# Patient Record
Sex: Female | Born: 1970 | Race: Black or African American | Hispanic: No | Marital: Married | Smoking: Never smoker
Health system: Southern US, Community
[De-identification: ages and names within clinical notes are randomized; demographics above are authoritative.]

## PROBLEM LIST (undated history)

## (undated) DIAGNOSIS — Z803 Family history of malignant neoplasm of breast: Secondary | ICD-10-CM

## (undated) DIAGNOSIS — Z801 Family history of malignant neoplasm of trachea, bronchus and lung: Secondary | ICD-10-CM

## (undated) DIAGNOSIS — Z923 Personal history of irradiation: Secondary | ICD-10-CM

## (undated) DIAGNOSIS — F909 Attention-deficit hyperactivity disorder, unspecified type: Secondary | ICD-10-CM

## (undated) DIAGNOSIS — F32A Depression, unspecified: Secondary | ICD-10-CM

## (undated) DIAGNOSIS — F329 Major depressive disorder, single episode, unspecified: Secondary | ICD-10-CM

## (undated) DIAGNOSIS — Z9221 Personal history of antineoplastic chemotherapy: Secondary | ICD-10-CM

## (undated) DIAGNOSIS — Z806 Family history of leukemia: Secondary | ICD-10-CM

## (undated) DIAGNOSIS — E119 Type 2 diabetes mellitus without complications: Secondary | ICD-10-CM

## (undated) DIAGNOSIS — J45909 Unspecified asthma, uncomplicated: Secondary | ICD-10-CM

## (undated) DIAGNOSIS — Z800 Family history of malignant neoplasm of digestive organs: Secondary | ICD-10-CM

## (undated) DIAGNOSIS — C801 Malignant (primary) neoplasm, unspecified: Secondary | ICD-10-CM

## (undated) HISTORY — DX: Type 2 diabetes mellitus without complications: E11.9

## (undated) HISTORY — PX: TUBAL LIGATION: SHX77

## (undated) HISTORY — DX: Family history of malignant neoplasm of digestive organs: Z80.0

## (undated) HISTORY — DX: Major depressive disorder, single episode, unspecified: F32.9

## (undated) HISTORY — DX: Family history of leukemia: Z80.6

## (undated) HISTORY — DX: Family history of malignant neoplasm of breast: Z80.3

## (undated) HISTORY — DX: Family history of malignant neoplasm of trachea, bronchus and lung: Z80.1

## (undated) HISTORY — DX: Depression, unspecified: F32.A

## (undated) HISTORY — DX: Attention-deficit hyperactivity disorder, unspecified type: F90.9

---

## 2011-08-11 ENCOUNTER — Encounter: Payer: Self-pay | Admitting: Family Medicine

## 2011-08-11 ENCOUNTER — Ambulatory Visit (INDEPENDENT_AMBULATORY_CARE_PROVIDER_SITE_OTHER): Payer: Managed Care, Other (non HMO) | Admitting: Family Medicine

## 2011-08-11 VITALS — BP 120/84 | HR 76 | Temp 98.5°F | Ht 64.0 in | Wt 158.8 lb

## 2011-08-11 DIAGNOSIS — F988 Other specified behavioral and emotional disorders with onset usually occurring in childhood and adolescence: Secondary | ICD-10-CM | POA: Insufficient documentation

## 2011-08-11 MED ORDER — METHYLPHENIDATE HCL ER (OSM) 18 MG PO TBCR: 18.0000 mg | EXTENDED_RELEASE_TABLET | ORAL | Status: DC

## 2011-08-11 MED ORDER — METHYLPHENIDATE HCL ER (OSM) 18 MG PO TBCR: 18.0000 mg | EXTENDED_RELEASE_TABLET | Freq: Every day | ORAL | Status: DC

## 2011-08-11 NOTE — Patient Instructions (Signed)
Attention Deficit Hyperactivity Disorder Attention deficit hyperactivity disorder (ADHD) is a problem with behavior issues based on the way the brain functions (neurobehavioral disorder). It is a common reason for behavior and academic problems in school. CAUSES  The cause of ADHD is unknown in most cases. It may run in families. It sometimes can be associated with learning disabilities and other behavioral problems. SYMPTOMS  There are 3 types of ADHD. The 3 types and some of the symptoms include:  Inattentive   Gets bored or distracted easily.   Loses or forgets things. Forgets to hand in homework.   Has trouble organizing or completing tasks.   Difficulty staying on task.   An inability to organize daily tasks and school work.   Leaving projects, chores, or homework unfinished.   Trouble paying attention or responding to details. Careless mistakes.   Difficulty following directions. Often seems like is not listening.   Dislikes activities that require sustained attention (like chores or homework).   Hyperactive-impulsive   Feels like it is impossible to sit still or stay in a seat. Fidgeting with hands and feet.   Trouble waiting turn.   Talking too much or out of turn. Interruptive.   Speaks or acts impulsively.   Aggressive, disruptive behavior.   Constantly busy or on the go, noisy.   Combined   Has symptoms of both of the above.  Often children with ADHD feel discouraged about themselves and with school. They often perform well below their abilities in school. These symptoms can cause problems in home, school, and in relationships with peers. As children get older, the excess motor activities can calm down, but the problems with paying attention and staying organized persist. Most children do not outgrow ADHD but with good treatment can learn to cope with the symptoms. DIAGNOSIS  When ADHD is suspected, the diagnosis should be made by professionals trained in  ADHD.  Diagnosis will include:  Ruling out other reasons for the child's behavior.   The caregivers will check with the child's school and check their medical records.   They will talk to teachers and parents.   Behavior rating scales for the child will be filled out by those dealing with the child on a daily basis.  A diagnosis is made only after all information has been considered. TREATMENT  Treatment usually includes behavioral treatment often along with medicines. It may include stimulant medicines. The stimulant medicines decrease impulsivity and hyperactivity and increase attention. Other medicines used include antidepressants and certain blood pressure medicines. Most experts agree that treatment for ADHD should address all aspects of the child's functioning. Treatment should not be limited to the use of medicines alone. Treatment should include structured classroom management. The parents must receive education to address rewarding good behavior, discipline, and limit-setting. Tutoring or behavioral therapy or both should be available for the child. If untreated, the disorder can have long-term serious effects into adolescence and adulthood. HOME CARE INSTRUCTIONS   Often with ADHD there is a lot of frustration among the family in dealing with the illness. There is often blame and anger that is not warranted. This is a life long illness. There is no way to prevent ADHD. In many cases, because the problem affects the family as a whole, the entire family may need help. A therapist can help the family find better ways to handle the disruptive behaviors and promote change. If the child is young, most of the therapist's work is with the parents. Parents will   learn techniques for coping with and improving their child's behavior. Sometimes only the child with the ADHD needs counseling. Your caregivers can help you make these decisions.   Children with ADHD may need help in organizing. Some  helpful tips include:   Keep routines the same every day from wake-up time to bedtime. Schedule everything. This includes homework and playtime. This should include outdoor and indoor recreation. Keep the schedule on the refrigerator or a bulletin board where it is frequently seen. Mark schedule changes as far in advance as possible.   Have a place for everything and keep everything in its place. This includes clothing, backpacks, and school supplies.   Encourage writing down assignments and bringing home needed books.   Offer your child a well-balanced diet. Breakfast is especially important for school performance. Children should avoid drinks with caffeine including:   Soft drinks.   Coffee.   Tea.   However, some older children (adolescents) may find these drinks helpful in improving their attention.   Children with ADHD need consistent rules that they can understand and follow. If rules are followed, give small rewards. Children with ADHD often receive, and expect, criticism. Look for good behavior and praise it. Set realistic goals. Give clear instructions. Look for activities that can foster success and self-esteem. Make time for pleasant activities with your child. Give lots of affection.   Parents are their children's greatest advocates. Learn as much as possible about ADHD. This helps you become a stronger and better advocate for your child. It also helps you educate your child's teachers and instructors if they feel inadequate in these areas. Parent support groups are often helpful. A national group with local chapters is called CHADD (Children and Adults with Attention Deficit Hyperactivity Disorder).  PROGNOSIS  There is no cure for ADHD. Children with the disorder seldom outgrow it. Many find adaptive ways to accommodate the ADHD as they mature. SEEK MEDICAL CARE IF:  Your child has repeated muscle twitches, cough or speech outbursts.   Your child has sleep problems.   Your  child has a marked loss of appetite.   Your child develops depression.   Your child has new or worsening behavioral problems.   Your child develops dizziness.   Your child has a racing heart.   Your child has stomach pains.   Your child develops headaches.  Document Released: 09/19/2002 Document Revised: 06/11/2011 Document Reviewed: 05/01/2008 Exit[employer] Patient Information 2012 [employer]. 

## 2011-08-11 NOTE — Assessment & Plan Note (Signed)
meds refilled rto 6 months for f/u and cpe

## 2011-08-11 NOTE — Progress Notes (Signed)
  Subjective:    Patient ID: Maria Kennedy, female    DOB: Feb 04, 1971, 40 y.o.   MRN: [id number]  HPI  Pt here to establish and get refills on meds.  No complaints.  Pt diagnosed with ADD in [place] and recently moved here.   Review of Systems As above    Objective:   Physical Exam  Constitutional: She is oriented to person, place, and time. She appears well-developed and well-nourished.  Cardiovascular: Normal rate, regular rhythm and normal heart sounds.   Pulmonary/Chest: Effort normal and breath sounds normal.  Neurological: She is alert and oriented to person, place, and time.  Psychiatric: She has a normal mood and affect. Her behavior is normal. Judgment and thought content normal.          Assessment & Plan:

## 2012-02-11 ENCOUNTER — Ambulatory Visit: Payer: Managed Care, Other (non HMO) | Admitting: Family Medicine

## 2012-02-11 DIAGNOSIS — Z0289 Encounter for other administrative examinations: Secondary | ICD-10-CM

## 2013-08-25 ENCOUNTER — Other Ambulatory Visit: Payer: Self-pay

## 2013-08-25 ENCOUNTER — Other Ambulatory Visit (HOSPITAL_COMMUNITY)
Admission: RE | Admit: 2013-08-25 | Discharge: 2013-08-25 | Disposition: A | Discharge status: Home / Self Care | Payer: Managed Care, Other (non HMO) | Source: Ambulatory Visit | Attending: Family Medicine | Admitting: Family Medicine

## 2013-08-25 DIAGNOSIS — Z01419 Encounter for gynecological examination (general) (routine) without abnormal findings: Secondary | ICD-10-CM | POA: Insufficient documentation

## 2018-11-03 ENCOUNTER — Other Ambulatory Visit: Payer: Self-pay | Admitting: Family Medicine

## 2018-11-03 ENCOUNTER — Other Ambulatory Visit (HOSPITAL_COMMUNITY)
Admission: RE | Admit: 2018-11-03 | Discharge: 2018-11-03 | Disposition: A | Discharge status: Home / Self Care | Payer: Managed Care, Other (non HMO) | Source: Ambulatory Visit | Attending: Family Medicine | Admitting: Family Medicine

## 2018-11-03 DIAGNOSIS — Z01411 Encounter for gynecological examination (general) (routine) with abnormal findings: Secondary | ICD-10-CM | POA: Insufficient documentation

## 2018-11-08 LAB — CYTOLOGY - PAP
Diagnosis: NEGATIVE
HPV: NOT DETECTED

## 2018-11-15 ENCOUNTER — Telehealth: Payer: Self-pay | Admitting: Neurology

## 2018-11-15 ENCOUNTER — Ambulatory Visit (INDEPENDENT_AMBULATORY_CARE_PROVIDER_SITE_OTHER): Payer: Managed Care, Other (non HMO) | Admitting: Neurology

## 2018-11-15 ENCOUNTER — Encounter: Payer: Self-pay | Admitting: Neurology

## 2018-11-15 VITALS — BP 123/80 | HR 77 | Ht 63.5 in | Wt 168.0 lb

## 2018-11-15 DIAGNOSIS — R200 Anesthesia of skin: Secondary | ICD-10-CM | POA: Diagnosis not present

## 2018-11-15 DIAGNOSIS — R42 Dizziness and giddiness: Secondary | ICD-10-CM | POA: Diagnosis not present

## 2018-11-15 DIAGNOSIS — H814 Vertigo of central origin: Secondary | ICD-10-CM

## 2018-11-15 DIAGNOSIS — H93A9 Pulsatile tinnitus, unspecified ear: Secondary | ICD-10-CM

## 2018-11-15 DIAGNOSIS — R269 Unspecified abnormalities of gait and mobility: Secondary | ICD-10-CM

## 2018-11-15 DIAGNOSIS — H8112 Benign paroxysmal vertigo, left ear: Secondary | ICD-10-CM | POA: Diagnosis not present

## 2018-11-15 DIAGNOSIS — Z8249 Family history of ischemic heart disease and other diseases of the circulatory system: Secondary | ICD-10-CM

## 2018-11-15 DIAGNOSIS — R202 Paresthesia of skin: Secondary | ICD-10-CM

## 2018-11-15 DIAGNOSIS — G441 Vascular headache, not elsewhere classified: Secondary | ICD-10-CM

## 2018-11-15 NOTE — Progress Notes (Signed)
[facility]    Provider:  Dr Jaynee Eagles Referring Provider: London Pepper, MD Primary Care Provider:  London Pepper, MD  CC:  Vertigo  HPI:  Maria Kennedy is a 48 y.o. female here as requested by provider Carollee Herter, Alferd Apa, * for positional vertigo. PMHx bilateral CTS and ADHD. The symptoms started 6 months ago, feeling dizzy and leabning to the side. Worse at night when turning to the left, world spins, brief, stopping movement makes it improve. Happens every night. 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 She was given exercises but she has not been doing them. She has an occ headaches not often, more migraines get better with a dark room, nothing significant a few times a year likely migraines. No nausea or vomiting, no falls, she has numbness and tingling on the left side possibly CTS. She had a brother who dies instantly of a burst cerebral aneurysm, he was having headaches. Patient reports pulsatile tinnitus of the left ear as well. She is scared because all the symptoms are happening on the left side. No other focal neurologic deficits, associated symptoms, inciting events or modifiable factors.  Reviewed notes, labs and imaging from outside physicians, which showed: Reviewed notes from a Dr. Orland Mustard, patient has carpal tunnel syndrome treated with prednisone for 5 days and wrist braces, she has numbness in the left hand, less neck pain than pain in the hand.  She is also having positional vertigo, dizziness is worse at nights when she lays on her left side.  Is going on for over 6 months.  Has not had any vestibular therapy or seen an ENT.  Dizziness can occur at any time, worse at night.  When she lay down suspends, ongoing for 6 months, lasts a few seconds, dizziness and spinning.  She also has a family history of aneurysm.`  She had bloodwork at Dr. Darien Ramus office I don;t have thos eresults I will request.  Review of Systems: Patient complains of symptoms per HPI  as well as the following symptoms: dizziness. Pertinent negatives and positives per HPI. All others negative.   Social History   Socioeconomic History  . Marital status: Married    Spouse name: Not on file  . Number of children: 2  . Years of education: Not on file  . Highest education level: Not on file  Occupational History  . Occupation: Human resources officer: [employer]  . Financial resource strain: Not on file  . Food insecurity:    Worry: Not on file    Inability: Not on file  . Transportation needs:    Medical: Not on file    Non-medical: Not on file  Tobacco Use  . Smoking status: Never Smoker  . Smokeless tobacco: Never Used  Substance and Sexual Activity  . Alcohol use: Yes    Comment: 1 beer 2-3 times per month  . Drug use: Never  . Sexual activity: Not Currently    Partners: Male    Birth control/protection: Surgical  Lifestyle  . Physical activity:    Days per week: Not on file    Minutes per session: Not on file  . Stress: Not on file  Relationships  . Social connections:    Talks on phone: Not on file    Gets together: Not on file    Attends religious service: Not on file    Active member of club or organization: Not on file    Attends meetings  of clubs or organizations: Not on file    Relationship status: Not on file  . Intimate partner violence:    Fear of current or ex partner: Not on file    Emotionally abused: Not on file    Physically abused: Not on file    Forced sexual activity: Not on file  Other Topics Concern  . Not on file  Social History Narrative   Lives at home with husband and child   Left handed   Caffeine: 1 cup of coffee in the mornings     Family History  Problem Relation Age of Onset  . Hypertension Mother   . Diabetes Mother   . Pneumonia Mother   . Lung cancer Father        smoker  . Other Father        vertigo  . Pancreatic cancer Paternal Grandmother   . Hypertension Maternal Grandmother     . Leukemia Maternal Grandfather   . Breast cancer Maternal Aunt   . Prostate cancer Paternal Uncle   . Aneurysm Brother 21       DECEASED    Past Medical History:  Diagnosis Date  . ADHD (attention deficit hyperactivity disorder)   . Depression     Patient Active Problem List   Diagnosis Date Noted  . Benign paroxysmal positional vertigo of left ear 11/15/2018  . ADD (attention deficit disorder) 08/11/2011    Past Surgical History:  Procedure Laterality Date  . TUBAL LIGATION      Current Outpatient Medications  Medication Sig Dispense Refill  . amphetamine-dextroamphetamine (ADDERALL XR) 20 MG 24 hr capsule Take 20 mg by mouth daily as needed.    Marland Kitchen VITAMIN D PO Take 5,000 Int'l Units by mouth every other day.     No current facility-administered medications for this visit.     Allergies as of 11/15/2018  . (No Known Allergies)    Vitals: BP 123/80 (BP Location: Right Arm, Patient Position: Sitting)   Pulse 77   Ht 5' 3.5" (1.613 m)   Wt 168 lb (76.2 kg)   BMI 29.29 kg/m  Last Weight:  Wt Readings from Last 1 Encounters:  11/15/18 168 lb (76.2 kg)   Last Height:   Ht Readings from Last 1 Encounters:  11/15/18 5' 3.5" (1.613 m)     Physical exam: Exam: Gen: NAD, conversant, well nourised, well groomed                     CV: RRR, no MRG. No Carotid Bruits. No peripheral edema, warm, nontender Eyes: Conjunctivae clear without exudates or hemorrhage  Neuro: Detailed Neurologic Exam  Speech:    Speech is normal; fluent and spontaneous with normal comprehension.  Cognition:    The patient is oriented to person, place, and time;     recent and remote memory intact;     language fluent;     normal attention, concentration,     fund of knowledge Cranial Nerves:    The pupils are equal, round, and reactive to light. The fundi are normal and spontaneous venous pulsations are present. Visual fields are full to finger confrontation. Extraocular movements  are intact. Trigeminal sensation is intact and the muscles of mastication are normal. The face is symmetric. The palate elevates in the midline. Hearing intact. Voice is normal. Shoulder shrug is normal. The tongue has normal motion without fasciculations.   Coordination:    Normal finger to nose and heel to shin. Normal  rapid alternating movements.   Gait:    Heel-toe and tandem gait are normal.   Motor Observation:    No asymmetry, no atrophy, and no involuntary movements noted. Tone:    Normal muscle tone.    Posture:    Posture is normal. normal erect    Strength:    Strength is V/V in the upper and lower limbs.      Sensation: intact to LT     Reflex Exam:  DTR's:    Deep tendon reflexes in the upper and lower extremities are normal bilaterally.   Toes:    The toes are downgoing bilaterally.   Clonus:    Clonus is absent.    Assessment/Plan: 48 year old with likely BPPV however given symptoms such as arm numbness, left pulsatile tinnitus, hx of brother who dies of burst cerebral aneurysm, vertigo need MRI of the brain w/wo contrast to evaluate for other causes such as aneurysm, vestibular schwannoma, stroke, or other lesions or vascular etiologies.  Orders Placed This Encounter  Procedures  . MR BRAIN W WO CONTRAST    Standing Status:   Future    Standing Expiration Date:   05/16/2019    Order Specific Question:   If indicated for the ordered procedure, I authorize the administration of contrast media per Radiology protocol    Answer:   Yes    Order Specific Question:   What is the patient's sedation requirement?    Answer:   No Sedation    Order Specific Question:   Does the patient have a pacemaker or implanted devices?    Answer:   No    Order Specific Question:   Radiology Contrast Protocol - do NOT remove file path    Answer:   \\[email or web address]    Order Specific Question:   Preferred imaging location?    Answer:   External  . MR MRA  HEAD WO CONTRAST    Standing Status:   Future    Standing Expiration Date:   01/14/2020    Order Specific Question:   What is the patient's sedation requirement?    Answer:   No Sedation    Order Specific Question:   Does the patient have a pacemaker or implanted devices?    Answer:   No    Order Specific Question:   Preferred imaging location?    Answer:   External    Order Specific Question:   Radiology Contrast Protocol - do NOT remove file path    Answer:   \\[email or web address]  . Ambulatory referral to Physical Therapy    Referral Priority:   Routine    Referral Type:   Physical Medicine    Referral Reason:   Specialty Services Required    Requested Specialty:   Physical Therapy    Number of Visits Requested:   1     Cc: London Pepper, MD  Sarina Ill, MD  [facility] [address] [place], [place] [address]  Phone [phone] Fax [phone]

## 2018-11-15 NOTE — Patient Instructions (Addendum)
Physical Therapy for Vestibular Therapy MRI of the brain and the blood vessels of the head   Benign Positional Vertigo Vertigo is the feeling that you or your surroundings are moving when they are not. Benign positional vertigo is the most common form of vertigo. This is usually a harmless condition (benign). This condition is positional. This means that symptoms are triggered by certain movements and positions. This condition can be dangerous if it occurs while you are doing something that could cause harm to you or others. This includes activities such as driving or operating machinery. What are the causes? In many cases, the cause of this condition is not known. It may be caused by a disturbance in an area of the inner ear that helps your brain to sense movement and balance. This disturbance can be caused by:  Viral infection (labyrinthitis).  Head injury.  Repetitive motion, such as jumping, dancing, or running. What increases the risk? You are more likely to develop this condition if:  You are a woman.  You are 60 years of age or older. What are the signs or symptoms? Symptoms of this condition usually happen when you move your head or your eyes in different directions. Symptoms may start suddenly, and usually last for less than a minute. They include:  Loss of balance and falling.  Feeling like you are spinning or moving.  Feeling like your surroundings are spinning or moving.  Nausea and vomiting.  Blurred vision.  Dizziness.  Involuntary eye movement (nystagmus). Symptoms can be mild and cause only minor problems, or they can be severe and interfere with daily life. Episodes of benign positional vertigo may return (recur) over time. Symptoms may improve over time. How is this diagnosed? This condition may be diagnosed based on:  Your medical history.  Physical exam of the head, neck, and ears.  Tests, such as: ? MRI. ? CT scan. ? Eye movement tests. Your health  care provider may ask you to change positions quickly while he or she watches you for symptoms of benign positional vertigo, such as nystagmus. Eye movement may be tested with a variety of exams that are designed to evaluate or stimulate vertigo. ? An electroencephalogram (EEG). This records electrical activity in your brain. ? Hearing tests. You may be referred to a health care provider who specializes in ear, nose, and throat (ENT) problems (otolaryngologist) or a provider who specializes in disorders of the nervous system (neurologist). How is this treated?  This condition may be treated in a session in which your health care provider moves your head in specific positions to adjust your inner ear back to normal. Treatment for this condition may take several sessions. Surgery may be needed in severe cases, but this is rare. In some cases, benign positional vertigo may resolve on its own in 2-4 weeks. Follow these instructions at home: Safety  Move slowly. Avoid sudden body or head movements or certain positions, as told by your health care provider.  Avoid driving until your health care provider says it is safe for you to do so.  Avoid operating heavy machinery until your health care provider says it is safe for you to do so.  Avoid doing any tasks that would be dangerous to you or others if vertigo occurs.  If you have trouble walking or keeping your balance, try using a cane for stability. If you feel dizzy or unstable, sit down right away.  Return to your normal activities as told by your health  care provider. Ask your health care provider what activities are safe for you. General instructions  Take over-the-counter and prescription medicines only as told by your health care provider.  Drink enough fluid to keep your urine pale yellow.  Keep all follow-up visits as told by your health care provider. This is important. Contact a health care provider if:  You have a fever.  Your  condition gets worse or you develop new symptoms.  Your family or friends notice any behavioral changes.  You have nausea or vomiting that gets worse.  You have numbness or a "pins and needles" sensation. Get help right away if you:  Have difficulty speaking or moving.  Are always dizzy.  Faint.  Develop severe headaches.  Have weakness in your legs or arms.  Have changes in your hearing or vision.  Develop a stiff neck.  Develop sensitivity to light. Summary  Vertigo is the feeling that you or your surroundings are moving when they are not. Benign positional vertigo is the most common form of vertigo.  The cause of this condition is not known. It may be caused by a disturbance in an area of the inner ear that helps your brain to sense movement and balance.  Symptoms include loss of balance and falling, feeling that you or your surroundings are moving, nausea and vomiting, and blurred vision.  This condition can be diagnosed based on symptoms, physical exam, and other tests, such as MRI, CT scan, eye movement tests, and hearing tests.  Follow safety instructions as told by your health care provider. You will also be told when to contact your health care provider in case of problems. This information is not intended to replace advice given to you by your health care provider. Make sure you discuss any questions you have with your health care provider. Document Released: 07/07/2006 Document Revised: 03/10/2018 Document Reviewed: 03/10/2018 Elsevier Interactive Patient Education  2019 [employer].

## 2018-11-15 NOTE — Telephone Encounter (Signed)
[employer] order sent to [facility] they will obtain the auth and reach out to the pt to schedule. Pt is aware

## 2018-12-02 ENCOUNTER — Inpatient Hospital Stay: Admission: RE | Admit: 2018-12-02 | Payer: Managed Care, Other (non HMO) | Source: Ambulatory Visit

## 2018-12-02 ENCOUNTER — Other Ambulatory Visit: Payer: Managed Care, Other (non HMO)

## 2018-12-06 NOTE — Telephone Encounter (Signed)
Novella Rob: [id number] (exp. 11/29/18 to 02/27/19) patient is scheduled at [facility] for 12/16/18

## 2018-12-16 ENCOUNTER — Ambulatory Visit
Admission: RE | Admit: 2018-12-16 | Discharge: 2018-12-16 | Disposition: A | Discharge status: Home / Self Care | Payer: Managed Care, Other (non HMO) | Source: Ambulatory Visit | Attending: Neurology | Admitting: Neurology

## 2018-12-16 DIAGNOSIS — R200 Anesthesia of skin: Secondary | ICD-10-CM | POA: Diagnosis not present

## 2018-12-16 DIAGNOSIS — Z8249 Family history of ischemic heart disease and other diseases of the circulatory system: Secondary | ICD-10-CM

## 2018-12-16 DIAGNOSIS — G441 Vascular headache, not elsewhere classified: Secondary | ICD-10-CM

## 2018-12-16 DIAGNOSIS — H93A9 Pulsatile tinnitus, unspecified ear: Secondary | ICD-10-CM

## 2018-12-16 DIAGNOSIS — R269 Unspecified abnormalities of gait and mobility: Secondary | ICD-10-CM

## 2018-12-16 DIAGNOSIS — R42 Dizziness and giddiness: Secondary | ICD-10-CM

## 2018-12-16 DIAGNOSIS — R202 Paresthesia of skin: Secondary | ICD-10-CM

## 2018-12-16 DIAGNOSIS — H814 Vertigo of central origin: Secondary | ICD-10-CM

## 2018-12-16 MED ORDER — GADOBENATE DIMEGLUMINE 529 MG/ML IV SOLN
15.0000 mL | Freq: Once | INTRAVENOUS | Status: AC | PRN
  Administered 2018-12-16: 15 mL via INTRAVENOUS

## 2018-12-17 ENCOUNTER — Telehealth: Payer: Self-pay | Admitting: Neurology

## 2018-12-17 NOTE — Telephone Encounter (Signed)
Called the patient but there was no answer. Unable to leave a message due to no voicemail set up. Will attempt to call the patient again.  **If patient calls back please advise that we were calling to let her know that Dr Jaynee Eagles reviewed her MRI and MRA of her head and both were normal and didn't show anything concerning.

## 2018-12-17 NOTE — Telephone Encounter (Signed)
-----   Message from Melvenia Beam, MD sent at 12/16/2018  4:28 PM EST ----- MRI of the brain and MRA of the head are normal. Thanks

## 2018-12-17 NOTE — Telephone Encounter (Signed)
Patient returned call and was advised of MRI and MRA

## 2018-12-31 ENCOUNTER — Ambulatory Visit: Payer: Managed Care, Other (non HMO) | Admitting: Rehabilitative and Restorative Service Providers"

## 2019-09-07 ENCOUNTER — Other Ambulatory Visit: Payer: Self-pay

## 2019-09-07 DIAGNOSIS — Z20822 Contact with and (suspected) exposure to covid-19: Secondary | ICD-10-CM

## 2019-09-09 LAB — NOVEL CORONAVIRUS, NAA: SARS-CoV-2, NAA: DETECTED — AB

## 2019-09-12 ENCOUNTER — Encounter: Payer: Self-pay | Admitting: *Deleted

## 2019-09-20 ENCOUNTER — Other Ambulatory Visit: Payer: Self-pay

## 2019-09-20 DIAGNOSIS — Z20822 Contact with and (suspected) exposure to covid-19: Secondary | ICD-10-CM

## 2019-09-22 LAB — NOVEL CORONAVIRUS, NAA: SARS-CoV-2, NAA: NOT DETECTED

## 2019-09-26 ENCOUNTER — Other Ambulatory Visit: Payer: Self-pay | Admitting: Family Medicine

## 2019-09-26 ENCOUNTER — Other Ambulatory Visit: Payer: Self-pay

## 2019-09-26 ENCOUNTER — Ambulatory Visit
Admission: RE | Admit: 2019-09-26 | Discharge: 2019-09-26 | Disposition: A | Discharge status: Home / Self Care | Payer: Managed Care, Other (non HMO) | Source: Ambulatory Visit | Attending: Family Medicine | Admitting: Family Medicine

## 2019-09-26 DIAGNOSIS — R079 Chest pain, unspecified: Secondary | ICD-10-CM

## 2019-11-21 ENCOUNTER — Other Ambulatory Visit: Payer: Self-pay | Admitting: Family Medicine

## 2019-11-21 DIAGNOSIS — Z1231 Encounter for screening mammogram for malignant neoplasm of breast: Secondary | ICD-10-CM

## 2019-11-23 ENCOUNTER — Encounter: Payer: Self-pay | Admitting: Pulmonary Disease

## 2019-11-23 ENCOUNTER — Ambulatory Visit (INDEPENDENT_AMBULATORY_CARE_PROVIDER_SITE_OTHER): Payer: Managed Care, Other (non HMO)

## 2019-11-23 ENCOUNTER — Other Ambulatory Visit: Payer: Self-pay

## 2019-11-23 ENCOUNTER — Ambulatory Visit (INDEPENDENT_AMBULATORY_CARE_PROVIDER_SITE_OTHER): Payer: 59 | Admitting: Pulmonary Disease

## 2019-11-23 VITALS — BP 124/84 | HR 96 | Temp 97.0°F | Ht 63.5 in | Wt 175.2 lb

## 2019-11-23 DIAGNOSIS — R0602 Shortness of breath: Secondary | ICD-10-CM

## 2019-11-23 MED ORDER — BENZONATATE 200 MG PO CAPS: 200.0000 mg | ORAL_CAPSULE | Freq: Three times a day (TID) | ORAL | 1 refills | Status: DC | PRN

## 2019-11-23 NOTE — Patient Instructions (Signed)
Recent Covid  Bronchitis  Your chest x-ray is completely normal which is reassuring  Symptoms should continue to improve and resolve in a few weeks  Continue using albuterol as needed I did send you in a prescription for benzonatate -If this is not working for you then using Mucinex/guaifenesin will be appropriate  Call if significant symptoms  We will see you as needed

## 2019-11-23 NOTE — Progress Notes (Signed)
Subjective:    Patient ID: Maria Kennedy, female    DOB: Sep 24, 1971, 49 y.o.   MRN: [id number]  Patient being seen for shortness of breath  She was diagnosed with Covid 09/10/2019  She did not get significantly sick Did not require hospitalization  Shortness of breath with cough, sputum production, wheezing Symptoms improved with use of albuterol  She has noticed some improvement in symptoms  Prior to the visit today we did get a chest x-ray which was compared to one performed in December 2020-no significant changes Lung volumes are large-used to do track  Has no underlying lung disease known to, no history of asthma  Past Medical History:  Diagnosis Date  . ADHD (attention deficit hyperactivity disorder)   . Depression    Social History   Socioeconomic History  . Marital status: Married    Spouse name: Not on file  . Number of children: 2  . Years of education: Not on file  . Highest education level: Not on file  Occupational History  . Occupation: Human resources officer: [employer]  Tobacco Use  . Smoking status: Never Smoker  . Smokeless tobacco: Never Used  Substance and Sexual Activity  . Alcohol use: Yes    Comment: 1 beer 2-3 times per month  . Drug use: Never  . Sexual activity: Not Currently    Partners: Male    Birth control/protection: Surgical  Other Topics Concern  . Not on file  Social History Narrative   Lives at home with husband and child   Left handed   Caffeine: 1 cup of coffee in the mornings    Social Determinants of Health   Financial Resource Strain:   . Difficulty of Paying Living Expenses: Not on file  Food Insecurity:   . Worried About Charity fundraiser in the Last Year: Not on file  . Ran Out of Food in the Last Year: Not on file  Transportation Needs:   . Lack of Transportation (Medical): Not on file  . Lack of Transportation (Non-Medical): Not on file  Physical Activity:   . Days of Exercise per Week: Not on file    . Minutes of Exercise per Session: Not on file  Stress:   . Feeling of Stress : Not on file  Social Connections:   . Frequency of Communication with Friends and Family: Not on file  . Frequency of Social Gatherings with Friends and Family: Not on file  . Attends Religious Services: Not on file  . Active Member of Clubs or Organizations: Not on file  . Attends Archivist Meetings: Not on file  . Marital Status: Not on file  Intimate Partner Violence:   . Fear of Current or Ex-Partner: Not on file  . Emotionally Abused: Not on file  . Physically Abused: Not on file  . Sexually Abused: Not on file   Family History  Problem Relation Age of Onset  . Hypertension Mother   . Diabetes Mother   . Pneumonia Mother   . Lung cancer Father        smoker  . Other Father        vertigo  . Pancreatic cancer Paternal Grandmother   . Hypertension Maternal Grandmother   . Leukemia Maternal Grandfather   . Breast cancer Maternal Aunt   . Prostate cancer Paternal Uncle   . Aneurysm Brother 21       DECEASED   Review of Systems  Constitutional: Negative for  fever and unexpected weight change.  HENT: Positive for congestion and rhinorrhea. Negative for dental problem, ear pain, nosebleeds, postnasal drip, sinus pressure, sneezing, sore throat and trouble swallowing.   Eyes: Positive for itching. Negative for redness.  Respiratory: Positive for cough, chest tightness, shortness of breath and wheezing.   Cardiovascular: Negative for palpitations and leg swelling.  Gastrointestinal: Negative for nausea and vomiting.  Genitourinary: Negative for dysuria.  Musculoskeletal: Negative for joint swelling.  Skin: Negative for rash.  Allergic/Immunologic: Negative.  Negative for environmental allergies, food allergies and immunocompromised state.  Neurological: Positive for headaches.  Hematological: Does not bruise/bleed easily.  Psychiatric/Behavioral: Positive for dysphoric mood. The  patient is nervous/anxious.        Objective:   Physical Exam Constitutional:      Appearance: She is obese.  HENT:     Head: Normocephalic.     Nose: Nose normal.     Mouth/Throat:     Mouth: Mucous membranes are moist.     Pharynx: No oropharyngeal exudate.  Eyes:     Extraocular Movements: Extraocular movements intact.     Pupils: Pupils are equal, round, and reactive to light.  Cardiovascular:     Rate and Rhythm: Normal rate and regular rhythm.     Pulses: Normal pulses.     Heart sounds: Normal heart sounds. No murmur. No friction rub.  Pulmonary:     Effort: Pulmonary effort is normal. No respiratory distress.     Breath sounds: Normal breath sounds. No stridor. No wheezing or rhonchi.  Musculoskeletal:     Cervical back: Normal range of motion and neck supple. No rigidity or tenderness.  Skin:    General: Skin is warm.  Neurological:     General: No focal deficit present.     Mental Status: She is alert.  Psychiatric:        Mood and Affect: Mood normal.    Vitals:   11/23/19 1018  BP: 124/84  Pulse: 96  Temp: (!) 97 F (36.1 C)  SpO2: 99%      Assessment & Plan:  .  Shortness of breath  .  Post recent Covid infection  .  Bronchitis  Appears to be feeling better generally Albuterol seems to help symptoms  Plan .  Continue albuterol use as needed  .  Graded exercise get back to usual  .  Chest x-ray performed today was reviewed with the patient showing no new infiltrative process, comparable with previous x-ray performed in December  .  Reassurance  .  PFT and further work-up if nonresolution of symptoms after a few weeks  .  Encouraged to call with any significant concerns  .  We will only see as needed

## 2020-01-02 ENCOUNTER — Ambulatory Visit: Payer: Managed Care, Other (non HMO)

## 2020-03-30 ENCOUNTER — Other Ambulatory Visit: Payer: Self-pay

## 2020-03-30 ENCOUNTER — Ambulatory Visit: Payer: 59

## 2020-03-30 ENCOUNTER — Ambulatory Visit
Admission: RE | Admit: 2020-03-30 | Discharge: 2020-03-30 | Disposition: A | Discharge status: Home / Self Care | Payer: 59 | Source: Ambulatory Visit | Attending: Family Medicine | Admitting: Family Medicine

## 2020-03-30 DIAGNOSIS — Z1231 Encounter for screening mammogram for malignant neoplasm of breast: Secondary | ICD-10-CM

## 2020-10-13 HISTORY — PX: MASTECTOMY: SHX3

## 2020-12-03 ENCOUNTER — Other Ambulatory Visit: Payer: Self-pay | Admitting: Family Medicine

## 2020-12-03 DIAGNOSIS — N63 Unspecified lump in unspecified breast: Secondary | ICD-10-CM

## 2020-12-12 ENCOUNTER — Other Ambulatory Visit: Payer: Self-pay

## 2020-12-12 ENCOUNTER — Ambulatory Visit
Admission: RE | Admit: 2020-12-12 | Discharge: 2020-12-12 | Disposition: A | Discharge status: Home / Self Care | Payer: 59 | Source: Ambulatory Visit | Attending: Family Medicine | Admitting: Family Medicine

## 2020-12-12 ENCOUNTER — Other Ambulatory Visit: Payer: Self-pay | Admitting: Family Medicine

## 2020-12-12 DIAGNOSIS — N63 Unspecified lump in unspecified breast: Secondary | ICD-10-CM

## 2020-12-17 ENCOUNTER — Ambulatory Visit
Admission: RE | Admit: 2020-12-17 | Discharge: 2020-12-17 | Disposition: A | Discharge status: Home / Self Care | Payer: 59 | Source: Ambulatory Visit | Attending: Family Medicine | Admitting: Family Medicine

## 2020-12-17 ENCOUNTER — Other Ambulatory Visit: Payer: Self-pay

## 2020-12-17 DIAGNOSIS — N63 Unspecified lump in unspecified breast: Secondary | ICD-10-CM

## 2020-12-18 ENCOUNTER — Other Ambulatory Visit: Payer: Self-pay | Admitting: Family Medicine

## 2020-12-18 DIAGNOSIS — R2232 Localized swelling, mass and lump, left upper limb: Secondary | ICD-10-CM

## 2020-12-18 DIAGNOSIS — Z853 Personal history of malignant neoplasm of breast: Secondary | ICD-10-CM

## 2020-12-19 ENCOUNTER — Ambulatory Visit
Admission: RE | Admit: 2020-12-19 | Discharge: 2020-12-19 | Disposition: A | Discharge status: Home / Self Care | Payer: 59 | Source: Ambulatory Visit | Attending: Family Medicine | Admitting: Family Medicine

## 2020-12-19 ENCOUNTER — Other Ambulatory Visit: Payer: Self-pay

## 2020-12-19 ENCOUNTER — Other Ambulatory Visit: Payer: Self-pay | Admitting: Nurse Practitioner

## 2020-12-19 ENCOUNTER — Telehealth: Payer: Self-pay | Admitting: Hematology and Oncology

## 2020-12-19 DIAGNOSIS — Z853 Personal history of malignant neoplasm of breast: Secondary | ICD-10-CM

## 2020-12-19 DIAGNOSIS — C50919 Malignant neoplasm of unspecified site of unspecified female breast: Secondary | ICD-10-CM

## 2020-12-19 DIAGNOSIS — R2232 Localized swelling, mass and lump, left upper limb: Secondary | ICD-10-CM

## 2020-12-19 NOTE — Telephone Encounter (Signed)
Spoke to patient to confirm afternoon [facility] appointment for 3/16, packet emailed to patient

## 2020-12-20 ENCOUNTER — Encounter: Payer: Self-pay | Admitting: *Deleted

## 2020-12-23 ENCOUNTER — Ambulatory Visit
Admission: RE | Admit: 2020-12-23 | Discharge: 2020-12-23 | Disposition: A | Discharge status: Home / Self Care | Payer: 59 | Source: Ambulatory Visit | Attending: Nurse Practitioner | Admitting: Nurse Practitioner

## 2020-12-23 DIAGNOSIS — C50919 Malignant neoplasm of unspecified site of unspecified female breast: Secondary | ICD-10-CM

## 2020-12-23 MED ORDER — GADOBUTROL 1 MMOL/ML IV SOLN
7.0000 mL | Freq: Once | INTRAVENOUS | Status: AC | PRN
  Administered 2020-12-23: 7 mL via INTRAVENOUS

## 2020-12-24 ENCOUNTER — Other Ambulatory Visit: Payer: Self-pay | Admitting: *Deleted

## 2020-12-24 DIAGNOSIS — C50412 Malignant neoplasm of upper-outer quadrant of left female breast: Secondary | ICD-10-CM | POA: Insufficient documentation

## 2020-12-24 DIAGNOSIS — Z171 Estrogen receptor negative status [ER-]: Secondary | ICD-10-CM

## 2020-12-25 NOTE — Progress Notes (Signed)
[facility] NOTE  Patient Care Team: London Pepper, MD as PCP - General (Family Medicine) Mauro Kaufmann, RN as Oncology Nurse Navigator Rockwell Germany, RN as Oncology Nurse Navigator Rolm Bookbinder, MD as Consulting Physician (General Surgery) Nicholas Lose, MD as Consulting Physician (Hematology and Oncology) Eppie Gibson, MD as Attending Physician (Radiation Oncology)  CHIEF COMPLAINTS/PURPOSE OF CONSULTATION:  Newly diagnosed breast cancer  HISTORY OF PRESENTING ILLNESS:  Maria Kennedy 50 y.o. female is here because of recent diagnosis of invasive ductal carcinoma of the left breast. Patient palpated a left breast mass and noted left axilla tenderness for one week. Diagnostic mammogram and [place] on 12/12/20 showed a conglomeration of masses in the left breast at the 2 o'clock position 3-4cm from the nipple spanning 3.9cm total, with a 0.7cm mass at the 2 o'clock position 6cm from the nipple, and two abnormal axillary lymph nodes. 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 She presents to the clinic today for initial evaluation and discussion of treatment options.   I reviewed her records extensively and collaborated the history with the patient.  SUMMARY OF ONCOLOGIC HISTORY: Oncology History  Malignant neoplasm of upper-outer quadrant of left breast in female, estrogen receptor negative ([place])  12/19/2020 Initial Diagnosis   Patient palpated a left breast mass and noted left axilla tenderness for one week. Diagnostic mammogram and US showed a conglomeration of masses in the left breast at the 2 o'clock position 3-4cm from the nipple spanning 3.9cm total, with a 0.7cm mass at the 2 o'clock position 6cm from the nipple, and two abnormal axillary lymph nodes. 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   12/26/2020 Cancer  Staging   Staging form: Breast, AJCC 8th Edition - Clinical stage from 12/26/2020: Stage IIIA (cT3, cN1, cM0, G3, ER-, PR-, HER2+) - Signed by Nicholas Lose, MD on 12/26/2020 Stage prefix: Initial diagnosis Histologic grading system: 3 grade system     MEDICAL HISTORY:  Past Medical History:  Diagnosis Date  . ADHD (attention deficit hyperactivity disorder)   . Depression     SURGICAL HISTORY: Past Surgical History:  Procedure Laterality Date  . TUBAL LIGATION      SOCIAL HISTORY: Social History   Socioeconomic History  . Marital status: Married    Spouse name: Not on file  . Number of children: 2  . Years of education: Not on file  . Highest education level: Not on file  Occupational History  . Occupation: Human resources officer: [employer]  Tobacco Use  . Smoking status: Never Smoker  . Smokeless tobacco: Never Used  Vaping Use  . Vaping Use: Never used  Substance and Sexual Activity  . Alcohol use: Yes    Comment: 1 beer 2-3 times per month  . Drug use: Never  . Sexual activity: Not Currently    Partners: Male    Birth control/protection: Surgical  Other Topics Concern  . Not on file  Social History Narrative   Lives at home with husband and child   Left handed   Caffeine: 1 cup of coffee in the mornings    Social Determinants of Health   Financial Resource Strain: Not on file  Food Insecurity: Not on file  Transportation Needs: Not on file  Physical Activity: Not on file  Stress: Not on file  Social Connections: Not on file  Intimate Partner Violence: Not on file    FAMILY HISTORY: Family History  Problem Relation Age of Onset  . Hypertension Mother   . Diabetes Mother   . Pneumonia Mother   . Lung cancer Father        smoker  . Other Father        vertigo  . Pancreatic cancer Paternal Grandmother   . Hypertension Maternal Grandmother   . Leukemia Maternal Grandfather   . Breast cancer Maternal Aunt   . Prostate cancer Paternal Uncle    . Aneurysm Brother 21       DECEASED  . Breast cancer Cousin        3 cousins    ALLERGIES:  is allergic to latex.  MEDICATIONS:  Current Outpatient Medications  Medication Sig Dispense Refill  . acetaminophen (TYLENOL) 650 MG CR tablet Take 650 mg by mouth every 8 (eight) hours as needed for pain.    Marland Kitchen amphetamine-dextroamphetamine (ADDERALL XR) 20 MG 24 hr capsule Take 20 mg by mouth daily as needed.    . ferrous sulfate 325 (65 FE) MG EC tablet Take 325 mg by mouth in the morning and at bedtime.    Marland Kitchen LORazepam (ATIVAN) 0.5 MG tablet Take 0.5 mg by mouth at bedtime.    . Menaquinone-7 (VITAMIN K2) 40 MCG TABS Take 2 tablets by mouth daily. 80 mcg daily total    . VITAMIN D PO Take 5,000 Int'l Units by mouth every other day.     No current facility-administered medications for this visit.    REVIEW OF SYSTEMS:   As above all other systems negative  PHYSICAL EXAMINATION: ECOG PERFORMANCE STATUS: 1 - Symptomatic but completely ambulatory  Vitals:   12/26/20 1325  BP: (!) 157/93  Pulse: (!) 118  Resp: 18  Temp: (!) 97.3 F (36.3 C)  SpO2: 100%   Filed Weights   12/26/20 1325  Weight: 170 lb 1.6 oz (77.2 kg)        LABORATORY DATA:  I have reviewed the data as listed Lab Results  Component Value Date   WBC 7.0 12/26/2020   HGB 11.8 (L) 12/26/2020   HCT 37.3 12/26/2020   MCV 87.1 12/26/2020   PLT 410 (H) 12/26/2020   Lab Results  Component Value Date   NA 139 12/26/2020   K 3.8 12/26/2020   CL 105 12/26/2020   CO2 26 12/26/2020    RADIOGRAPHIC STUDIES: I have personally reviewed the radiological reports and agreed with the findings in the report.  ASSESSMENT AND PLAN:  Malignant neoplasm of upper-outer quadrant of left breast in female, estrogen receptor negative ([place]) 12/19/2020: Palpable left breast mass and left axillary tenderness.  Mammogram revealed conglomeration of masses 3.9 cm and an adjacent 0.7 cm mass and 2 axillary lymph nodes that were  abnormal. Biopsy revealed IDC with DCIS, grade 3, ER/PR negative, HER-2 +3+ by [facility]  12/24/2020: Breast MRI revealed mass or non-mass enhancement measuring 9.5 cm and 3 abnormal lymph nodes  Pathology and radiology counseling: Discussed with the patient, the details of pathology including the type of breast cancer,the clinical staging, the significance of ER, PR and HER-2/neu receptors and the implications for treatment. After reviewing the pathology in detail, we proceeded to discuss the different treatment options between surgery, radiation, chemotherapy, antiestrogen therapies.  Recommendation based on multidisciplinary tumor board: 1. Neoadjuvant chemotherapy with TCH Perjeta 6 cycles followed by Herceptin Perjeta maintenance versus Kadcyla maintenance (based on response to neoadjuvant chemo) for 1 year  2. Followed by breast conserving surgery and targeted lymph node surgery 3. Followed by adjuvant radiation therapy   We will also perform staging scans.  Chemotherapy Counseling: I discussed the risks and benefits of chemotherapy including the risks of nausea/ vomiting, risk of infection from low WBC count, fatigue due to chemo or anemia, bruising or bleeding due to low platelets, mouth sores, loss/ change in taste and decreased appetite. Liver and kidney function will be monitored through out chemotherapy as abnormalities in liver and kidney function may be a side effect of treatment. Cardiac dysfunction due to Herceptin and Perjeta and neuropathy risk from Taxotere were discussed in detail. Risk of permanent bone marrow dysfunction due to chemo were also discussed.  Plan: 1. Port placement 2. Echocardiogram 3. Chemotherapy class 4. Breast MRI  [facility] nausea study  She and her husband have 5 children.  Her youngest child is 101 year old she works from home. She may be applying for short-term disability.  Return to clinic in 2 weeks to start chemotherapy.    All questions were answered.  The patient knows to call the clinic with any problems, questions or concerns.   Rulon Eisenmenger, MD, MPH 12/26/2020    I, Molly Dorshimer, am acting as scribe for Nicholas Lose, MD.  I have reviewed the above documentation for accuracy and completeness, and I agree with the above.

## 2020-12-26 ENCOUNTER — Encounter: Payer: Self-pay | Admitting: Radiation Oncology

## 2020-12-26 ENCOUNTER — Ambulatory Visit
Admission: RE | Admit: 2020-12-26 | Discharge: 2020-12-26 | Disposition: A | Discharge status: Home / Self Care | Payer: 59 | Source: Ambulatory Visit | Attending: Radiation Oncology | Admitting: Radiation Oncology

## 2020-12-26 ENCOUNTER — Encounter: Payer: Self-pay | Admitting: Radiology

## 2020-12-26 ENCOUNTER — Ambulatory Visit (HOSPITAL_BASED_OUTPATIENT_CLINIC_OR_DEPARTMENT_OTHER): Payer: 59 | Admitting: Genetic Counselor

## 2020-12-26 ENCOUNTER — Ambulatory Visit: Payer: 59 | Admitting: Physical Therapy

## 2020-12-26 ENCOUNTER — Other Ambulatory Visit: Payer: Self-pay

## 2020-12-26 ENCOUNTER — Encounter: Payer: Self-pay | Admitting: *Deleted

## 2020-12-26 ENCOUNTER — Other Ambulatory Visit: Payer: Self-pay | Admitting: General Surgery

## 2020-12-26 ENCOUNTER — Inpatient Hospital Stay (HOSPITAL_BASED_OUTPATIENT_CLINIC_OR_DEPARTMENT_OTHER): Payer: 59 | Admitting: Hematology and Oncology

## 2020-12-26 ENCOUNTER — Inpatient Hospital Stay: Payer: 59 | Attending: Hematology and Oncology

## 2020-12-26 VITALS — BP 157/93 | HR 118 | Temp 97.3°F | Resp 18 | Ht 63.5 in | Wt 170.1 lb

## 2020-12-26 DIAGNOSIS — Z800 Family history of malignant neoplasm of digestive organs: Secondary | ICD-10-CM | POA: Diagnosis not present

## 2020-12-26 DIAGNOSIS — Z5189 Encounter for other specified aftercare: Secondary | ICD-10-CM | POA: Diagnosis not present

## 2020-12-26 DIAGNOSIS — Z5111 Encounter for antineoplastic chemotherapy: Secondary | ICD-10-CM | POA: Insufficient documentation

## 2020-12-26 DIAGNOSIS — Z803 Family history of malignant neoplasm of breast: Secondary | ICD-10-CM

## 2020-12-26 DIAGNOSIS — R293 Abnormal posture: Secondary | ICD-10-CM | POA: Insufficient documentation

## 2020-12-26 DIAGNOSIS — C773 Secondary and unspecified malignant neoplasm of axilla and upper limb lymph nodes: Secondary | ICD-10-CM

## 2020-12-26 DIAGNOSIS — C50412 Malignant neoplasm of upper-outer quadrant of left female breast: Secondary | ICD-10-CM | POA: Insufficient documentation

## 2020-12-26 DIAGNOSIS — Z171 Estrogen receptor negative status [ER-]: Secondary | ICD-10-CM

## 2020-12-26 DIAGNOSIS — M25512 Pain in left shoulder: Secondary | ICD-10-CM | POA: Insufficient documentation

## 2020-12-26 DIAGNOSIS — Z5112 Encounter for antineoplastic immunotherapy: Secondary | ICD-10-CM | POA: Insufficient documentation

## 2020-12-26 DIAGNOSIS — Z806 Family history of leukemia: Secondary | ICD-10-CM | POA: Diagnosis not present

## 2020-12-26 DIAGNOSIS — Z801 Family history of malignant neoplasm of trachea, bronchus and lung: Secondary | ICD-10-CM | POA: Diagnosis not present

## 2020-12-26 LAB — CBC WITH DIFFERENTIAL (CANCER CENTER ONLY)
Abs Immature Granulocytes: 0.01 10*3/uL (ref 0.00–0.07)
Basophils Absolute: 0 10*3/uL (ref 0.0–0.1)
Basophils Relative: 0 %
Eosinophils Absolute: 0 10*3/uL (ref 0.0–0.5)
Eosinophils Relative: 0 %
HCT: 37.3 % (ref 36.0–46.0)
Hemoglobin: 11.8 g/dL — ABNORMAL LOW (ref 12.0–15.0)
Immature Granulocytes: 0 %
Lymphocytes Relative: 31 %
Lymphs Abs: 2.1 10*3/uL (ref 0.7–4.0)
MCH: 27.6 pg (ref 26.0–34.0)
MCHC: 31.6 g/dL (ref 30.0–36.0)
MCV: 87.1 fL (ref 80.0–100.0)
Monocytes Absolute: 0.4 10*3/uL (ref 0.1–1.0)
Monocytes Relative: 6 %
Neutro Abs: 4.4 10*3/uL (ref 1.7–7.7)
Neutrophils Relative %: 63 %
Platelet Count: 410 10*3/uL — ABNORMAL HIGH (ref 150–400)
RBC: 4.28 MIL/uL (ref 3.87–5.11)
RDW: 16.1 % — ABNORMAL HIGH (ref 11.5–15.5)
WBC Count: 7 10*3/uL (ref 4.0–10.5)
nRBC: 0 % (ref 0.0–0.2)

## 2020-12-26 LAB — CMP (CANCER CENTER ONLY)
ALT: 26 U/L (ref 0–44)
AST: 19 U/L (ref 15–41)
Albumin: 4.4 g/dL (ref 3.5–5.0)
Alkaline Phosphatase: 50 U/L (ref 38–126)
Anion gap: 8 (ref 5–15)
BUN: 9 mg/dL (ref 6–20)
CO2: 26 mmol/L (ref 22–32)
Calcium: 9.3 mg/dL (ref 8.9–10.3)
Chloride: 105 mmol/L (ref 98–111)
Creatinine: 0.9 mg/dL (ref 0.44–1.00)
GFR, Estimated: >60 mL/min (ref >60)
Glucose, Bld: 119 mg/dL — ABNORMAL HIGH (ref 70–99)
Potassium: 3.8 mmol/L (ref 3.5–5.1)
Sodium: 139 mmol/L (ref 135–145)
Total Bilirubin: 0.4 mg/dL (ref 0.3–1.2)
Total Protein: 7.6 g/dL (ref 6.5–8.1)

## 2020-12-26 LAB — GENETIC SCREENING ORDER

## 2020-12-26 MED ORDER — DEXAMETHASONE 4 MG PO TABS: 4.0000 mg | ORAL_TABLET | Freq: Every day | ORAL | 0 refills | Status: DC

## 2020-12-26 MED ORDER — LIDOCAINE-PRILOCAINE 2.5-2.5 % EX CREA: TOPICAL_CREAM | CUTANEOUS | 3 refills | Status: DC

## 2020-12-26 MED ORDER — ONDANSETRON HCL 8 MG PO TABS: 8.0000 mg | ORAL_TABLET | Freq: Two times a day (BID) | ORAL | 1 refills | Status: DC | PRN

## 2020-12-26 MED ORDER — PROCHLORPERAZINE MALEATE 10 MG PO TABS: 10.0000 mg | ORAL_TABLET | Freq: Four times a day (QID) | ORAL | 1 refills | Status: DC | PRN

## 2020-12-26 NOTE — Progress Notes (Signed)
START ON PATHWAY REGIMEN - Breast     A cycle is every 21 days:     Pertuzumab      Pertuzumab      Trastuzumab-xxxx      Trastuzumab-xxxx      Carboplatin      Docetaxel   **Always confirm dose/schedule in your pharmacy ordering system**  Patient Characteristics: Preoperative or Nonsurgical Candidate (Clinical Staging), Neoadjuvant Therapy followed by Surgery, Invasive Disease, Chemotherapy, HER2 Positive, ER Negative/Unknown Therapeutic Status: Preoperative or Nonsurgical Candidate (Clinical Staging) AJCC M Category: cM0 AJCC Grade: G3 Breast Surgical Plan: Neoadjuvant Therapy followed by Surgery ER Status: Negative (-) AJCC 8 Stage Grouping: IIIA HER2 Status: Positive (+) AJCC T Category: cT3 AJCC N Category: cN1 PR Status: Negative (-) Intent of Therapy: Curative Intent, Discussed with Patient 

## 2020-12-26 NOTE — Progress Notes (Signed)
Radiation Oncology         [phone] ________________________________  Initial Outpatient Consultation  Name: Maria Kennedy MRN: [id number]  Date: 12/26/2020  DOB: 18-Dec-1970  CC:Maria Pepper, MD  Maria Bookbinder, MD   REFERRING PHYSICIAN: Rolm Bookbinder, MD  DIAGNOSIS:    ICD-10-CM   1. Malignant neoplasm of upper-outer quadrant of left breast in female, estrogen receptor negative [facility])  C50.412    Z17.1     Cancer Staging Malignant neoplasm of upper-outer quadrant of left breast in female, estrogen receptor negative ([place]) Staging form: Breast, AJCC 8th Edition - Clinical stage from 12/26/2020: Stage IIIA (cT3, cN1, cM0, G3, ER-, PR-, HER2+) - Signed by Maria Lose, MD on 12/26/2020 Stage prefix: Initial diagnosis Histologic grading system: 3 grade system   CHIEF COMPLAINT: Here to discuss management of left breast cancer  HISTORY OF PRESENT ILLNESS::Maria Kennedy is a 50 y.o. female who presented with breast abnormality on the following imaging: unilateral diagnostic mammogram on the date of 12/12/2020. Symptoms, if any, at that time, were: palpable left breast lump and left axillary tenderness. Ultrasound of the left breast on 12/12/2020 revealed a conglomeration of masses in the left breast at the 2 o'clock position, 3-4 cm from the nipple, spanning a total of 3.9 cm. The larger and more discrete portion of the mass measured 3.1 cm in greatest dimension. There was a separate smaller 7 mm mass at the 2 o'clock position, 6 cm from the nipple, and there were two abnormal lymph nodes, the larger laying more inferiorly and corresponding to the enlarged mammographic lymph node. Biopsy on the date of 12/17/2020 showed invasive ductal carcinoma with high-grade DCIS in the two masses biopsied. No tissue was received in the left axillary lymph node biopsy. ER status: 0% negative; PR status: 0% negative; Her2 status: positive; Grade: 3.  Lymph node biopsy, left axilla, 12/19/2020  revealed metastatic carcinoma consistent with breast primary.  She underwent MRI of her breasts which revealed a 9.5 cm area of non-mass-like enhancement and masses in the left breast as well as at least 3 abnormal lymph nodes in the axilla.  She is here with her significant other today.  She is taking a leave of absence from work.  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  She reports left scapular pain  PREVIOUS RADIATION THERAPY: No  PAST MEDICAL HISTORY:  has a past medical history of ADHD (attention deficit hyperactivity disorder) and Depression.    PAST SURGICAL HISTORY: Past Surgical History:  Procedure Laterality Date  . TUBAL LIGATION      FAMILY HISTORY: family history includes Aneurysm (age of onset: 25) in her brother; Breast cancer in her cousin and maternal aunt; Diabetes in her mother; Hypertension in her maternal grandmother and mother; Leukemia in her maternal grandfather; Lung cancer in her father; Other in her father; Pancreatic cancer in her paternal grandmother; Pneumonia in her mother; Prostate cancer in her paternal uncle.  SOCIAL HISTORY:  reports that she has never smoked. She has never used smokeless tobacco. She reports current alcohol use. She reports that she does not use drugs.  ALLERGIES: Latex  MEDICATIONS:  Current Outpatient Medications  Medication Sig Dispense Refill  . acetaminophen (TYLENOL) 650 MG CR tablet Take 650 mg by mouth every 8 (eight) hours as needed for pain.    Marland Kitchen amphetamine-dextroamphetamine (ADDERALL XR) 20 MG 24 hr capsule Take 20 mg by mouth daily as needed.    Marland Kitchen dexamethasone (DECADRON) 4 MG  tablet Take 1 tablet (4 mg total) by mouth daily. Take 1 tablet day before chemo and 1 tablet day after chemo with food 12 tablet 0  . ferrous sulfate 325 (65 FE) MG EC tablet Take 325 mg by mouth in the morning and at bedtime.    . lidocaine-prilocaine (EMLA) cream Apply to affected area once 30 g 3  . LORazepam  (ATIVAN) 0.5 MG tablet Take 0.5 mg by mouth at bedtime.    . Menaquinone-7 (VITAMIN K2) 40 MCG TABS Take 2 tablets by mouth daily. 80 mcg daily total    . ondansetron (ZOFRAN) 8 MG tablet Take 1 tablet (8 mg total) by mouth 2 (two) times daily as needed (Nausea or vomiting). Start on the third day after chemotherapy. 30 tablet 1  . prochlorperazine (COMPAZINE) 10 MG tablet Take 1 tablet (10 mg total) by mouth every 6 (six) hours as needed (Nausea or vomiting). 30 tablet 1  . VITAMIN D PO Take 5,000 Int'l Units by mouth every other day.     No current facility-administered medications for this encounter.    REVIEW OF SYSTEMS: As above  PHYSICAL EXAM:  vitals were not taken for this visit.   General: Alert and oriented, in no acute distress HEENT: Head is normocephalic. Extraocular movements are intact. Marland Kitchen Heart: Regular in rate and rhythm with no murmurs, rubs, or gallops. Chest: Clear to auscultation bilaterally, with no rhonchi, wheezes, or rales. Abdomen: Soft, nontender, nondistended, with no rigidity or guarding. Extremities: No cyanosis or edema. Skin: No concerning lesions. Musculoskeletal: symmetric strength and muscle tone throughout. Neurologic: Cranial nerves II through XII are grossly intact. No obvious focalities. Speech is fluent. Coordination is intact. Psychiatric: Judgment and insight are intact. Affect is appropriate. Breasts: About one third of the left breast is firm, this firmness is concentrated in the left central and upper and lower outer quadrants. No other palpable masses appreciated in the breasts or axillae bilaterally.   ECOG = 0  0 - Asymptomatic (Fully active, able to carry on all predisease activities without restriction)  1 - Symptomatic but completely ambulatory (Restricted in physically strenuous activity but ambulatory and able to carry out work of a light or sedentary nature. For example, light housework, office work)  2 - Symptomatic, <50% in bed  during the day (Ambulatory and capable of all self care but unable to carry out any work activities. Up and about more than 50% of waking hours)  3 - Symptomatic, >50% in bed, but not bedbound (Capable of only limited self-care, confined to bed or chair 50% or more of waking hours)  4 - Bedbound (Completely disabled. Cannot carry on any self-care. Totally confined to bed or chair)  5 - Death   Eustace Pen MM, Creech RH, Tormey DC, et al. [phone]). "Toxicity and response criteria of the [facility]". [place] Oncol. 5 (6): 649-55   LABORATORY DATA:  Lab Results  Component Value Date   WBC 7.0 12/26/2020   HGB 11.8 (L) 12/26/2020   HCT 37.3 12/26/2020   MCV 87.1 12/26/2020   PLT 410 (H) 12/26/2020   CMP     Component Value Date/Time   NA 139 12/26/2020 1305   K 3.8 12/26/2020 1305   CL 105 12/26/2020 1305   CO2 26 12/26/2020 1305   GLUCOSE 119 (H) 12/26/2020 1305   BUN 9 12/26/2020 1305   CREATININE 0.90 12/26/2020 1305   CALCIUM 9.3 12/26/2020 1305   PROT 7.6 12/26/2020 1305  ALBUMIN 4.4 12/26/2020 1305   AST 19 12/26/2020 1305   ALT 26 12/26/2020 1305   ALKPHOS 50 12/26/2020 1305   BILITOT 0.4 12/26/2020 1305   GFRNONAA >60 12/26/2020 1305         RADIOGRAPHY: MR BREAST BILATERAL W WO CONTRAST INC CAD  Result Date: 12/24/2020 CLINICAL DATA:  50 year old female with 2 newly diagnosed malignancies within the UPPER-OUTER RIGHT breast and metastasis to a LEFT axillary lymph node LABS:  None performed today EXAM: BILATERAL BREAST MRI WITH AND WITHOUT CONTRAST TECHNIQUE: Multiplanar, multisequence MR images of both breasts were obtained prior to and following the intravenous administration of 7 ml of Gadavist Three-dimensional MR images were rendered by post-processing of the original MR data on an independent workstation. The three-dimensional MR images were interpreted, and findings are reported in the following complete MRI report for this study. Three  dimensional images were evaluated at the independent interpreting workstation using the DynaCAD thin client. COMPARISON:  Previous exam(s). FINDINGS: Breast composition: c. Heterogeneous fibroglandular tissue. Background parenchymal enhancement: Moderate. Right breast: No suspicious mass or abnormal enhancement. Similar appearing scattered foci are noted. Left breast: A 9.5 x 5.5 x 4.5 cm area of masslike and non masslike enhancement throughout the central and UPPER OUTER LEFT breast is identified, extending from the nipple into the posterior 3rd of the LEFT breast. Biopsy clip artifact within the central and OUTER portion of the abnormal enhancement is noted. No other abnormal areas of enhancement are noted. Lymph nodes: 3 abnormal LEFT axillary lymph nodes with cortical thickening noted, the largest containing biopsy clip artifact. No other abnormal lymph nodes are noted. Ancillary findings:  None. IMPRESSION: 1. 9.5 x 5.5 x 4.5 cm biopsy-proven LEFT breast malignancy as described. Three abnormal LEFT axillary lymph nodes, 1 which is biopsy proven to be metastatic disease. 2. No MR evidence of RIGHT breast malignancy. RECOMMENDATION: Treatment plan BI-RADS CATEGORY  6: Known biopsy-proven malignancy. Electronically Signed   By: Margarette Canada M.D.   On: 12/24/2020 11:21   US BREAST LTD UNI LEFT INC AXILLA  Result Date: 12/12/2020 CLINICAL DATA:  Patient presents with a palpable left breast lump. She also has been having left axillary tenderness. The symptoms have developed in the last week. EXAM: DIGITAL DIAGNOSTIC UNILATERAL LEFT MAMMOGRAM WITH TOMOSYNTHESIS AND CAD; ULTRASOUND LEFT BREAST LIMITED TECHNIQUE: Left digital diagnostic mammography and breast tomosynthesis was performed. The images were evaluated with computer-aided detection.; Targeted ultrasound examination of the left breast was performed COMPARISON:  Previous exam(s). ACR Breast Density Category c: The breast tissue is heterogeneously dense, which  may obscure small masses. FINDINGS: In the left breast, there is an ill-defined mass or conglomeration of masses with associated architectural distortion and several punctate calcifications that extend from the retroareolar region towards the upper-outer quadrant. Margins are ill-defined and the mass/masses is difficult to measure precisely, but spans approximately 5.5 x 3.3 x 3.6 cm. In the left axilla there is 1 abnormal lymph node in the low axilla. On physical exam, there is a firm palpable retroareolar to upper outer quadrant mass with additional smaller less well-defined mass-like areas between 2 and 3 o'clock. Targeted ultrasound is performed, showing an irregular hypoechoic mass in the left breast at 2 o'clock, 3 cm the nipple, measuring 3.1 x 1.8 x 2.2 cm. There are few echogenic foci within this consistent with calcifications. Adjacent to, likely contiguous with this mass, is a more irregular hypoechoic ill-defined mass with associated architectural distortion that extends lateral and more anterior from the  above measured mass. The combined masses measure approximately 3.9 x 2.8 x 3.1 cm. Lateral to this conglomeration of masses is a smaller mass, hypoechoic and oval in shape, with small echogenic foci consistent with calcifications. This lies at 2 o'clock, 6 cm the nipple, measuring 7 x 6 x 6 mm. Sonographic evaluation of the left axilla shows an abnormal lymph node in the low axilla with a cortical thickness of 9 mm. In the deeper axilla there is another abnormal lymph node with a cortical thickness of 6 mm. IMPRESSION: 1. Highly suspicious findings for breast carcinoma. There is a conglomeration of masses in the left breast at 2 o'clock 3-4 cm the nipple, spanning a total of 3.9 cm, with the larger and more discrete portion of this mass measuring 3.1 cm in greatest dimension. There is a separate smaller, 7 mm mass at 2 o'clock, 6 cm the nipple, and there are 2 abnormal lymph nodes, the larger lying more  inferiorly and corresponding to the enlarged mammographic lymph node. RECOMMENDATION: 1. Ultrasound-guided core needle biopsy of the dominant mass at 2 o'clock, 3 cm the nipple. 2. Ultrasound-guided core needle biopsy of the smaller, 7 mm, mass at 2 o'clock, 6 cm the nipple. 3. Ultrasound-guided core needle biopsy of the larger and more inferior of the 2 abnormal left axillary lymph nodes. This procedure has been scheduled for Friday, March 4th. I have discussed the findings and recommendations with the patient. If applicable, a reminder letter will be sent to the patient regarding the next appointment. BI-RADS CATEGORY  5: Highly suggestive of malignancy. Electronically Signed   By: Lajean Manes M.D.   On: 12/12/2020 15:01   MM DIAG BREAST TOMO UNI LEFT  Result Date: 12/12/2020 CLINICAL DATA:  Patient presents with a palpable left breast lump. She also has been having left axillary tenderness. The symptoms have developed in the last week. EXAM: DIGITAL DIAGNOSTIC UNILATERAL LEFT MAMMOGRAM WITH TOMOSYNTHESIS AND CAD; ULTRASOUND LEFT BREAST LIMITED TECHNIQUE: Left digital diagnostic mammography and breast tomosynthesis was performed. The images were evaluated with computer-aided detection.; Targeted ultrasound examination of the left breast was performed COMPARISON:  Previous exam(s). ACR Breast Density Category c: The breast tissue is heterogeneously dense, which may obscure small masses. FINDINGS: In the left breast, there is an ill-defined mass or conglomeration of masses with associated architectural distortion and several punctate calcifications that extend from the retroareolar region towards the upper-outer quadrant. Margins are ill-defined and the mass/masses is difficult to measure precisely, but spans approximately 5.5 x 3.3 x 3.6 cm. In the left axilla there is 1 abnormal lymph node in the low axilla. On physical exam, there is a firm palpable retroareolar to upper outer quadrant mass with additional  smaller less well-defined mass-like areas between 2 and 3 o'clock. Targeted ultrasound is performed, showing an irregular hypoechoic mass in the left breast at 2 o'clock, 3 cm the nipple, measuring 3.1 x 1.8 x 2.2 cm. There are few echogenic foci within this consistent with calcifications. Adjacent to, likely contiguous with this mass, is a more irregular hypoechoic ill-defined mass with associated architectural distortion that extends lateral and more anterior from the above measured mass. The combined masses measure approximately 3.9 x 2.8 x 3.1 cm. Lateral to this conglomeration of masses is a smaller mass, hypoechoic and oval in shape, with small echogenic foci consistent with calcifications. This lies at 2 o'clock, 6 cm the nipple, measuring 7 x 6 x 6 mm. Sonographic evaluation of the left axilla shows an abnormal  lymph node in the low axilla with a cortical thickness of 9 mm. In the deeper axilla there is another abnormal lymph node with a cortical thickness of 6 mm. IMPRESSION: 1. Highly suspicious findings for breast carcinoma. There is a conglomeration of masses in the left breast at 2 o'clock 3-4 cm the nipple, spanning a total of 3.9 cm, with the larger and more discrete portion of this mass measuring 3.1 cm in greatest dimension. There is a separate smaller, 7 mm mass at 2 o'clock, 6 cm the nipple, and there are 2 abnormal lymph nodes, the larger lying more inferiorly and corresponding to the enlarged mammographic lymph node. RECOMMENDATION: 1. Ultrasound-guided core needle biopsy of the dominant mass at 2 o'clock, 3 cm the nipple. 2. Ultrasound-guided core needle biopsy of the smaller, 7 mm, mass at 2 o'clock, 6 cm the nipple. 3. Ultrasound-guided core needle biopsy of the larger and more inferior of the 2 abnormal left axillary lymph nodes. This procedure has been scheduled for Friday, March 4th. I have discussed the findings and recommendations with the patient. If applicable, a reminder letter will  be sent to the patient regarding the next appointment. BI-RADS CATEGORY  5: Highly suggestive of malignancy. Electronically Signed   By: Lajean Manes M.D.   On: 12/12/2020 15:01   MM DIAG BREAST TOMO UNI RIGHT  Result Date: 12/19/2020 CLINICAL DATA:  Recent diagnosis of multifocal high-grade invasive ductal carcinoma involving the UPPER OUTER QUADRANT of the LEFT breast. Diagnostic RIGHT mammogram is performed in order to evaluate the RIGHT breast prior to definitive treatment. EXAM: DIGITAL DIAGNOSTIC UNILATERAL RIGHT MAMMOGRAM WITH TOMOSYNTHESIS AND CAD TECHNIQUE: Right digital diagnostic mammography and breast tomosynthesis was performed. The images were evaluated with computer-aided detection. COMPARISON:  Previous exam(s). ACR Breast Density Category c: The breast tissue is heterogeneously dense, which may obscure small masses. FINDINGS: No findings suspicious for malignancy. IMPRESSION: No mammographic evidence of malignancy involving the RIGHT breast. RECOMMENDATION: Treatment plan for the recently diagnosed LEFT breast cancer. I have discussed the findings and recommendations with the patient. BI-RADS CATEGORY  1: Negative. Electronically Signed   By: Evangeline Dakin M.D.   On: 12/19/2020 08:35   [place] AXILLARY NODE CORE BIOPSY LEFT  Addendum Date: 12/20/2020   ADDENDUM REPORT: 12/20/2020 13:56 ADDENDUM: Pathology revealed METASTATIC CARCINOMA TO LYMPH NODE, CONSISTENT WITH BREAST PRIMARY of the Left axilla. This was found to be concordant by Dr. Peggye Fothergill. Pathology results were discussed with the patient by telephone. The patient reported doing well after the biopsy with tenderness at the site. Post biopsy instructions and care were reviewed and questions were answered. The patient was encouraged to call [facility] for any additional concerns. My direct phone number was provided. The patient has a recent diagnosis of Left breast cancer and was referred to [facility] on December 26, 2020. The patient is scheduled for a bilateral breast MRI on December 23, 2020. Further recommendations will be guided by the results of the MRI. Pathology results reported by Terie Purser, RN on 12/20/2020. Electronically Signed   By: Evangeline Dakin M.D.   On: 12/20/2020 13:56   Result Date: 12/20/2020 CLINICAL DATA:  50 year old with recent biopsy-proven multifocal grade 3 invasive ductal carcinoma involving the UPPER OUTER quadrant of the LEFT breast. The patient had 2 abnormal LEFT axillary lymph nodes on diagnostic ultrasound 12/12/2020, and biopsy of 1 of the nodes was performed on  12/17/2020; however, no tissue was identified in the bottle sent to pathology, and the lymph node biopsy is repeated. EXAM: ULTRASOUND GUIDED CORE NEEDLE BIOPSY OF A LEFT AXILLARY NODE COMPARISON:  Previous exam(s). FINDINGS: I met with the patient and we discussed the procedure of ultrasound-guided biopsy, including benefits and alternatives. We discussed the high likelihood of a successful procedure. We discussed the risks of the procedure, including infection, bleeding, tissue injury, clip migration, and inadequate sampling. Informed written consent was given. The usual time-out protocol was performed immediately prior to the procedure. Using sterile technique with chlorhexidine as skin antisepsis, 1% lidocaine and 1% lidocaine as local anesthetic, under direct ultrasound visualization, a 14 gauge Bard Marquee core needle device placed through a 13 gauge introducer needle was used to perform biopsy of the previously biopsied abnormal LEFT axillary lymph node using an inferolateral approach. A tribell tissue marking clip had been placed into this lymph node at the time of the prior biopsy, and this clip was visible on ultrasound and was avoided during the tissue sampling. IMPRESSION: Ultrasound guided biopsy of an abnormal LEFT axillary lymph  node. No apparent complications. Electronically Signed: By: Evangeline Dakin M.D. On: 12/19/2020 08:33   [place] AXILLARY NODE CORE BIOPSY LEFT  Addendum Date: 12/20/2020   ADDENDUM REPORT: 12/19/2020 08:31 ADDENDUM: Pathology revealed GRADE III INVASIVE DUCTAL CARCINOMA, HIGH GRADE DUCTAL CARCINOMA IN SITU of the LEFT breast, 2:00 o'clock, 3 cmfn. This was found to be concordant by Dr. Lajean Manes. 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 This was found to be concordant by Dr. Lajean Manes. Pathology revealed NO TISSUE RECEIVED of the Left axillary lymph node. This was found to be concordant by Dr. Lajean Manes, with re-biopsy recommended. Pathology results were discussed with the patient by telephone. The patient reported doing well after the biopsies with tenderness at the sites. Post biopsy instructions and care were reviewed and questions were answered. The patient was encouraged to call [facility] for any additional concerns. My direct phone number was provided. The patient was referred to [facility] at [facility] on December 26, 2020. The patient is scheduled for a LEFT axillary node biopsy and RIGHT diagnostic mammogram on December 19, 2020. Recommendation for a bilateral breast MRI to exclude any additional sites of disease given breast density, age and family history. This has been scheduled for December 23, 2020. Pathology results were discussed via telephone with Burman Riis, NP at [facility] on December 19, 2020. Pathology results reported by Terie Purser, RN on 12/19/2020. Electronically Signed   By: Lajean Manes M.D.   On: 12/19/2020 08:31   Result Date: 12/20/2020 CLINICAL DATA:  Patient presents for ultrasound-guided core needle biopsy 2 left breast masses and an abnormal left axillary lymph node. EXAM:  ULTRASOUND GUIDED LEFT BREAST CORE NEEDLE BIOPSY: PRIMARY MASS IN THE 2 O'CLOCK POSITION, 3 CM THE NIPPLE. ULTRASOUND GUIDED LEFT BREAST CORE NEEDLE BIOPSY: SECONDARY MASS IN THE 2 O'CLOCK POSITION, 6 CM THE NIPPLE. ULTRASOUND GUIDED LEFT AXILLARY LYMPH NODE CORE NEEDLE BIOPSY COMPARISON:  Previous exam(s). PROCEDURE: I met with the patient and we discussed the procedure of ultrasound-guided biopsy, including benefits and alternatives. We discussed the high likelihood of a successful procedure. We discussed the risks of the procedure, including infection, bleeding, tissue injury, clip migration, and inadequate sampling. Informed written consent was given.  The usual time-out protocol was performed immediately prior to the procedure. Lesion #1: Primary 3.1 cm mass at 2 o'clock, 3 cm the nipple. Lesion quadrant: Upper outer quadrant Using sterile technique and 1% Lidocaine as local anesthetic, under direct ultrasound visualization, a 12 gauge spring-loaded device was used to perform biopsy of the 3.1 cm irregular mass using a inferolateral approach. At the conclusion of the procedure coil shaped tissue marker clip was deployed into the biopsy cavity. Lesion #1: Secondary, 0.7 cm mass at 2 o'clock, 6 cm the nipple. Lesion quadrant: Upper outer quadrant Using sterile technique and 1% Lidocaine as local anesthetic, under direct ultrasound visualization, a 12 gauge spring-loaded device was used to perform biopsy of the 7 mm mass using a inferomedial approach. At the conclusion of the procedure heart shaped tissue marker clip was deployed into the biopsy cavity. Lesion #1: Left axillary lymph node. Lesion location: Left axilla. Using sterile technique and 1% Lidocaine as local anesthetic, under direct ultrasound visualization, a 14 gauge spring-loaded device was used to perform biopsy of the abnormal left axillary lymph node using an inferior approach. At the conclusion of the procedure [facility] tissue marker clip was  deployed into the biopsy cavity. Follow up 2 view mammogram was performed and dictated separately. IMPRESSION: Ultrasound guided biopsy of 2 left breast masses and an abnormal left axillary lymph node. No apparent complications. Electronically Signed: By: Lajean Manes M.D. On: 12/17/2020 10:24   MM CLIP PLACEMENT LEFT  Result Date: 12/17/2020 CLINICAL DATA:  Evaluate post biopsy marker clip placements following ultrasound-guided core needle biopsy of 2 left breast masses and a left axillary lymph node. EXAM: DIAGNOSTIC LEFT MAMMOGRAM POST ULTRASOUND BIOPSY COMPARISON:  Previous exam(s). FINDINGS: Mammographic images were obtained following ultrasound guided biopsy of 2 left breast masses and an abnormal left axillary lymph node. The coil biopsy clip lies within the primary mass in the retroareolar breast projecting near 2 o'clock. The wing shaped biopsy clip lies within the expected location of the smaller mass seen at 2 o'clock, 6 cm the [facility], The biopsy clip lies within the abnormal left axillary lymph node. IMPRESSION: Appropriate positioning of the coil shaped, wing shaped and [employer] biopsy marking clips at the site of biopsy in the upper outer quadrant and left axilla. Final Assessment: Post Procedure Mammograms for Marker Placement Electronically Signed   By: Lajean Manes M.D.   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 This was found to be concordant by Dr. Lajean Manes. 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 This was found to be concordant by Dr. Lajean Manes. Pathology revealed NO TISSUE RECEIVED of the Left axillary lymph node. This was found to  be concordant by Dr. Lajean Manes, with re-biopsy recommended. Pathology results were discussed with the patient by telephone. The patient reported doing well after the biopsies with tenderness at the sites. Post biopsy instructions and care were reviewed and questions were answered. The patient was encouraged to call [facility] for any additional concerns. My direct phone number was provided. The patient was referred to [facility]  on December 26, 2020. The patient is scheduled for a LEFT axillary node biopsy and RIGHT diagnostic mammogram on December 19, 2020. Recommendation for a bilateral breast MRI to exclude any additional sites of disease given breast density, age and family history. This has been scheduled for December 23, 2020. Pathology results were discussed via telephone with Burman Riis, NP at [facility] on December 19, 2020. Pathology results reported by Terie Purser, RN on 12/19/2020. Electronically Signed   By: Lajean Manes M.D.   On: 12/19/2020 08:31   Result Date: 12/20/2020 CLINICAL DATA:  Patient presents for ultrasound-guided core needle biopsy 2 left breast masses and an abnormal left axillary lymph node. EXAM: ULTRASOUND GUIDED LEFT BREAST CORE NEEDLE BIOPSY: PRIMARY MASS IN THE 2 O'CLOCK POSITION, 3 CM THE NIPPLE. ULTRASOUND GUIDED LEFT BREAST CORE NEEDLE BIOPSY: SECONDARY MASS IN THE 2 O'CLOCK POSITION, 6 CM THE NIPPLE. ULTRASOUND GUIDED LEFT AXILLARY LYMPH NODE CORE NEEDLE BIOPSY COMPARISON:  Previous exam(s). PROCEDURE: I met with the patient and we discussed the procedure of ultrasound-guided biopsy, including benefits and alternatives. We discussed the high likelihood of a successful procedure. We discussed the risks of the procedure, including infection, bleeding, tissue injury, clip migration, and inadequate sampling. Informed written consent was given. The usual  time-out protocol was performed immediately prior to the procedure. Lesion #1: Primary 3.1 cm mass at 2 o'clock, 3 cm the nipple. Lesion quadrant: Upper outer quadrant Using sterile technique and 1% Lidocaine as local anesthetic, under direct ultrasound visualization, a 12 gauge spring-loaded device was used to perform biopsy of the 3.1 cm irregular mass using a inferolateral approach. At the conclusion of the procedure coil shaped tissue marker clip was deployed into the biopsy cavity. Lesion #1: Secondary, 0.7 cm mass at 2 o'clock, 6 cm the nipple. Lesion quadrant: Upper outer quadrant Using sterile technique and 1% Lidocaine as local anesthetic, under direct ultrasound visualization, a 12 gauge spring-loaded device was used to perform biopsy of the 7 mm mass using a inferomedial approach. At the conclusion of the procedure heart shaped tissue marker clip was deployed into the biopsy cavity. Lesion #1: Left axillary lymph node. Lesion location: Left axilla. Using sterile technique and 1% Lidocaine as local anesthetic, under direct ultrasound visualization, a 14 gauge spring-loaded device was used to perform biopsy of the abnormal left axillary lymph node using an inferior approach. At the conclusion of the procedure [facility] tissue marker clip was deployed into the biopsy cavity. Follow up 2 view mammogram was performed and dictated separately. IMPRESSION: Ultrasound guided biopsy of 2 left breast masses and an abnormal left axillary lymph node. No apparent complications. Electronically Signed: By: Lajean Manes M.D. 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 This was found to be concordant by Dr. Lajean Manes. 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 This was found to be concordant by Dr. Lajean Manes. Pathology revealed NO TISSUE RECEIVED of the Left axillary lymph node. This was found to be concordant by Dr. Lajean Manes, with re-biopsy recommended. Pathology results were discussed with the patient by telephone. The patient reported doing well after the biopsies with tenderness at the sites.  Post biopsy instructions and care were reviewed and questions were answered. The patient was encouraged to call [facility] for any additional concerns. My direct phone number was provided. The patient was referred to [facility] on December 26, 2020. The patient is scheduled for a LEFT axillary node biopsy and RIGHT diagnostic mammogram on December 19, 2020. Recommendation for a bilateral breast MRI to exclude any additional sites of disease given breast density, age and family history. This has been scheduled for December 23, 2020. Pathology results were discussed via telephone with Burman Riis, NP at [facility] on December 19, 2020. Pathology results reported by Terie Purser, RN on 12/19/2020. Electronically Signed   By: Lajean Manes M.D.   On: 12/19/2020 08:31   Result Date: 12/20/2020 CLINICAL DATA:  Patient presents for ultrasound-guided core needle biopsy 2 left breast masses and an abnormal left axillary lymph node. EXAM: ULTRASOUND GUIDED LEFT BREAST CORE NEEDLE BIOPSY: PRIMARY MASS IN THE 2 O'CLOCK POSITION, 3 CM THE NIPPLE. ULTRASOUND GUIDED LEFT BREAST CORE NEEDLE BIOPSY: SECONDARY MASS IN THE 2 O'CLOCK POSITION, 6 CM THE NIPPLE. ULTRASOUND GUIDED LEFT AXILLARY LYMPH NODE CORE NEEDLE BIOPSY COMPARISON:  Previous exam(s). PROCEDURE: I met with the patient and we discussed the procedure of ultrasound-guided biopsy, including benefits and alternatives. We discussed the  high likelihood of a successful procedure. We discussed the risks of the procedure, including infection, bleeding, tissue injury, clip migration, and inadequate sampling. Informed written consent was given. The usual time-out protocol was performed immediately prior to the procedure. Lesion #1: Primary 3.1 cm mass at 2 o'clock, 3 cm the nipple. Lesion quadrant: Upper outer quadrant Using sterile technique and 1% Lidocaine as local anesthetic, under direct ultrasound visualization, a 12 gauge spring-loaded device was used to perform biopsy of the 3.1 cm irregular mass using a inferolateral approach. At the conclusion of the procedure coil shaped tissue marker clip was deployed into the biopsy cavity. Lesion #1: Secondary, 0.7 cm mass at 2 o'clock, 6 cm the nipple. Lesion quadrant: Upper outer quadrant Using sterile technique and 1% Lidocaine as local anesthetic, under direct ultrasound visualization, a 12 gauge spring-loaded device was used to perform biopsy of the 7 mm mass using a inferomedial approach. At the conclusion of the procedure heart shaped tissue marker clip was deployed into the biopsy cavity. Lesion #1: Left axillary lymph node. Lesion location: Left axilla. Using sterile technique and 1% Lidocaine as local anesthetic, under direct ultrasound visualization, a 14 gauge spring-loaded device was used to perform biopsy of the abnormal left axillary lymph node using an inferior approach. At the conclusion of the procedure [facility] tissue marker clip was deployed into the biopsy cavity. Follow up 2 view mammogram was performed and dictated separately. IMPRESSION: Ultrasound guided biopsy of 2 left breast masses and an abnormal left axillary lymph node. No apparent complications. Electronically Signed: By: Lajean Manes M.D. On: 12/17/2020 10:24      IMPRESSION/PLAN: Left breast cancer, locally advanced  She was discussed at tumor board today.  The consensus of the team is to refer her to genetic  testing and also conduct staging scans.  We talked about curative treatment, assuming that she does not demonstrate metastatic disease on her staging scans.  She anticipates neoadjuvant chemotherapy followed by surgery.  Breast conservation may be feasible but this will depend upon her response to chemotherapy  It was a pleasure meeting the patient  today. We discussed the risks, benefits, and side effects of radiotherapy. I recommend radiotherapy to the left breast and regional nodes to reduce her risk of locoregional recurrence by 2/3.  We discussed that radiation would take approximately 6-7 weeks to complete and that I would give the patient a few weeks to heal following surgery before starting treatment planning.  We spoke about acute effects including skin irritation and fatigue as well as much less common late effects including internal organ injury or irritation. We spoke about the latest technology that is used to minimize the risk of late effects for patients undergoing radiotherapy to the breast or chest wall. No guarantees of treatment were given. The patient is enthusiastic about proceeding with treatment. I look forward to participating in the patient's care.  I will await her referral back to me for postoperative follow-up and eventual CT simulation/treatment planning.  We discussed measures to reduce the risk of infection during the COVID-19 pandemic.  She has been fully vaccinated and boosted.  On date of service, in total, I spent 50 minutes on this encounter. Patient was seen in person.   __________________________________________   Eppie Gibson, MD  This document serves as a record of services personally performed by Eppie Gibson, MD. It was created on his behalf by Clerance Lav, a trained medical scribe. The creation of this record is based on the scribe's personal observations and the provider's statements to them. This document has been checked and approved by the attending  provider.

## 2020-12-26 NOTE — Research (Signed)
[facility] 16070 TREATMENT OF REFRACTORY NAUSEA  12/26/20   4:10PM  REFERRAL: Patient Maria Kennedy was identified by Dr. Lindi Adie as a potential candidate for the above listed study.  This Clinical Research Coordinator met with Maria Kennedy, [id number], on 12/26/20 in a manner and location that ensures patient privacy to discuss participation in the above listed research study.  Patient is Accompanied by her husband, Edd Arbour.  A copy of the informed consent document and separate HIPAA Authorization was provided to the patient.  Patient reads, speaks, and understands [place].   Patient was provided with the business card of this Coordinator and encouraged to contact the research team with any questions.  Approximately 10 minutes were spent with the patient reviewing the informed consent documents.  Patient was provided the option of taking informed consent documents home to review and was encouraged to review at their convenience with their support network, including other care providers. Patient took the consent documents home to review.Patient and her husband were thanked for their time and consideration of the above mentioned study. The plan is to contact patient on Monday to follow-up on interest and to answer any questions or concerns.   Carol Ada, RT(R)(T) Clinical Research Coordinator

## 2020-12-26 NOTE — Patient Instructions (Signed)

## 2020-12-26 NOTE — Assessment & Plan Note (Signed)
12/19/2020: Palpable left breast mass and left axillary tenderness.  Mammogram revealed conglomeration of masses 3.9 cm and an adjacent 0.7 cm mass and 2 axillary lymph nodes that were abnormal. 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 After reviewing the pathology in detail, we proceeded to discuss the different treatment options between surgery, radiation, chemotherapy, antiestrogen therapies.  Recommendation based on multidisciplinary tumor board: 1. Neoadjuvant chemotherapy with TCH Perjeta 6 cycles followed by Herceptin Perjeta maintenance versus Kadcyla maintenance (based on response to neoadjuvant chemo) for 1 year 2. Followed by breast conserving surgery and targeted lymph node surgery 3. Followed by adjuvant radiation therapy   We will also perform staging scans.  Chemotherapy Counseling: I discussed the risks and benefits of chemotherapy including the risks of nausea/ vomiting, risk of infection from low WBC count, fatigue due to chemo or anemia, bruising or bleeding due to low platelets, mouth sores, loss/ change in taste and decreased appetite. Liver and kidney function will be monitored through out chemotherapy as abnormalities in liver and kidney function may be a side effect of treatment. Cardiac dysfunction due to Herceptin and Perjeta and neuropathy risk from Taxotere were discussed in detail. Risk of permanent bone marrow dysfunction due to chemo were also discussed.  Plan: 1. Port placement 2. Echocardiogram 3. Chemotherapy class 4. Breast MRI  [facility] nausea study Return to clinic in 2 weeks to start  chemotherapy.

## 2020-12-26 NOTE — Therapy (Signed)
[name], [name], [address] Phone: [phone]   Fax:  [phone]  Physical Therapy Evaluation  Patient Details  Name: Maria Kennedy MRN: [id number] Date of Birth: 1970-12-09 Referring Provider (PT): Dr. Rolm Bookbinder   Encounter Date: 12/26/2020   PT End of Session - 12/26/20 1611    Visit Number 1    Number of Visits 2    Date for PT Re-Evaluation 06/28/21    PT Start Time 1508    PT Stop Time 1550    PT Time Calculation (min) 42 min    Activity Tolerance Patient tolerated treatment well    Behavior During Therapy [facility] for tasks assessed/performed           Past Medical History:  Diagnosis Date  . ADHD (attention deficit hyperactivity disorder)   . Depression     Past Surgical History:  Procedure Laterality Date  . TUBAL LIGATION      There were no vitals filed for this visit.    Subjective Assessment - 12/26/20 1553    Subjective Patient reports she is here today to be seen by her medical team for her newly diagnosed left breast cancer.    Patient is accompained by: Family member    Pertinent History Patient was diagnosed on 12/12/2020 with left grade III invasive ductal carcinoma breast cancer. It measures 5.5 cm total with 3.1 cm mass, 7 mm mass, and calcifications. It is ER/PR negative and HER2 positive with a Ki67 of 70%. She has a positive axillary lymph node and reports left scapular pain which has worsened recently.    Patient Stated Goals Reduce lymphedema risk and learn post op shoulder ROM HEP    Currently in Pain? Yes    Pain Score 7     Pain Location Scapula    Pain Orientation Left    Pain Descriptors / Indicators Aching    Pain Type Chronic pain    Pain Radiating Towards Left shoulder and upper arm    Pain Onset More than a month ago    Pain Frequency Constant    Aggravating Factors  Worse at night; uneffected with special tests and no significant change with  AROM    Pain Relieving Factors Nothing              [facility] PT Assessment - 12/26/20 0001      Assessment   Medical Diagnosis Left breast cancer    Referring Provider (PT) Dr. Rolm Bookbinder    Onset Date/Surgical Date 12/12/20    Hand Dominance Left    Prior Therapy none      Precautions   Precautions Other (comment)    Precaution Comments active cancer      Restrictions   Weight Bearing Restrictions No      Balance Screen   Has the patient fallen in the past 6 months No    Has the patient had a decrease in activity level because of a fear of falling?  No    Is the patient reluctant to leave their home because of a fear of falling?  No      Home Environment   Living Environment Private residence    Living Arrangements Spouse/significant other;Children   Husband, 71 and 11 y.o. 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; works  from home    Leisure She has not exercised in a year      Cognition   Overall Cognitive Status Within Functional Limits for tasks assessed      Posture/Postural Control   Posture/Postural Control Postural limitations    Postural Limitations Rounded Shoulders;Forward head      ROM / Strength   AROM / PROM / Strength AROM;Strength      AROM   Overall AROM Comments Cervical AROM is WNL    AROM Assessment Site Shoulder    Right/Left Shoulder Right;Left    Right Shoulder Extension 45 Degrees    Right Shoulder Flexion 165 Degrees    Right Shoulder ABduction 174 Degrees    Right Shoulder Internal Rotation 63 Degrees    Right Shoulder External Rotation 90 Degrees    Left Shoulder Extension 46 Degrees    Left Shoulder Flexion 165 Degrees    Left Shoulder ABduction 176 Degrees    Left Shoulder Internal Rotation 60 Degrees    Left Shoulder External Rotation 83 Degrees      Strength   Overall Strength  Within functional limits for tasks performed    Overall Strength Comments No c/o increased scapular pain with MMT      Palpation   Palpation comment Pt reported possible discomfort with left intrascap palpation but felt her pain was deeper than what PT could palpate      Special Tests    Special Tests Rotator Cuff Impingement    Rotator Cuff Impingment tests Empty Can test      Empty Can test   Findings Negative    Side Left             LYMPHEDEMA/ONCOLOGY QUESTIONNAIRE - 12/26/20 0001      Type   Cancer Type Left breast cancer      Lymphedema Assessments   Lymphedema Assessments Upper extremities      Right Upper Extremity Lymphedema   10 cm Proximal to Olecranon Process 30.2 cm    Olecranon Process 23.4 cm    10 cm Proximal to Ulnar Styloid Process 21.5 cm    Just Proximal to Ulnar Styloid Process 15 cm    Across Hand at [employer] 20 cm    At [place] of 2nd Digit 6.3 cm      Left Upper Extremity Lymphedema   10 cm Proximal to Olecranon Process 30.3 cm    Olecranon Process 23.8 cm    10 cm Proximal to Ulnar Styloid Process 20.9 cm    Just Proximal to Ulnar Styloid Process 14.5 cm    Across Hand at [employer] 19.8 cm    At [place] of 2nd Digit 6.1 cm           L-DEX FLOWSHEETS - 12/26/20 1600      L-DEX LYMPHEDEMA SCREENING   Measurement Type Unilateral    L-DEX MEASUREMENT EXTREMITY Upper Extremity    POSITION  Standing    DOMINANT SIDE Left    At Risk Side Left    BASELINE SCORE (UNILATERAL) -5.2          The patient was assessed using the L-Dex machine today to produce a lymphedema index baseline score. The patient will be reassessed on a regular basis (typically every 3 months) to obtain new L-Dex scores. If the score is > 6.5 points away from his/her baseline score indicating onset of subclinical lymphedema, it will be recommended to wear a compression garment for 4 weeks, 12 hours per  day and then be reassessed. If the score continues to be > 6.5  points from baseline at reassessment, we will initiate lymphedema treatment. Assessing in this manner has a 95% rate of preventing clinically significant lymphedema.       Katina Dung - 12/26/20 0001    Open a tight or new jar No difficulty    Do heavy household chores (wash walls, wash floors) Mild difficulty    Carry a shopping bag or briefcase Mild difficulty    Wash your back Mild difficulty    Use a knife to cut food No difficulty    Recreational activities in which you take some force or impact through your arm, shoulder, or hand (golf, hammering, tennis) Mild difficulty    During the past week, to what extent has your arm, shoulder or hand problem interfered with your normal social activities with family, friends, neighbors, or groups? Slightly    During the past week, to what extent has your arm, shoulder or hand problem limited your work or other regular daily activities Slightly    Arm, shoulder, or hand pain. Moderate    Tingling (pins and needles) in your arm, shoulder, or hand Mild    Difficulty Sleeping Moderate difficulty    DASH Score 25 %            Objective measurements completed on examination: See above findings.       Patient was instructed today in a home exercise program today for post op shoulder range of motion. These included active assist shoulder flexion in sitting, scapular retraction, wall walking with shoulder abduction, and hands behind head external rotation.  She was encouraged to do these twice a day, holding 3 seconds and repeating 5 times when permitted by her physician.            PT Education - 12/26/20 1609    Education Details Lymphedema risk reduction and post op shoulder ROM HEP    Person(s) Educated Patient;Spouse    Methods Explanation;Demonstration;Handout    Comprehension Returned demonstration;Verbalized understanding               PT Long Term Goals - 12/26/20 1614      PT LONG TERM GOAL #1   Title Patient will  demonstrate she has regained full shoulder ROM and function post operatively compared to baselines.    Time 6    Period Months    Status New    Target Date 06/28/21           Breast Clinic Goals - 12/26/20 1614      Patient will be able to verbalize understanding of pertinent lymphedema risk reduction practices relevant to her diagnosis specifically related to skin care.   Time 1    Period Days    Status Achieved      Patient will be able to return demonstrate and/or verbalize understanding of the post-op home exercise program related to regaining shoulder range of motion.   Time 1    Period Days    Status Achieved      Patient will be able to verbalize understanding of the importance of attending the postoperative After Breast Cancer Class for further lymphedema risk reduction education and therapeutic exercise.   Time 1    Period Days    Status Achieved                 Plan - 12/26/20 1611    Clinical Impression Statement Patient was diagnosed on 12/12/2020  with left grade III invasive ductal carcinoma breast cancer. It measures 5.5 cm total with 3.1 cm mass, 7 mm mass, and calcifications. It is ER/PR negative and HER2 positive with a Ki67 of 70%. She has a positive axillary lymph node and reports left scapular pain which has worsened recently. Her multidisciplinary medical team met prior to her assessments to determine a recommended treatment plan. She is planning to have a CT/bone scan done to determine extent of disease followed by neoadjuvant chemotherapy followed by either a left lumpectomy or mastectomy with an axillary lymph node dissection and radiation. She will benefit from a post op PT reassessment to determine needs and from L-Dex screens every 3 months for 2 years to detect subclinical lymphedema.    Stability/Clinical Decision Making Stable/Uncomplicated    Clinical Decision Making Low    Rehab Potential Excellent    PT Frequency --   Eval and 1 f/u visit   PT  Treatment/Interventions ADLs/Self Care Home Management;Therapeutic exercise;Patient/family education    PT Next Visit Plan Will reassess 3-4 weeks post op    PT Home Exercise Plan Post op shoulder ROM HEP    Consulted and Agree with Plan of Care Patient;Family member/caregiver    Family Member Consulted Husband           Patient will benefit from skilled therapeutic intervention in order to improve the following deficits and impairments:  Postural dysfunction,Decreased range of motion,Decreased knowledge of precautions,Impaired UE functional use,Pain  Visit Diagnosis: Malignant neoplasm of upper-outer quadrant of left breast in female, estrogen receptor negative ([place]) - Plan: PT plan of care cert/re-cert  Abnormal posture - Plan: PT plan of care cert/re-cert  Acute pain of left shoulder - Plan: PT plan of care cert/re-cert   Patient will follow up at outpatient cancer rehab 3-4 weeks following surgery.  If the patient requires physical therapy at that time, a specific plan will be dictated and sent to the referring physician for approval. The patient was educated today on appropriate basic range of motion exercises to begin post operatively and the importance of attending the After Breast Cancer class following surgery.  Patient was educated today on lymphedema risk reduction practices as it pertains to recommendations that will benefit the patient immediately following surgery.  She verbalized good understanding.      Problem List Patient Active Problem List   Diagnosis Date Noted  . Malignant neoplasm of upper-outer quadrant of left breast in female, estrogen receptor negative ([place]) 12/24/2020  . Benign paroxysmal positional vertigo of left ear 11/15/2018  . ADD (attention deficit disorder) 08/11/2011   Annia Friendly, PT 12/26/20 4:17 PM  [place], [name], [address] Phone: [phone]   Fax:   [phone]  Name: Maria Kennedy MRN: [id number] Date of Birth: 07-29-1971

## 2020-12-27 ENCOUNTER — Encounter: Payer: Self-pay | Admitting: Genetic Counselor

## 2020-12-27 DIAGNOSIS — Z806 Family history of leukemia: Secondary | ICD-10-CM | POA: Insufficient documentation

## 2020-12-27 DIAGNOSIS — Z801 Family history of malignant neoplasm of trachea, bronchus and lung: Secondary | ICD-10-CM | POA: Insufficient documentation

## 2020-12-27 DIAGNOSIS — Z803 Family history of malignant neoplasm of breast: Secondary | ICD-10-CM | POA: Insufficient documentation

## 2020-12-27 DIAGNOSIS — Z800 Family history of malignant neoplasm of digestive organs: Secondary | ICD-10-CM | POA: Insufficient documentation

## 2020-12-27 NOTE — Progress Notes (Signed)
REFERRING PROVIDER: Nicholas Lose, MD [address] [place],  [place] [address]  PRIMARY PROVIDER:  London Pepper, MD  PRIMARY REASON FOR VISIT:  1. Family history of breast cancer   2. Family history of lung cancer   3. Family history of pancreatic cancer   4. Family history of leukemia      HISTORY OF PRESENT ILLNESS:   Maria Kennedy, a 50 y.o. female, was seen for a [place] cancer genetics consultation at the request of Dr. Lindi Adie due to a personal and family history of cancer.  Maria Kennedy presents to clinic today to discuss the possibility of a hereditary predisposition to cancer, genetic testing, and to further clarify her future cancer risks, as well as potential cancer risks for family members.   In March of 2022, at the age of 64, Maria Kennedy was diagnosed with invasive ductal carcinoma with ductal carcinoma in situ of the left breast. The tumor is ER-/PR-/Her2+. The treatment plan includes neoadjuvant chemotherapy, surgery, and radiation therapy.    CANCER HISTORY:  Oncology History  Malignant neoplasm of upper-outer quadrant of left breast in female, estrogen receptor negative ([place])  12/19/2020 Initial Diagnosis   Patient palpated a left breast mass and noted left axilla tenderness for one week. Diagnostic mammogram and US showed a conglomeration of masses in the left breast at the 2 o'clock position 3-4cm from the nipple spanning 3.9cm total, with a 0.7cm mass at the 2 o'clock position 6cm from the nipple, and two abnormal axillary lymph nodes. 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   12/26/2020 Cancer Staging   Staging form: Breast, AJCC 8th Edition - Clinical stage from 12/26/2020: Stage IIIA (cT3, cN1, cM0, G3, ER-, PR-, HER2+) - Signed by Maria Lose, MD on 12/26/2020 Stage prefix: Initial diagnosis Histologic grading system: 3 grade system   01/08/2021 -  Chemotherapy    Patient is on  Treatment Plan: BREAST  DOCETAXEL + CARBOPLATIN + TRASTUZUMAB + PERTUZUMAB  (TCHP) Q21D          RISK FACTORS:  Menarche was at age 62.  First live birth at age 44.  OCP use for approximately 0 years.  Ovaries intact: yes.  Hysterectomy: no.  Menopausal status: premenopausal.  HRT use: 0 years. Colonoscopy: yes; 01/06/2020. Mammogram within the last year: yes. Up to date with pelvic exams: yes.    Past Medical History:  Diagnosis Date  . ADHD (attention deficit hyperactivity disorder)   . Depression   . Family history of breast cancer   . Family history of leukemia   . Family history of lung cancer   . Family history of pancreatic cancer     Past Surgical History:  Procedure Laterality Date  . TUBAL LIGATION      Social History   Socioeconomic History  . Marital status: Married    Spouse name: Not on file  . Number of children: 2  . Years of education: Not on file  . Highest education level: Not on file  Occupational History  . Occupation: Human resources officer: [employer]  Tobacco Use  . Smoking status: Never Smoker  . Smokeless tobacco: Never Used  Vaping Use  . Vaping Use: Never used  Substance and Sexual Activity  . Alcohol use: Yes    Comment: 1 beer 2-3 times per month  . Drug use: Never  . Sexual activity: Not Currently    Partners: Male  Birth control/protection: Surgical  Other Topics Concern  . Not on file  Social History Narrative   Lives at home with husband and child   Left handed   Caffeine: 1 cup of coffee in the mornings    Social Determinants of Health   Financial Resource Strain: Not on file  Food Insecurity: Not on file  Transportation Needs: Not on file  Physical Activity: Not on file  Stress: Not on file  Social Connections: Not on file     FAMILY HISTORY:  We obtained a detailed, 4-generation family history.  Significant diagnoses are listed below: Family History  Problem Relation Age of Onset  . Hypertension  Mother   . Diabetes Mother   . Pneumonia Mother   . Lung cancer Father 36       smoker  . Other Father        vertigo  . Pancreatic cancer Paternal Grandmother        dx 19s  . Hypertension Maternal Grandmother   . Leukemia Maternal Grandfather 40  . Breast cancer Maternal Aunt        dx >50  . Prostate cancer Paternal Uncle   . Aneurysm Half-Brother 21       DECEASED  . Breast cancer Cousin        dx <50, maternal first cousin  . Breast cancer Cousin        dx <50, maternal first cousin  . Breast cancer Cousin 30       maternal first cousin once removed (cousin's daughter)   Maria Kennedy has one daughter (age 66) and one son (age 28). She has two paternal half-brothers (ages 57 and 57), one paternal half-sister (age 22), one maternal half-sister (age 85) and one maternal half-brother (deceased at age 68 from an aneurysm). None of these relatives have had cancer.  Maria Kennedy mother died at age 35 without cancer. There were five maternal aunts and six maternal uncles. One aunt was diagnosed with breast cancer older than 72. Two maternal first cousins were diagnosed with breast cancer younger than 67. One maternal first cousin once removed (a first cousin's daughter) was diagnosed with breast cancer at age 38. Maria Kennedy maternal grandmother died older than 23 without cancer. Her maternal grandfather died at age 12 from leukemia (diagnosed age 93).   Maria Kennedy father is alive at age 34 and has a history of lung cancer (diagnosed age 7) and was smoker. Her father had at least six paternal half-siblings, although Maria Kennedy does not have information about these relatives. Maria Kennedy paternal grandmother died in her 4s from pancreatic cancer (diagnosed in her 91s). Her paternal grandfather died at age 2 without cancer.  Maria Kennedy is unaware of previous family history of genetic testing for hereditary cancer risks. Patient's maternal ancestors are of Black/African  American descent, and paternal ancestors are of Black/African American and White/Caucasian descent. There is no reported Ashkenazi Jewish ancestry. There is no known consanguinity.  GENETIC COUNSELING ASSESSMENT: Maria Kennedy is a 50 y.o. female with a personal and family history of young-onset breast cancer, as well as a family history of pancreatic cancer, leukemia, and lung cancer, which is somewhat suggestive of a hereditary cancer syndrome and predisposition to cancer. We, therefore, discussed and recommended the following at today's visit.   DISCUSSION: We discussed that approximately 5-10% of breast cancer is hereditary, with most cases associated with the BRCA1 and BRCA2 genes. There are other genes that can be associated with hereditary  breast cancer syndromes. These include ATM, CHEK2, PALB2, etc. We discussed that testing is beneficial for several reasons, including knowing about other cancer risks, identifying potential screening and risk-reduction options that may be appropriate, and to understand if other family members could be at risk for cancer and allow them to undergo genetic testing.   We reviewed the characteristics, features and inheritance patterns of hereditary cancer syndromes. We also discussed genetic testing, including the appropriate family members to test, the process of testing, insurance coverage and turn-around-time for results. We discussed the implications of a negative, positive and/or variant of uncertain significant result. We recommended Maria Kennedy pursue genetic testing for the [facility] CancerNext-Expanded + RNAinsight panel.   The CancerNext-Expanded + RNAinsight gene panel offered by [employer] and includes sequencing and rearrangement analysis for the following 77 genes: AIP, ALK, APC, ATM, AXIN2, BAP1, BARD1, BLM, BMPR1A, BRCA1, BRCA2, BRIP1, CDC73, CDH1, CDK4, CDKN1B, CDKN2A, CHEK2, CTNNA1, DICER1, FANCC, FH, FLCN, GALNT12, KIF1B, LZTR1, MAX, MEN1, MET, MLH1,  MSH2, MSH3, MSH6, MUTYH, NBN, NF1, NF2, NTHL1, PALB2, PHOX2B, PMS2, POT1, PRKAR1A, PTCH1, PTEN, RAD51C, RAD51D, RB1, RECQL, RET, SDHA, SDHAF2, SDHB, SDHC, SDHD, SMAD4, SMARCA4, SMARCB1, SMARCE1, STK11, SUFU, TMEM127, TP53, TSC1, TSC2, VHL and XRCC2 (sequencing and deletion/duplication); EGFR, EGLN1, HOXB13, KIT, MITF, PDGFRA, POLD1 and POLE (sequencing only); EPCAM and GREM1 (deletion/duplication only). RNA data is routinely analyzed for use in variant interpretation for all genes.  Based on Maria Kennedy's personal and family history of cancer, she meets medical criteria for genetic testing. Despite that she meets criteria, there may still be an out of pocket cost.   PLAN: After considering the risks, benefits, and limitations, Maria Kennedy provided informed consent to pursue genetic testing and the blood sample was sent to [facility] for analysis of the CancerNext-Expanded + RNAinsight panel. Results should be available within approximately two-three weeks' time, at which point they will be disclosed by telephone to Maria Kennedy, as will any additional recommendations warranted by these results. Maria Kennedy will receive a summary of her genetic counseling visit and a copy of her results once available. This information will also be available in Epic.   Maria Kennedy questions were answered to her satisfaction today. Our contact information was provided should additional questions or concerns arise. Thank you for the referral and allowing [place] to share in the care of your patient.   Clint Guy, [place], [facility] Licensed, Certified Dispensing [email or web address] Phone: [phone]  The patient was seen for a total of 25 minutes in face-to-face genetic counseling.  This patient was discussed with Drs. Magrinat, Lindi Adie and/or Burr Medico who agrees with the above.    _______________________________________________________________________ For Office Staff:  Number of people  involved in session: 1 Was an Intern/ student involved with case: no

## 2020-12-27 NOTE — Progress Notes (Signed)
[facility] Psychosocial Distress Screening Counseling Intern  Counseling intern was referred by distress screening protocol.  The patient scored a 7 on the Psychosocial Distress Thermometer which indicates severe distress. Counseling intern met with patient in exam room" to assess for distress and other psychosocial needs. The patient attended clinic with her husband, who is part of her good support system. The patient reported support from her family, friends, church, and workplace. The patient's distress lowered to around a three after clinic, as she said she was "ready" to get started and trying to stay positive. She reported feeling supported by the [place]. Anxiety and the unknown were causing her distress, but she reported feeling better after clinic.   ONCBCN DISTRESS SCREENING 12/27/2020  Screening Type Initial Screening  Distress experienced in past week (1-10) 7  Emotional problem type Nervousness/Anxiety;Adjusting to appearance changes  Physical Problem type Pain;Sleep/insomnia;Skin dry/itchy  Referral to support programs Yes    Follow up needed: No.   Gaylyn Rong Counseling Intern

## 2020-12-28 ENCOUNTER — Telehealth: Payer: Self-pay | Admitting: Radiology

## 2020-12-28 ENCOUNTER — Telehealth: Payer: Self-pay | Admitting: Hematology and Oncology

## 2020-12-28 NOTE — Telephone Encounter (Signed)
[place] [address]: TREATMENT OF REFRACTORY NAUSEA  12/28/20    11:15AM  PHONE CALL: Confirmed I was speaking with Maria Kennedy. Informed patient call was concerning potential participation in the above mentioned trial. Patient stated she was interested in participating and having a consent visit. Consent visit is scheduled for 1PM on Monday 3/21 prior to chemo education. Patient was thanked for her time and I look forward to speaking with her on Monday.   Carol Ada, RT(R)(T) Clinical Research Coordinator

## 2020-12-28 NOTE — Telephone Encounter (Signed)
Scheduled per 03/18 scheduled message, patient should be aware of 03/21 appointments.

## 2020-12-31 ENCOUNTER — Inpatient Hospital Stay: Payer: 59

## 2020-12-31 ENCOUNTER — Ambulatory Visit (HOSPITAL_COMMUNITY): Payer: 59

## 2020-12-31 ENCOUNTER — Other Ambulatory Visit: Payer: Self-pay

## 2020-12-31 ENCOUNTER — Encounter: Payer: Self-pay | Admitting: Hematology and Oncology

## 2020-12-31 ENCOUNTER — Inpatient Hospital Stay: Payer: 59 | Admitting: Radiology

## 2020-12-31 DIAGNOSIS — C50412 Malignant neoplasm of upper-outer quadrant of left female breast: Secondary | ICD-10-CM

## 2020-12-31 NOTE — Research (Signed)
[place] [address]: TREATMENT OF REFRACTORY NAUSEA  12/31/20   12:50PM  CONSENT:  Patient Maria Kennedy was identified by Dr. Lindi Adie as a potential candidate for the above listed study.  This Clinical Research Coordinator met with Eriko Economos, [id number] on 12/31/20 in a manner and location that ensures patient privacy to discuss participation in the above listed research study.  Patient is Accompanied by her daughter, Maria Kennedy.  Patient was previously provided with informed consent documents.  Patient has not yet read the informed consent documents and so documents were reviewed page by page today.  As outlined in the informed consent form, this Coordinator and Aurie Konopka discussed the purpose of the research study, the investigational nature of the study, study procedures and requirements for study participation, potential risks and benefits of study participation, as well as alternatives to participation.  This study is blinded or double-blinded. The patient understands participation is voluntary and they may withdraw from study participation at any time.  Each study arm was reviewed, and randomization discussed.  Potential side effects were reviewed with patient as outlined in the consent form, and patient made aware there may be side effects not yet known. The chance of receiving placebo was discussed. Patient understands enrollment is pending full eligibility review.   Confidentiality and how the patient's information will be used as part of study participation were discussed.  Patient was informed there is not reimbursement provided for their time and effort spent on trial participation.  The patient is encouraged to discuss research study participation with their insurance provider to determine what costs they may incur as part of study participation, including research related injury.    All questions were answered to patient's satisfaction.  The informed consent and separate HIPAA Authorization  was reviewed page by page.  The patient's mental and emotional status is appropriate to provide informed consent, and the patient verbalizes an understanding of study participation.  Patient has agreed to participate in the above listed research study and has voluntarily signed the informed consent version and separate HIPAA Authorization, version dated 04/29/2019 and HIPA version dated 12/29/2016 on 12/31/20 at 1:19 PM.  The patient was provided with a copy of the signed informed consent form and separate HIPAA Authorization for their reference.  No study specific procedures were obtained prior to the signing of the informed consent document.  Approximately 40 minutes were spent with the patient reviewing the informed consent documents. Patient was thanked for her time and her participation in the above mentioned study.  Patient did confirm she would like to complete questionnaires via e-mail. A blank copy of baseline questionnaires along with a pre-paid envelope were given to patient just in case she does not receive them via e-mail.   Menopausal status (women only): Maria Kennedy is pre-menopausal with LMP 12/11/2020 and has had a tubal ligation.  Medications were reviewed with patient and patient confirms the use of Ativan is PRN and was last taken 2-3 day prior to today's date. Patient stated she is willing to discontinue the use of Ativan until completion of study.   01/01/21    3:30PM  PHONE CALL: Confirmed I was speaking with Maria Kennedy. Called to verify usage of Ativan. Patient confirmed the date prescription was filled, and confirmed the number of pills she has taken. Due to PRN use for greater than 5 days over a 30 day period, patient will be ineligible for study. Informed patient of ineligibility; patient expressed her understanding and thanked this coordinator for the update.  Patient was thanked for her time.   Carol Ada, RT(R)(T) Clinical Research Coordinator

## 2020-12-31 NOTE — Progress Notes (Signed)
Met with patient at registration to introduce myself as Arboriculturist and to offer available resources.  Discussed one-time $1000 Radio broadcast assistant to assist with personal expenses while going through treatment. Also, discussed available copay assistance for specific treatment drugs.  Gave her my card if interested in applying and for any additional financial questions or concerns.

## 2021-01-01 ENCOUNTER — Encounter (HOSPITAL_COMMUNITY)
Admission: RE | Admit: 2021-01-01 | Discharge: 2021-01-01 | Disposition: A | Discharge status: Home / Self Care | Payer: 59 | Source: Ambulatory Visit | Attending: Hematology and Oncology | Admitting: Hematology and Oncology

## 2021-01-01 ENCOUNTER — Encounter (HOSPITAL_BASED_OUTPATIENT_CLINIC_OR_DEPARTMENT_OTHER): Payer: Self-pay | Admitting: General Surgery

## 2021-01-01 DIAGNOSIS — C50412 Malignant neoplasm of upper-outer quadrant of left female breast: Secondary | ICD-10-CM | POA: Insufficient documentation

## 2021-01-01 DIAGNOSIS — Z171 Estrogen receptor negative status [ER-]: Secondary | ICD-10-CM | POA: Insufficient documentation

## 2021-01-01 MED ORDER — TECHNETIUM TC 99M MEDRONATE IV KIT
21.1000 | PACK | Freq: Once | INTRAVENOUS | Status: AC
  Administered 2021-01-01: 21.1 via INTRAVENOUS

## 2021-01-01 NOTE — Progress Notes (Signed)
Pharmacist Chemotherapy Monitoring - Initial Assessment    Anticipated start date: 01/08/21  Regimen:  . Are orders appropriate based on the patient's diagnosis, regimen, and cycle? Yes . Does the plan date match the patient's scheduled date? Yes . Is the sequencing of drugs appropriate? Yes . Are the premedications appropriate for the patient's regimen? Yes . Prior Authorization for treatment is: Pending o If applicable, is the correct biosimilar selected based on the patient's insurance? yes  Organ Function and Labs: Marland Kitchen Are dose adjustments needed based on the patient's renal function, hepatic function, or hematologic function? Yes . Are appropriate labs ordered prior to the start of patient's treatment? Yes . Other organ system assessment, if indicated: trastuzumab: Echo/ MUGA . The following baseline labs, if indicated, have been ordered: N/A  Dose Assessment: . Are the drug doses appropriate? Yes . Are the following correct: o Drug concentrations Yes o IV fluid compatible with drug Yes o Administration routes Yes o Timing of therapy Yes . If applicable, does the patient have documented access for treatment and/or plans for port-a-cath placement? yes . If applicable, have lifetime cumulative doses been properly documented and assessed? not applicable Lifetime Dose Tracking  No doses have been documented on this patient for the following tracked chemicals: Doxorubicin, Epirubicin, Idarubicin, Daunorubicin, Mitoxantrone, Bleomycin, Oxaliplatin, Carboplatin, Liposomal Doxorubicin  o   Toxicity Monitoring/Prevention: . The patient has the following take home antiemetics prescribed: Prochlorperazine . The patient has the following take home medications prescribed: N/A . Medication allergies and previous infusion related reactions, if applicable, have been reviewed and addressed. Yes . The patient's current medication list has been assessed for drug-drug interactions with their  chemotherapy regimen. no significant drug-drug interactions were identified on review.  Order Review: . Are the treatment plan orders signed? Yes . Is the patient scheduled to see a provider prior to their treatment? Yes  I verify that I have reviewed each item in the above checklist and answered each question accordingly.  Maria Kennedy, [place], 01/01/2021  11:34 AM

## 2021-01-01 NOTE — Progress Notes (Signed)
      Enhanced Recovery after Surgery for Orthopedics Enhanced Recovery after Surgery is a protocol used to improve the stress on your body and your recovery after surgery.  Patient Instructions  . The night before surgery:  o No food after midnight. ONLY clear liquids after midnight  . The day of surgery (if you do NOT have diabetes):  o Drink ONE (1) Pre-Surgery Clear Ensure as directed.   o This drink was given to you during your hospital  pre-op appointment visit. o The pre-op nurse will instruct you on the time to drink the  Pre-Surgery Ensure depending on your surgery time. o Finish the drink at the designated time by the pre-op nurse.  o Nothing else to drink after completing the  Pre-Surgery Clear Ensure.  . The day of surgery (if you have diabetes): o Drink ONE (1) Gatorade 2 (G2) as directed. o This drink was given to you during your hospital  pre-op appointment visit.  o The pre-op nurse will instruct you on the time to drink the   Gatorade 2 (G2) depending on your surgery time. o Color of the Gatorade may vary. Red is not allowed. o Nothing else to drink after completing the  Gatorade 2 (G2).         If you have questions, please contact your surgeon's office.  Surgical soap given with instructions. Pt verbalizes understanding.

## 2021-01-02 ENCOUNTER — Other Ambulatory Visit: Payer: Self-pay

## 2021-01-02 ENCOUNTER — Ambulatory Visit (HOSPITAL_COMMUNITY)
Admission: RE | Admit: 2021-01-02 | Discharge: 2021-01-02 | Disposition: A | Discharge status: Home / Self Care | Payer: 59 | Source: Ambulatory Visit | Attending: Hematology and Oncology | Admitting: Hematology and Oncology

## 2021-01-02 ENCOUNTER — Encounter: Payer: Self-pay | Admitting: *Deleted

## 2021-01-02 DIAGNOSIS — I340 Nonrheumatic mitral (valve) insufficiency: Secondary | ICD-10-CM | POA: Insufficient documentation

## 2021-01-02 DIAGNOSIS — Z171 Estrogen receptor negative status [ER-]: Secondary | ICD-10-CM | POA: Diagnosis present

## 2021-01-02 DIAGNOSIS — C50412 Malignant neoplasm of upper-outer quadrant of left female breast: Secondary | ICD-10-CM | POA: Insufficient documentation

## 2021-01-02 DIAGNOSIS — Z0189 Encounter for other specified special examinations: Secondary | ICD-10-CM | POA: Diagnosis not present

## 2021-01-02 LAB — ECHOCARDIOGRAM COMPLETE
Area-P 1/2: 4.21 cm2
S' Lateral: 2.5 cm

## 2021-01-02 NOTE — Progress Notes (Signed)
  Echocardiogram 2D Echocardiogram has been performed.  Maria Kennedy 01/02/2021, 9:58 AM

## 2021-01-03 ENCOUNTER — Telehealth: Payer: Self-pay | Admitting: *Deleted

## 2021-01-03 ENCOUNTER — Ambulatory Visit (HOSPITAL_COMMUNITY)
Admission: RE | Admit: 2021-01-03 | Discharge: 2021-01-03 | Disposition: A | Discharge status: Home / Self Care | Payer: 59 | Source: Ambulatory Visit | Attending: Hematology and Oncology | Admitting: Hematology and Oncology

## 2021-01-03 ENCOUNTER — Other Ambulatory Visit: Payer: Self-pay

## 2021-01-03 ENCOUNTER — Encounter: Payer: Self-pay | Admitting: *Deleted

## 2021-01-03 ENCOUNTER — Encounter: Payer: Self-pay | Admitting: Dietician

## 2021-01-03 ENCOUNTER — Other Ambulatory Visit (HOSPITAL_COMMUNITY)
Admission: RE | Admit: 2021-01-03 | Discharge: 2021-01-03 | Disposition: A | Discharge status: Home / Self Care | Payer: 59 | Source: Ambulatory Visit | Attending: General Surgery | Admitting: General Surgery

## 2021-01-03 DIAGNOSIS — Z01812 Encounter for preprocedural laboratory examination: Secondary | ICD-10-CM | POA: Diagnosis present

## 2021-01-03 DIAGNOSIS — C50412 Malignant neoplasm of upper-outer quadrant of left female breast: Secondary | ICD-10-CM | POA: Diagnosis not present

## 2021-01-03 DIAGNOSIS — Z171 Estrogen receptor negative status [ER-]: Secondary | ICD-10-CM

## 2021-01-03 DIAGNOSIS — Z20822 Contact with and (suspected) exposure to covid-19: Secondary | ICD-10-CM | POA: Insufficient documentation

## 2021-01-03 LAB — SARS CORONAVIRUS 2 (TAT 6-24 HRS): SARS Coronavirus 2: NEGATIVE

## 2021-01-03 MED ORDER — IOHEXOL 300 MG/ML  SOLN
100.0000 mL | Freq: Once | INTRAMUSCULAR | Status: AC | PRN
  Administered 2021-01-03: 100 mL via INTRAVENOUS

## 2021-01-03 MED ORDER — IOHEXOL 300 MG/ML  SOLN: 75.0000 mL | Freq: Once | INTRAMUSCULAR | Status: DC | PRN

## 2021-01-03 MED ORDER — IOPAMIDOL (ISOVUE-300) INJECTION 61%: 100.0000 mL | Freq: Once | INTRAVENOUS | Status: DC | PRN

## 2021-01-03 NOTE — Progress Notes (Signed)
Nutrition Assessment   Reason for Assessment: MST    ASSESSMENT: 50 year old female with newly diagnosed breast cancer. She is pending start of neoadjuvant TCH Perjeta x 6 cycles on 3/29. Patient  followed by Dr. Lindi Adie.  Past medical history of ADHD, depression  Assessment completed with patient via phone. She reports she has lost a couple of pounds over the past few weeks secondary to stress. Normally, she eats well and has a good appetite. Patient is hungry and placing on-line order at a sub shop during our conversation. She recalls 4 nuggets from [facility] yesterday after Echo and a hamburger last night. Patient reports her daughter has been trying to prepare healthier meals since her diagnosis and asking what foods are best for her to eat.   Nutrition Focused Physical Exam: unable to complete  Medications: Adderall XR, Ferrous sulfate, Ativan, Vit K2, Vit D   Labs: 3/16: Glucose 119   Anthropometrics: Weight 170 lb 3.1 oz on 3/22 decreased 5 lbs (2.9%) from usual 175 lb on 2/10; insignificant  Height: 5'3.5" Weight: 170 lb 3.1 oz on 3/22 UBW: 175-180 lbs  BMI: 29.68   NUTRITION DIAGNOSIS: Food and nutrition knowledge deficit related to new cancer diagnosis as evidenced by no prior need for related nutrition education   INTERVENTION:  Educated on complimentary nutrition services provided  Discussed balanced nutrition and the importance of protein  Educated on good sources of lean proteins Fact sheets were mailed to patient today RD contact information provided Encouraged patient to call with nutrition concerns   MONITORING, EVALUATION, GOAL:  Patient will consume adequate calories and protein to minimize weight loss during treatment   Next Visit: To be scheduled with treatment as needed

## 2021-01-03 NOTE — Telephone Encounter (Signed)
Spoke with patient to follow up from [facility] 3/16 and assess navigation needs.  Patient is aware of all her appointments and denies any questions at this time.  Encouraged her to call should anything arise.

## 2021-01-04 ENCOUNTER — Other Ambulatory Visit: Payer: Self-pay | Admitting: *Deleted

## 2021-01-04 ENCOUNTER — Encounter: Payer: Self-pay | Admitting: *Deleted

## 2021-01-07 ENCOUNTER — Encounter (HOSPITAL_BASED_OUTPATIENT_CLINIC_OR_DEPARTMENT_OTHER)
Admission: RE | Disposition: A | Discharge status: Home / Self Care | Payer: Self-pay | Source: Home / Self Care | Attending: General Surgery

## 2021-01-07 ENCOUNTER — Encounter (HOSPITAL_BASED_OUTPATIENT_CLINIC_OR_DEPARTMENT_OTHER): Payer: Self-pay | Admitting: General Surgery

## 2021-01-07 ENCOUNTER — Ambulatory Visit (HOSPITAL_BASED_OUTPATIENT_CLINIC_OR_DEPARTMENT_OTHER)
Admission: RE | Admit: 2021-01-07 | Discharge: 2021-01-07 | Disposition: A | Discharge status: Home / Self Care | Payer: 59 | Attending: General Surgery | Admitting: General Surgery

## 2021-01-07 ENCOUNTER — Ambulatory Visit (HOSPITAL_COMMUNITY): Payer: 59

## 2021-01-07 ENCOUNTER — Ambulatory Visit (HOSPITAL_BASED_OUTPATIENT_CLINIC_OR_DEPARTMENT_OTHER): Payer: 59 | Admitting: Anesthesiology

## 2021-01-07 ENCOUNTER — Other Ambulatory Visit: Payer: Self-pay

## 2021-01-07 DIAGNOSIS — Z800 Family history of malignant neoplasm of digestive organs: Secondary | ICD-10-CM | POA: Insufficient documentation

## 2021-01-07 DIAGNOSIS — C50912 Malignant neoplasm of unspecified site of left female breast: Secondary | ICD-10-CM | POA: Insufficient documentation

## 2021-01-07 DIAGNOSIS — Z419 Encounter for procedure for purposes other than remedying health state, unspecified: Secondary | ICD-10-CM

## 2021-01-07 DIAGNOSIS — Z1501 Genetic susceptibility to malignant neoplasm of breast: Secondary | ICD-10-CM | POA: Diagnosis not present

## 2021-01-07 DIAGNOSIS — Z809 Family history of malignant neoplasm, unspecified: Secondary | ICD-10-CM | POA: Insufficient documentation

## 2021-01-07 HISTORY — PX: PORTACATH PLACEMENT: SHX2246

## 2021-01-07 HISTORY — DX: Unspecified asthma, uncomplicated: J45.909

## 2021-01-07 LAB — POCT PREGNANCY, URINE: Preg Test, Ur: NEGATIVE

## 2021-01-07 SURGERY — INSERTION, TUNNELED CENTRAL VENOUS DEVICE, WITH PORT
Anesthesia: General | Site: Breast | Laterality: Right

## 2021-01-07 MED ORDER — HEPARIN SOD (PORK) LOCK FLUSH 100 UNIT/ML IV SOLN
INTRAVENOUS | Status: DC | PRN
  Administered 2021-01-07: 400 [IU] via INTRAVENOUS

## 2021-01-07 MED ORDER — CEFAZOLIN SODIUM-DEXTROSE 2-4 GM/100ML-% IV SOLN
2.0000 g | INTRAVENOUS | Status: AC
  Administered 2021-01-07: 2 g via INTRAVENOUS

## 2021-01-07 MED ORDER — ONDANSETRON HCL 4 MG/2ML IJ SOLN: 4.0000 mg | Freq: Four times a day (QID) | INTRAMUSCULAR | Status: DC | PRN

## 2021-01-07 MED ORDER — HEPARIN (PORCINE) IN NACL 2-0.9 UNITS/ML
INTRAMUSCULAR | Status: AC | PRN
  Administered 2021-01-07: 1 via INTRAVENOUS

## 2021-01-07 MED ORDER — ONDANSETRON HCL 4 MG/2ML IJ SOLN
INTRAMUSCULAR | Status: AC
  Filled 2021-01-07: qty 2

## 2021-01-07 MED ORDER — HEPARIN SOD (PORK) LOCK FLUSH 100 UNIT/ML IV SOLN
INTRAVENOUS | Status: AC
  Filled 2021-01-07: qty 5

## 2021-01-07 MED ORDER — PROPOFOL 10 MG/ML IV BOLUS
INTRAVENOUS | Status: AC
  Filled 2021-01-07: qty 20

## 2021-01-07 MED ORDER — LACTATED RINGERS IV SOLN: INTRAVENOUS | Status: DC

## 2021-01-07 MED ORDER — ACETAMINOPHEN 500 MG PO TABS
ORAL_TABLET | ORAL | Status: AC
  Filled 2021-01-07: qty 2

## 2021-01-07 MED ORDER — DEXAMETHASONE SODIUM PHOSPHATE 10 MG/ML IJ SOLN
INTRAMUSCULAR | Status: DC | PRN
  Administered 2021-01-07: 5 mg via INTRAVENOUS

## 2021-01-07 MED ORDER — HEPARIN (PORCINE) IN NACL 1000-0.9 UT/500ML-% IV SOLN
INTRAVENOUS | Status: AC
  Filled 2021-01-07: qty 500

## 2021-01-07 MED ORDER — ACETAMINOPHEN 500 MG PO TABS
1000.0000 mg | ORAL_TABLET | ORAL | Status: AC
  Administered 2021-01-07: 1000 mg via ORAL

## 2021-01-07 MED ORDER — FENTANYL CITRATE (PF) 100 MCG/2ML IJ SOLN
25.0000 ug | INTRAMUSCULAR | Status: DC | PRN
  Administered 2021-01-07: 50 ug via INTRAVENOUS
  Administered 2021-01-07: 25 ug via INTRAVENOUS

## 2021-01-07 MED ORDER — BUPIVACAINE HCL (PF) 0.25 % IJ SOLN
INTRAMUSCULAR | Status: DC | PRN
  Administered 2021-01-07: 7 mL

## 2021-01-07 MED ORDER — OXYCODONE HCL 5 MG/5ML PO SOLN: 5.0000 mg | Freq: Once | ORAL | Status: AC | PRN

## 2021-01-07 MED ORDER — ONDANSETRON HCL 4 MG/2ML IJ SOLN
INTRAMUSCULAR | Status: DC | PRN
  Administered 2021-01-07: 4 mg via INTRAVENOUS

## 2021-01-07 MED ORDER — OXYCODONE HCL 5 MG PO TABS
5.0000 mg | ORAL_TABLET | Freq: Once | ORAL | Status: AC | PRN
  Administered 2021-01-07: 5 mg via ORAL

## 2021-01-07 MED ORDER — PROPOFOL 10 MG/ML IV BOLUS
INTRAVENOUS | Status: DC | PRN
  Administered 2021-01-07: 200 mg via INTRAVENOUS

## 2021-01-07 MED ORDER — CEFAZOLIN SODIUM-DEXTROSE 2-4 GM/100ML-% IV SOLN
INTRAVENOUS | Status: AC
  Filled 2021-01-07: qty 100

## 2021-01-07 MED ORDER — LIDOCAINE HCL (CARDIAC) PF 100 MG/5ML IV SOSY
PREFILLED_SYRINGE | INTRAVENOUS | Status: DC | PRN
  Administered 2021-01-07: 50 mg via INTRATRACHEAL

## 2021-01-07 MED ORDER — FENTANYL CITRATE (PF) 100 MCG/2ML IJ SOLN
INTRAMUSCULAR | Status: DC | PRN
  Administered 2021-01-07: 50 ug via INTRAVENOUS

## 2021-01-07 MED ORDER — OXYCODONE HCL 5 MG PO TABS
ORAL_TABLET | ORAL | Status: AC
  Filled 2021-01-07: qty 1

## 2021-01-07 MED ORDER — ENSURE PRE-SURGERY PO LIQD: 296.0000 mL | Freq: Once | ORAL | Status: DC

## 2021-01-07 MED ORDER — BUPIVACAINE HCL (PF) 0.25 % IJ SOLN
INTRAMUSCULAR | Status: AC
  Filled 2021-01-07: qty 30

## 2021-01-07 MED ORDER — FENTANYL CITRATE (PF) 100 MCG/2ML IJ SOLN
INTRAMUSCULAR | Status: AC
  Filled 2021-01-07: qty 2

## 2021-01-07 MED ORDER — MIDAZOLAM HCL 5 MG/5ML IJ SOLN
INTRAMUSCULAR | Status: DC | PRN
  Administered 2021-01-07: 2 mg via INTRAVENOUS

## 2021-01-07 MED ORDER — MIDAZOLAM HCL 2 MG/2ML IJ SOLN
INTRAMUSCULAR | Status: AC
  Filled 2021-01-07: qty 2

## 2021-01-07 MED ORDER — TRAMADOL HCL 50 MG PO TABS: 50.0000 mg | ORAL_TABLET | Freq: Four times a day (QID) | ORAL | 0 refills | Status: DC | PRN

## 2021-01-07 MED ORDER — PHENYLEPHRINE 40 MCG/ML (10ML) SYRINGE FOR IV PUSH (FOR BLOOD PRESSURE SUPPORT)
PREFILLED_SYRINGE | INTRAVENOUS | Status: AC
  Filled 2021-01-07: qty 10

## 2021-01-07 MED ORDER — PHENYLEPHRINE HCL (PRESSORS) 10 MG/ML IV SOLN
INTRAVENOUS | Status: DC | PRN
  Administered 2021-01-07 (×2): 80 ug via INTRAVENOUS

## 2021-01-07 SURGICAL SUPPLY — 50 items
ADH SKN CLS APL DERMABOND .7 (GAUZE/BANDAGES/DRESSINGS) ×1
APL PRP STRL LF DISP 70% ISPRP (MISCELLANEOUS) ×1
APL SKNCLS STERI-STRIP NONHPOA (GAUZE/BANDAGES/DRESSINGS) ×1
BAG DECANTER FOR FLEXI CONT (MISCELLANEOUS) ×2 IMPLANT
BENZOIN TINCTURE PRP APPL 2/3 (GAUZE/BANDAGES/DRESSINGS) ×2 IMPLANT
BLADE SURG 11 STRL SS (BLADE) ×2 IMPLANT
BLADE SURG 15 STRL LF DISP TIS (BLADE) ×1 IMPLANT
BLADE SURG 15 STRL SS (BLADE) ×2
CANISTER SUCT 1200ML W/VALVE (MISCELLANEOUS) IMPLANT
CHLORAPREP W/TINT 26 (MISCELLANEOUS) ×2 IMPLANT
COVER BACK TABLE 60X90IN (DRAPES) ×2 IMPLANT
COVER MAYO STAND STRL (DRAPES) ×2 IMPLANT
COVER PROBE 5X48 (MISCELLANEOUS)
COVER WAND RF STERILE (DRAPES) IMPLANT
DECANTER SPIKE VIAL GLASS SM (MISCELLANEOUS) IMPLANT
DERMABOND ADVANCED (GAUZE/BANDAGES/DRESSINGS) ×1
DERMABOND ADVANCED .7 DNX12 (GAUZE/BANDAGES/DRESSINGS) ×1 IMPLANT
DRAPE C-ARM 42X72 X-RAY (DRAPES) ×2 IMPLANT
DRAPE LAPAROSCOPIC ABDOMINAL (DRAPES) ×2 IMPLANT
DRAPE UTILITY XL STRL (DRAPES) ×2 IMPLANT
DRSG TEGADERM 4X4.75 (GAUZE/BANDAGES/DRESSINGS) IMPLANT
ELECT COATED BLADE 2.86 ST (ELECTRODE) ×2 IMPLANT
ELECT REM PT RETURN 9FT ADLT (ELECTROSURGICAL) ×2
ELECTRODE REM PT RTRN 9FT ADLT (ELECTROSURGICAL) ×1 IMPLANT
GAUZE SPONGE 4X4 12PLY STRL LF (GAUZE/BANDAGES/DRESSINGS) ×2 IMPLANT
GLOVE SURG ENC MOIS LTX SZ7 (GLOVE) ×2 IMPLANT
GLOVE SURG UNDER POLY LF SZ7.5 (GLOVE) ×2 IMPLANT
GOWN STRL REUS W/ TWL LRG LVL3 (GOWN DISPOSABLE) ×2 IMPLANT
GOWN STRL REUS W/TWL LRG LVL3 (GOWN DISPOSABLE) ×4
IV KIT MINILOC 20X1 SAFETY (NEEDLE) IMPLANT
KIT CVR 48X5XPRB PLUP LF (MISCELLANEOUS) IMPLANT
KIT PORT POWER ISP 8FR (Stent) ×2 IMPLANT
NDL SAFETY ECLIPSE 18X1.5 (NEEDLE) IMPLANT
NEEDLE HYPO 18GX1.5 SHARP (NEEDLE)
NEEDLE HYPO 25X1 1.5 SAFETY (NEEDLE) ×2 IMPLANT
PACK BASIN DAY SURGERY FS (CUSTOM PROCEDURE TRAY) ×2 IMPLANT
PENCIL SMOKE EVACUATOR (MISCELLANEOUS) ×2 IMPLANT
SLEEVE SCD COMPRESS KNEE MED (STOCKING) ×2 IMPLANT
STRIP CLOSURE SKIN 1/2X4 (GAUZE/BANDAGES/DRESSINGS) ×2 IMPLANT
SUT MNCRL AB 4-0 PS2 18 (SUTURE) ×2 IMPLANT
SUT PROLENE 2 0 SH DA (SUTURE) ×2 IMPLANT
SUT SILK 2 0 TIES 17X18 (SUTURE)
SUT SILK 2-0 18XBRD TIE BLK (SUTURE) IMPLANT
SUT VIC AB 3-0 SH 27 (SUTURE) ×2
SUT VIC AB 3-0 SH 27X BRD (SUTURE) ×1 IMPLANT
SYR 5ML LUER SLIP (SYRINGE) ×2 IMPLANT
SYR CONTROL 10ML LL (SYRINGE) ×2 IMPLANT
TOWEL GREEN STERILE FF (TOWEL DISPOSABLE) ×2 IMPLANT
TUBE CONNECTING 20X1/4 (TUBING) IMPLANT
YANKAUER SUCT BULB TIP NO VENT (SUCTIONS) IMPLANT

## 2021-01-07 NOTE — Discharge Instructions (Signed)
PORT-A-CATH: POST OP INSTRUCTIONS  Always review your discharge instruction sheet given to you by the facility where your surgery was performed.   1. A prescription for pain medication may be given to you upon discharge. Take your pain medication as prescribed, if needed. If narcotic pain medicine is not needed, then you make take acetaminophen (Tylenol) or ibuprofen (Advil) as needed.  2. Take your usually prescribed medications unless otherwise directed. 3. If you need a refill on your pain medication, please contact our office. All narcotic pain medicine now requires a paper prescription.  Phoned in and fax refills are no longer allowed by law.  Prescriptions will not be filled after 5 pm or on weekends.  4. You should follow a light diet for the remainder of the day after your procedure. 5. Most patients will experience some mild swelling and/or bruising in the area of the incision. It may take several days to resolve. 6. It is common to experience some constipation if taking pain medication after surgery. Increasing fluid intake and taking a stool softener (such as Colace) will usually help or prevent this problem from occurring. A mild laxative (Milk of Magnesia or Miralax) should be taken according to package directions if there are no bowel movements after 48 hours.  7. Unless discharge instructions indicate otherwise, you may remove your bandages 48 hours after surgery, and you may shower at that time. You may have steri-strips (small white skin tapes) in place directly over the incision.  These strips should be left on the skin for 7-10 days.  If your surgeon used Dermabond (skin glue) on the incision, you may shower in 24 hours.  The glue will flake off over the next 2-3 weeks.  8. If your port is left accessed at the end of surgery (needle left in port), the dressing cannot get wet and should only by changed by a healthcare professional. When the port is no longer accessed (when the  needle has been removed), follow step 7.   9. ACTIVITIES:  Limit activity involving your arms for the next 72 hours. Do no strenuous exercise or activity for 1 week. You may drive when you are no longer taking prescription pain medication, you can comfortably wear a seatbelt, and you can maneuver your car. 10.You may need to see your doctor in the office for a follow-up appointment.  Please       check with your doctor.  11.When you receive a new Port-a-Cath, you will get a product guide and        ID card.  Please keep them in case you need them.  WHEN TO CALL YOUR DOCTOR [phone]): 1. Fever over 101.0 2. Chills 3. Continued bleeding from incision 4. Increased redness and tenderness at the site 5. Shortness of breath, difficulty breathing   The clinic staff is available to answer your questions during regular business hours. Please don't hesitate to call and ask to speak to one of the nurses or medical assistants for clinical concerns. If you have a medical emergency, go to the nearest emergency room or call 911.  A surgeon from [facility] is always on call at the hospital.     For further information, please visit [email or web address]     Post Anesthesia Home Care Instructions  Activity: Get plenty of rest for the remainder of the day. A responsible individual must stay with you for 24 hours following the procedure.  For the next 24 hours, DO NOT: -Drive  a car -Paediatric nurse -Drink alcoholic beverages -Take any medication unless instructed by your physician -Make any legal decisions or sign important papers.  Meals: Start with liquid foods such as gelatin or soup. Progress to regular foods as tolerated. Avoid greasy, spicy, heavy foods. If nausea and/or vomiting occur, drink only clear liquids until the nausea and/or vomiting subsides. Call your physician if vomiting continues.  Special Instructions/Symptoms: Your throat may feel dry or sore  from the anesthesia or the breathing tube placed in your throat during surgery. If this causes discomfort, gargle with warm salt water. The discomfort should disappear within 24 hours.  If you had a scopolamine patch placed behind your ear for the management of post- operative nausea and/or vomiting:  1. The medication in the patch is effective for 72 hours, after which it should be removed.  Wrap patch in a tissue and discard in the trash. Wash hands thoroughly with soap and water. 2. You may remove the patch earlier than 72 hours if you experience unpleasant side effects which may include dry mouth, dizziness or visual disturbances. 3. Avoid touching the patch. Wash your hands with soap and water after contact with the patch.    Next dose of Tylenol after 4:30pm.

## 2021-01-07 NOTE — Interval H&P Note (Signed)
History and Physical Interval Note:  01/07/2021 11:41 AM  Maria Kennedy  has presented today for surgery, with the diagnosis of BREAST CANCER.  The various methods of treatment have been discussed with the patient and family. After consideration of risks, benefits and other options for treatment, the patient has consented to  Procedure(s): INSERTION PORT-A-CATH (N/A) as a surgical intervention.  The patient's history has been reviewed, patient examined, no change in status, stable for surgery.  I have reviewed the patient's chart and labs.  Questions were answered to the patient's satisfaction.     Rolm Bookbinder

## 2021-01-07 NOTE — Op Note (Signed)
Preoperative diagnosis:left breast cancer, her 2 positive, node positive Postoperative diagnosis: Same as above Procedure: Rightinternal jugular port placement with ultrasound guidance Surgeon: Dr. Serita Grammes Anesthesia: General  Estimated blood loss:minimal Specimens:none Sponge and needlecount was correct atcompletion Drains: None Disposition recovery stable condition  Indications:49 yof who palpated a left breast mass. she has fh of maternal aunt and 3 cousins with breast cancer. on mm there is a large central masslike area that is 5.5x3.6x3.3cm. on [place] 2 cmfn there is a 3.1 cm mass and at 6 cmfn there is a 7 mm mass. there are 2 abnl nodes by [place]. clips from breast are 2.8 cm apart. on mri she has neg right breast, left has 9.5x5.5x4.5 cm mass with 3 abnormal nodes. 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 the node is positive. we discussed primary systemic therapy.   Procedure: After informed consent was obtainedshe was taken to the OR.She was given antibiotics. SCDs were placed. She was placed under general anesthesia without complication. She was prepped and draped in the standard sterile surgical fashion. A surgical timeout was then performed.  I used the ultrasound to identify therightinternal jugular vein. Under ultrasound guidance I then accessed the vein with the needle. I passed the wire. The wire was in the vein both by ultrasound and by fluoroscopy. I thenmade an incisionon herrightchest.I tunneled the line between the 2 sites. I then placed the dilator over the wire. I observed this with fluoroscopy to go in the correct position. I then removedthe wire. I then passed the line. The peel-away sheath was removed. I pulled the line back to be in the superior vena cava.The tip of the line is in the superior vena cavanear the cavoatrial junction.I then attached the port. I sutured this into place with 2-0  Prolene. I then closed this with 3-0 Vicryl and 4-0 Monocryl. Glue was placed. Final fluoroscopic image showed the port to be in good position. I then accessed the port and was able to aspirate blood and packed this with heparin.I placed a dressing and left accessed to begin chemotherapy tomorrow.She tolerated well, was transferred to recovery stable.

## 2021-01-07 NOTE — Anesthesia Procedure Notes (Signed)
Procedure Name: LMA Insertion Date/Time: 01/07/2021 12:01 PM Performed by: Glory Buff, CRNA Pre-anesthesia Checklist: Patient identified, Emergency Drugs available, Suction available and Patient being monitored Patient Re-evaluated:Patient Re-evaluated prior to induction Oxygen Delivery Method: Circle system utilized Preoxygenation: Pre-oxygenation with 100% oxygen Induction Type: IV induction LMA: LMA inserted LMA Size: 4.0 Number of attempts: 1 Placement Confirmation: positive ETCO2 Tube secured with: Tape Dental Injury: Teeth and Oropharynx as per pre-operative assessment

## 2021-01-07 NOTE — Assessment & Plan Note (Signed)
12/19/2020: Palpable left breast mass and left axillary tenderness.  Mammogram revealed conglomeration of masses 3.9 cm and an adjacent 0.7 cm mass and 2 axillary lymph nodes that were abnormal. 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 Neoadjuvant chemotherapy with TCH Perjeta 6 cycles followed by Herceptin Perjeta maintenance versus Kadcyla maintenance (based on response to neoadjuvant chemo) for 1 year 2. Followed by breast conserving surgery and targeted lymph node surgery 3. Followed by adjuvant radiation therapy  -------------------------------------------------------------------------------------------------------------------------------- Current Treatment: Cycle 1 TCHP ECHO 01/02/21: EF 60-65% Labs reviewed RTC in 1 week for tox check

## 2021-01-07 NOTE — Progress Notes (Signed)
Patient Care Team: London Pepper, MD as PCP - General (Family Medicine) Mauro Kaufmann, RN as Oncology Nurse Navigator Rockwell Germany, RN as Oncology Nurse Navigator Rolm Bookbinder, MD as Consulting Physician (General Surgery) Nicholas Lose, MD as Consulting Physician (Hematology and Oncology) Eppie Gibson, MD as Attending Physician (Radiation Oncology)  DIAGNOSIS:    ICD-10-CM   1. Malignant neoplasm of upper-outer quadrant of left breast in female, estrogen receptor negative ([place])  C50.412    Z17.1     SUMMARY OF ONCOLOGIC HISTORY: Oncology History  Malignant neoplasm of upper-outer quadrant of left breast in female, estrogen receptor negative ([place])  12/19/2020 Initial Diagnosis   Patient palpated a left breast mass and noted left axilla tenderness for one week. Diagnostic mammogram and US showed a conglomeration of masses in the left breast at the 2 o'clock position 3-4cm from the nipple spanning 3.9cm total, with a 0.7cm mass at the 2 o'clock position 6cm from the nipple, and two abnormal axillary lymph nodes. 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   12/26/2020 Cancer Staging   Staging form: Breast, AJCC 8th Edition - Clinical stage from 12/26/2020: Stage IIIA (cT3, cN1, cM0, G3, ER-, PR-, HER2+) - Signed by Nicholas Lose, MD on 12/26/2020 Stage prefix: Initial diagnosis Histologic grading system: 3 grade system   01/08/2021 -  Chemotherapy    Patient is on Treatment Plan: BREAST  DOCETAXEL + CARBOPLATIN + TRASTUZUMAB + PERTUZUMAB  (TCHP) Q21D         CHIEF COMPLIANT: Cycle 1 Day 1 TCH Perjeta  INTERVAL HISTORY: Maria Kennedy is a 50 y.o. with above-mentioned history of left breast cancer currently on neoadjuvant chemotherapy with TCH Perjeta. Echo on 01/02/21 showed an ejection fraction of 60-65%. Bone scan on 01/01/21 showed no evidence of osseous metastatic disease. CT CAP on 01/03/21 showed the known  left breast mass and no evidence of metastatic disease. Her port was placed by Dr. Donne Hazel on 01/01/21. She presents to the clinic today for treatment.   ALLERGIES:  is allergic to latex.  MEDICATIONS:  Current Outpatient Medications  Medication Sig Dispense Refill  . acetaminophen (TYLENOL) 650 MG CR tablet Take 650 mg by mouth every 8 (eight) hours as needed for pain.    Marland Kitchen albuterol (VENTOLIN HFA) 108 (90 Base) MCG/ACT inhaler Inhale 2 puffs into the lungs every 6 (six) hours as needed for wheezing or shortness of breath.    . amphetamine-dextroamphetamine (ADDERALL XR) 20 MG 24 hr capsule Take 20 mg by mouth daily as needed.    Marland Kitchen dexamethasone (DECADRON) 4 MG tablet Take 1 tablet (4 mg total) by mouth daily. Take 1 tablet day before chemo and 1 tablet day after chemo with food 12 tablet 0  . ferrous sulfate 325 (65 FE) MG EC tablet Take 325 mg by mouth in the morning and at bedtime.    . lidocaine-prilocaine (EMLA) cream Apply to affected area once 30 g 3  . LORazepam (ATIVAN) 0.5 MG tablet Take 0.5 mg by mouth at bedtime.    . Menaquinone-7 (VITAMIN K2) 40 MCG TABS Take 2 tablets by mouth daily. 80 mcg daily total    . ondansetron (ZOFRAN) 8 MG tablet Take 1 tablet (8 mg total) by mouth 2 (two) times daily as needed (Nausea or vomiting). Start on the third day after chemotherapy. 30 tablet 1  . prochlorperazine (COMPAZINE) 10 MG tablet Take 1 tablet (10 mg total) by mouth every  6 (six) hours as needed (Nausea or vomiting). 30 tablet 1  . traMADol (ULTRAM) 50 MG tablet Take 1 tablet (50 mg total) by mouth every 6 (six) hours as needed. 6 tablet 0  . VITAMIN D PO Take 5,000 Int'l Units by mouth every other day.     No current facility-administered medications for this visit.    PHYSICAL EXAMINATION: ECOG PERFORMANCE STATUS: 1 - Symptomatic but completely ambulatory  Vitals:   01/08/21 0817  BP: 139/84  Pulse: 92  Resp: 17  Temp: (!) 97.5 F (36.4 C)  SpO2: 100%   Filed Weights    01/08/21 0817  Weight: 175 lb 11.2 oz (79.7 kg)    LABORATORY DATA:  I have reviewed the data as listed CMP Latest Ref Rng & Units 12/26/2020  Glucose 70 - 99 mg/dL 119(H)  BUN 6 - 20 mg/dL 9  Creatinine 0.44 - 1.00 mg/dL 0.90  Sodium 135 - 145 mmol/L 139  Potassium 3.5 - 5.1 mmol/L 3.8  Chloride 98 - 111 mmol/L 105  CO2 22 - 32 mmol/L 26  Calcium 8.9 - 10.3 mg/dL 9.3  Total Protein 6.5 - 8.1 g/dL 7.6  Total Bilirubin 0.3 - 1.2 mg/dL 0.4  Alkaline Phos 38 - 126 U/L 50  AST 15 - 41 U/L 19  ALT 0 - 44 U/L 26    Lab Results  Component Value Date   WBC 7.0 12/26/2020   HGB 11.8 (L) 12/26/2020   HCT 37.3 12/26/2020   MCV 87.1 12/26/2020   PLT 410 (H) 12/26/2020   NEUTROABS 4.4 12/26/2020    ASSESSMENT & PLAN:  Malignant neoplasm of upper-outer quadrant of left breast in female, estrogen receptor negative ([place]) 12/19/2020: Palpable left breast mass and left axillary tenderness.  Mammogram revealed conglomeration of masses 3.9 cm and an adjacent 0.7 cm mass and 2 axillary lymph nodes that were abnormal. Biopsy revealed IDC with DCIS, grade 3, ER/PR negative, HER-2 +3+ by [facility]  12/24/2020: Breast MRI revealed mass or non-mass enhancement measuring 9.5 cm and 3 abnormal lymph nodes 01-02-21: Bone scan: No bone mets 01/01/2021: CT CAP: No distant metastatic disease  Treatment Plan: 1. Neoadjuvant chemotherapy with TCH Perjeta 6 cycles followed by Herceptin Perjeta maintenance versus Kadcyla maintenance (based on response to neoadjuvant chemo) for 1 year 2. Followed by breast conserving surgery and targeted lymph node surgery 3. Followed by adjuvant radiation therapy  -------------------------------------------------------------------------------------------------------------------------------- Current Treatment: Cycle 1 TCHP ECHO 01/02/21: EF 60-65% Labs reviewed, chemo consent obtained, chemo education completed, antiemetics were reviewed RTC in 1 week for tox check    No  orders of the defined types were placed in this encounter.  The patient has a good understanding of the overall plan. she agrees with it. she will call with any problems that may develop before the next visit here.  Total time spent: 30 mins including face to face time and time spent for planning, charting and coordination of care  Rulon Eisenmenger, MD, MPH 01/08/2021  I, Cloyde Reams Dorshimer, am acting as scribe for Dr. Nicholas Lose.  I have reviewed the above documentation for accuracy and completeness, and I agree with the above.

## 2021-01-07 NOTE — H&P (Signed)
50 yof who palpated a left breast mass. she has fh of maternal aunt and 3 cousins with breast cancer. on mm there is a large central masslike area that is 5.5x3.6x3.3cm. on [place] 2 cmfn there is a 3.1 cm mass and at 6 cmfn there is a 7 mm mass. there are 2 abnl nodes by [place]. clips from breast are 2.8 cm apart. on mri she has neg right breast, left has 9.5x5.5x4.5 cm mass with 3 abnormal nodes. 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 the node is positive. she is here with her husband who works for [employer]. she works. they have five children all in college or later. Her husband played football at [employer] and she is from [place]. she has no prior breast history and is here to discuss options   Past Surgical History Conni Slipper, RN; 12/26/2020 8:12 AM) Breast Biopsy  Left. Colon Polyp Removal - Colonoscopy  Colon Removal - Complete   Diagnostic Studies History Conni Slipper, RN; 12/26/2020 8:12 AM) Colonoscopy  within last year Mammogram  within last year Pap Smear  1-5 years ago  Medication History Conni Slipper, RN; 12/26/2020 8:11 AM) Medications Reconciled  Social History Conni Slipper, RN; 12/26/2020 8:12 AM) Alcohol use  Occasional alcohol use. Caffeine use  Coffee, Tea. No drug use  Tobacco use  Never smoker.  Family History Conni Slipper, RN; 12/26/2020 8:12 AM) Bleeding disorder  Mother. Cancer  Family Members In General. Cerebrovascular Accident  Brother. Diabetes Mellitus  Mother. Heart disease in female family member before age 85  Hypertension  Mother. Malignant Neoplasm Of Pancreas  Family Members In General. Respiratory Condition  Father. Seizure disorder  Mother.  Pregnancy / Birth History Conni Slipper, RN; 12/26/2020 8:12 AM) Age at menarche  50 years. Gravida  3 Maternal age  21-20 Para  2 Regular periods   Other Problems Conni Slipper, RN; 12/26/2020 8:12 AM) Anxiety Disorder  Asthma  Back Pain  Breast Cancer   Gastroesophageal Reflux Disease  Lump In Breast     Review of Systems Conni Slipper RN; 12/26/2020 8:12 AM) General Present- Fatigue. Not Present- Appetite Loss, Chills, Fever, Night Sweats, Weight Gain and Weight Loss. Skin Not Present- Change in Wart/Mole, Dryness, Hives, Jaundice, New Lesions, Non-Healing Wounds, Rash and Ulcer. HEENT Present- Seasonal Allergies and Wears glasses/contact lenses. Not Present- Earache, Hearing Loss, Hoarseness, Nose Bleed, Oral Ulcers, Ringing in the Ears, Sinus Pain, Sore Throat, Visual Disturbances and Yellow Eyes. Respiratory Present- Snoring. Not Present- Bloody sputum, Chronic Cough, Difficulty Breathing and Wheezing. Breast Present- Breast Mass and Skin Changes. Not Present- Breast Pain and Nipple Discharge. Cardiovascular Present- Shortness of Breath. Not Present- Chest Pain, Difficulty Breathing Lying Down, Leg Cramps, Palpitations, Rapid Heart Rate and Swelling of Extremities. Gastrointestinal Present- Excessive gas and Nausea. Not Present- Abdominal Pain, Bloating, Bloody Stool, Change in Bowel Habits, Chronic diarrhea, Constipation, Difficulty Swallowing, Gets full quickly at meals, Hemorrhoids, Indigestion, Rectal Pain and Vomiting. Female Genitourinary Not Present- Frequency, Nocturia, Painful Urination, Pelvic Pain and Urgency. Musculoskeletal Present- Back Pain. Not Present- Joint Pain, Joint Stiffness, Muscle Pain, Muscle Weakness and Swelling of Extremities. Neurological Present- Weakness. Not Present- Decreased Memory, Fainting, Headaches, Numbness, Seizures, Tingling, Tremor and Trouble walking. Psychiatric Present- Anxiety. Not Present- Bipolar, Change in Sleep Pattern, Depression, Fearful and Frequent crying. Endocrine Not Present- Cold Intolerance, Excessive Hunger, Hair Changes, Heat Intolerance, Hot flashes and New Diabetes. Hematology Not Present- Blood Thinners, Easy Bruising, Excessive bleeding,  Gland problems, HIV and Persistent  Infections.   Physical Exam Rolm Bookbinder MD; 12/26/2020 3:19 PM) General Mental Status-Alert. Orientation-Oriented X3.  Breast Nipples-No Discharge. Note: left lateral breast with over6 cm mass not involving skin, mobile no right breast mass, no nac involvement   Lymphatic Head & Neck  General Head & Neck Lymphatics: Bilateral - Description - Normal. Axillary  General Axillary Region: Bilateral - Description - Normal. Note: no Adrian adenopathy     Assessment & Plan Rolm Bookbinder MD; 12/26/2020 3:25 PM) BREAST CANCER METASTASIZED TO AXILLARY LYMPH NODE (C50.919) Story: Genetics, port , staging, primary systemic therapy We discussed the staging and pathophysiology of breast cancer. We discussed all of the different options for treatment for breast cancer including surgery, chemotherapy, radiation therapy, Herceptin, and antiestrogen therapy. we discussed indications for primary systemic therapy and we discussed port placement soon. will plan to start chemo the next day we discussed surgery at end of chemo and this could be lumpectomy (unlikely but wont rule out), mastectomy/nsm. we also discussed alnd vs tad.

## 2021-01-07 NOTE — Transfer of Care (Signed)
Immediate Anesthesia Transfer of Care Note  Patient: Maria Kennedy  Procedure(s) Performed: INSERTION PORT-A-CATH (Right Breast)  Patient Location: PACU  Anesthesia Type:General  Level of Consciousness: drowsy and patient cooperative  Airway & Oxygen Therapy: Patient Spontanous Breathing and Patient connected to face mask oxygen  Post-op Assessment: Report given to RN and Post -op Vital signs reviewed and stable  Post vital signs: Reviewed and stable  Last Vitals:  Vitals Value Taken Time  BP    Temp    Pulse    Resp    SpO2      Last Pain:  Vitals:   01/07/21 1031  TempSrc: Oral  PainSc: 0-No pain      Patients Stated Pain Goal: 4 (30/14/84 0397)  Complications: No complications documented.

## 2021-01-07 NOTE — Anesthesia Preprocedure Evaluation (Addendum)
Anesthesia Evaluation  Patient identified by MRN, date of birth, ID band Patient awake    Reviewed: Allergy & Precautions, H&P , NPO status , Patient's Chart, lab work & pertinent test results  Airway Mallampati: II   Neck ROM: full    Dental   Pulmonary neg pulmonary ROS,    breath sounds clear to auscultation       Cardiovascular negative cardio ROS   Rhythm:regular Rate:Normal     Neuro/Psych PSYCHIATRIC DISORDERS Depression ADHD   GI/Hepatic   Endo/Other    Renal/GU      Musculoskeletal   Abdominal   Peds  Hematology   Anesthesia Other Findings   Reproductive/Obstetrics Breast CA                             Anesthesia Physical Anesthesia Plan  ASA: II  Anesthesia Plan: General   Post-op Pain Management:    Induction: Intravenous  PONV Risk Score and Plan: 3 and Ondansetron, Dexamethasone, Midazolam and Treatment may vary due to age or medical condition  Airway Management Planned: LMA  Additional Equipment:   Intra-op Plan:   Post-operative Plan: Extubation in OR  Informed Consent: I have reviewed the patients History and Physical, chart, labs and discussed the procedure including the risks, benefits and alternatives for the proposed anesthesia with the patient or authorized representative who has indicated his/her understanding and acceptance.     Dental advisory given  Plan Discussed with: CRNA, Anesthesiologist and Surgeon  Anesthesia Plan Comments:         Anesthesia Quick Evaluation

## 2021-01-08 ENCOUNTER — Encounter: Payer: Self-pay | Admitting: Radiology

## 2021-01-08 ENCOUNTER — Encounter: Payer: Self-pay | Admitting: *Deleted

## 2021-01-08 ENCOUNTER — Other Ambulatory Visit: Payer: Self-pay

## 2021-01-08 ENCOUNTER — Inpatient Hospital Stay: Payer: 59

## 2021-01-08 ENCOUNTER — Other Ambulatory Visit: Payer: Self-pay | Admitting: Hematology and Oncology

## 2021-01-08 ENCOUNTER — Encounter (HOSPITAL_BASED_OUTPATIENT_CLINIC_OR_DEPARTMENT_OTHER): Payer: Self-pay | Admitting: General Surgery

## 2021-01-08 ENCOUNTER — Inpatient Hospital Stay (HOSPITAL_BASED_OUTPATIENT_CLINIC_OR_DEPARTMENT_OTHER): Payer: 59 | Admitting: Hematology and Oncology

## 2021-01-08 VITALS — BP 136/93 | HR 89 | Temp 98.5°F | Resp 18

## 2021-01-08 DIAGNOSIS — C50412 Malignant neoplasm of upper-outer quadrant of left female breast: Secondary | ICD-10-CM | POA: Diagnosis not present

## 2021-01-08 DIAGNOSIS — Z95828 Presence of other vascular implants and grafts: Secondary | ICD-10-CM

## 2021-01-08 DIAGNOSIS — Z5111 Encounter for antineoplastic chemotherapy: Secondary | ICD-10-CM | POA: Diagnosis not present

## 2021-01-08 DIAGNOSIS — Z171 Estrogen receptor negative status [ER-]: Secondary | ICD-10-CM | POA: Diagnosis not present

## 2021-01-08 MED ORDER — SODIUM CHLORIDE 0.9% FLUSH
10.0000 mL | INTRAVENOUS | Status: DC | PRN
  Administered 2021-01-08: 10 mL via INTRAVENOUS
  Filled 2021-01-08: qty 10

## 2021-01-08 MED ORDER — TRASTUZUMAB-ANNS CHEMO 150 MG IV SOLR
8.0000 mg/kg | Freq: Once | INTRAVENOUS | Status: AC
  Administered 2021-01-08: 609 mg via INTRAVENOUS
  Filled 2021-01-08: qty 29

## 2021-01-08 MED ORDER — SODIUM CHLORIDE 0.9% FLUSH
10.0000 mL | INTRAVENOUS | Status: DC | PRN
  Administered 2021-01-08: 10 mL
  Filled 2021-01-08: qty 10

## 2021-01-08 MED ORDER — PALONOSETRON HCL INJECTION 0.25 MG/5ML
INTRAVENOUS | Status: AC
  Filled 2021-01-08: qty 5

## 2021-01-08 MED ORDER — ACETAMINOPHEN 325 MG PO TABS
ORAL_TABLET | ORAL | Status: AC
  Filled 2021-01-08: qty 2

## 2021-01-08 MED ORDER — SODIUM CHLORIDE 0.9 % IV SOLN
150.0000 mg | Freq: Once | INTRAVENOUS | Status: AC
  Administered 2021-01-08: 150 mg via INTRAVENOUS
  Filled 2021-01-08: qty 150

## 2021-01-08 MED ORDER — DIPHENHYDRAMINE HCL 25 MG PO CAPS
25.0000 mg | ORAL_CAPSULE | Freq: Once | ORAL | Status: AC
  Administered 2021-01-08: 25 mg via ORAL

## 2021-01-08 MED ORDER — SODIUM CHLORIDE 0.9 % IV SOLN
75.0000 mg/m2 | Freq: Once | INTRAVENOUS | Status: AC
  Administered 2021-01-08: 140 mg via INTRAVENOUS
  Filled 2021-01-08: qty 14

## 2021-01-08 MED ORDER — HEPARIN SOD (PORK) LOCK FLUSH 100 UNIT/ML IV SOLN
500.0000 [IU] | Freq: Once | INTRAVENOUS | Status: AC | PRN
  Administered 2021-01-08: 500 [IU]
  Filled 2021-01-08: qty 5

## 2021-01-08 MED ORDER — PALONOSETRON HCL INJECTION 0.25 MG/5ML
0.2500 mg | Freq: Once | INTRAVENOUS | Status: AC
  Administered 2021-01-08: 0.25 mg via INTRAVENOUS

## 2021-01-08 MED ORDER — SODIUM CHLORIDE 0.9 % IV SOLN
700.0000 mg | Freq: Once | INTRAVENOUS | Status: AC
  Administered 2021-01-08: 700 mg via INTRAVENOUS
  Filled 2021-01-08: qty 70

## 2021-01-08 MED ORDER — SODIUM CHLORIDE 0.9 % IV SOLN
Freq: Once | INTRAVENOUS | Status: AC
  Filled 2021-01-08: qty 250

## 2021-01-08 MED ORDER — SODIUM CHLORIDE 0.9 % IV SOLN
420.0000 mg | Freq: Once | INTRAVENOUS | Status: AC
  Administered 2021-01-08: 420 mg via INTRAVENOUS
  Filled 2021-01-08: qty 14

## 2021-01-08 MED ORDER — DIPHENHYDRAMINE HCL 25 MG PO CAPS
ORAL_CAPSULE | ORAL | Status: AC
  Filled 2021-01-08: qty 1

## 2021-01-08 MED ORDER — ACETAMINOPHEN 325 MG PO TABS
650.0000 mg | ORAL_TABLET | Freq: Once | ORAL | Status: AC
  Administered 2021-01-08: 650 mg via ORAL

## 2021-01-08 MED ORDER — SODIUM CHLORIDE 0.9 % IV SOLN
10.0000 mg | Freq: Once | INTRAVENOUS | Status: AC
  Administered 2021-01-08: 10 mg via INTRAVENOUS
  Filled 2021-01-08: qty 10

## 2021-01-08 NOTE — Patient Instructions (Signed)
Implanted Port Insertion, Care After This sheet gives you information about how to care for yourself after your procedure. Your health care provider may also give you more specific instructions. If you have problems or questions, contact your health care provider. What can I expect after the procedure? After the procedure, it is common to have:  Discomfort at the port insertion site.  Bruising on the skin over the port. This should improve over 3-4 days. Follow these instructions at home: Port care  After your port is placed, you will get a manufacturer's information card. The card has information about your port. Keep this card with you at all times.  Take care of the port as told by your health care provider. Ask your health care provider if you or a family member can get training for taking care of the port at home. A home health care nurse may also take care of the port.  Make sure to remember what type of port you have. Incision care  Follow instructions from your health care provider about how to take care of your port insertion site. Make sure you: ? Wash your hands with soap and water before and after you change your bandage (dressing). If soap and water are not available, use hand sanitizer. ? Change your dressing as told by your health care provider. ? Leave stitches (sutures), skin glue, or adhesive strips in place. These skin closures may need to stay in place for 2 weeks or longer. If adhesive strip edges start to loosen and curl up, you may trim the loose edges. Do not remove adhesive strips completely unless your health care provider tells you to do that.  Check your port insertion site every day for signs of infection. Check for: ? Redness, swelling, or pain. ? Fluid or blood. ? Warmth. ? Pus or a bad smell.      Activity  Return to your normal activities as told by your health care provider. Ask your health care provider what activities are safe for you.  Do not  lift anything that is heavier than 10 lb (4.5 kg), or the limit that you are told, until your health care provider says that it is safe. General instructions  Take over-the-counter and prescription medicines only as told by your health care provider.  Do not take baths, swim, or use a hot tub until your health care provider approves. Ask your health care provider if you may take showers. You may only be allowed to take sponge baths.  Do not drive for 24 hours if you were given a sedative during your procedure.  Wear a medical alert bracelet in case of an emergency. This will tell any health care providers that you have a port.  Keep all follow-up visits as told by your health care provider. This is important. Contact a health care provider if:  You cannot flush your port with saline as directed, or you cannot draw blood from the port.  You have a fever or chills.  You have redness, swelling, or pain around your port insertion site.  You have fluid or blood coming from your port insertion site.  Your port insertion site feels warm to the touch.  You have pus or a bad smell coming from the port insertion site. Get help right away if:  You have chest pain or shortness of breath.  You have bleeding from your port that you cannot control. Summary  Take care of the port as told by your   health care provider. Keep the manufacturer's information card with you at all times.  Change your dressing as told by your health care provider.  Contact a health care provider if you have a fever or chills or if you have redness, swelling, or pain around your port insertion site.  Keep all follow-up visits as told by your health care provider. This information is not intended to replace advice given to you by your health care provider. Make sure you discuss any questions you have with your health care provider. Document Revised: 04/27/2018 Document Reviewed: 04/27/2018 Elsevier Patient Education   2021 Elsevier Inc.  

## 2021-01-08 NOTE — Research (Signed)
DCP-001: Collect Information to [place] [facility] and Clinical Trial Accrual  01/08/21     11:00AM  CONSENT: Met with Lynnley Desrosiers in the infusion room for 10 minutes to discuss the above mentioned trial. Reviewed voluntary nature of the study along with the purpose. Gave patient the consent form (version dated 04/05/2019) and HIPPA authorization form (version 5, dated 11/05/2020) to take home and review. Plan is to speak with patient at a later date to verify interest in participation. Thanked patient for her time.   Carol Ada, RT(R)(T) Clinical Research Coordinator

## 2021-01-08 NOTE — Anesthesia Postprocedure Evaluation (Signed)
Anesthesia Post Note  Patient: Maria Kennedy  Procedure(s) Performed: INSERTION PORT-A-CATH (Right Breast)     Patient location during evaluation: PACU Anesthesia Type: General Level of consciousness: awake and alert Pain management: pain level controlled Vital Signs Assessment: post-procedure vital signs reviewed and stable Respiratory status: spontaneous breathing, nonlabored ventilation, respiratory function stable and patient connected to nasal cannula oxygen Cardiovascular status: blood pressure returned to baseline and stable Postop Assessment: no apparent nausea or vomiting Anesthetic complications: no   No complications documented.  Last Vitals:  Vitals:   01/07/21 1315 01/07/21 1329  BP: 121/86 (!) 123/94  Pulse: 82 79  Resp: 11 16  Temp:  36.6 C  SpO2: 97% 98%    Last Pain:  Vitals:   01/07/21 1315  TempSrc:   PainSc: 2                  Alyssia Heese S

## 2021-01-08 NOTE — Patient Instructions (Signed)
[facility] Discharge Instructions for Patients Receiving Chemotherapy  Today you received the following chemotherapy agents: Kanjinti, Perjeta, Taxotere, Carboplatin.   To help prevent nausea and vomiting after your treatment, we encourage you to take your nausea medication as directed. Carboplatin injection What is this medicine? CARBOPLATIN (KAR boe pla tin) is a chemotherapy drug. It targets fast dividing cells, like cancer cells, and causes these cells to die. This medicine is used to treat ovarian cancer and many other cancers. This medicine may be used for other purposes; ask your health care provider or pharmacist if you have questions. COMMON BRAND NAME(S): Paraplatin What should I tell my health care provider before I take this medicine? They need to know if you have any of these conditions:  blood disorders  hearing problems  kidney disease  recent or ongoing radiation therapy  an unusual or allergic reaction to carboplatin, cisplatin, other chemotherapy, other medicines, foods, dyes, or preservatives  pregnant or trying to get pregnant  breast-feeding How should I use this medicine? This drug is usually given as an infusion into a vein. It is administered in a hospital or clinic by a specially trained health care professional. Talk to your pediatrician regarding the use of this medicine in children. Special care may be needed. Overdosage: If you think you have taken too much of this medicine contact a poison control center or emergency room at once. NOTE: This medicine is only for you. Do not share this medicine with others. What if I miss a dose? It is important not to miss a dose. Call your doctor or health care professional if you are unable to keep an appointment. What may interact with this medicine?  medicines for seizures  medicines to increase blood counts like filgrastim, pegfilgrastim, sargramostim  some antibiotics like amikacin,  gentamicin, neomycin, streptomycin, tobramycin  vaccines Talk to your doctor or health care professional before taking any of these medicines:  acetaminophen  aspirin  ibuprofen  ketoprofen  naproxen This list may not describe all possible interactions. Give your health care provider a list of all the medicines, herbs, non-prescription drugs, or dietary supplements you use. Also tell them if you smoke, drink alcohol, or use illegal drugs. Some items may interact with your medicine. What should I watch for while using this medicine? Your condition will be monitored carefully while you are receiving this medicine. You will need important blood work done while you are taking this medicine. This drug may make you feel generally unwell. This is not uncommon, as chemotherapy can affect healthy cells as well as cancer cells. Report any side effects. Continue your course of treatment even though you feel ill unless your doctor tells you to stop. In some cases, you may be given additional medicines to help with side effects. Follow all directions for their use. Call your doctor or health care professional for advice if you get a fever, chills or sore throat, or other symptoms of a cold or flu. Do not treat yourself. This drug decreases your body's ability to fight infections. Try to avoid being around people who are sick. This medicine may increase your risk to bruise or bleed. Call your doctor or health care professional if you notice any unusual bleeding. Be careful brushing and flossing your teeth or using a toothpick because you may get an infection or bleed more easily. If you have any dental work done, tell your dentist you are receiving this medicine. Avoid taking products that contain aspirin, acetaminophen,  ibuprofen, naproxen, or ketoprofen unless instructed by your doctor. These medicines may hide a fever. Do not become pregnant while taking this medicine. Women should inform their doctor if  they wish to become pregnant or think they might be pregnant. There is a potential for serious side effects to an unborn child. Talk to your health care professional or pharmacist for more information. Do not breast-feed an infant while taking this medicine. What side effects may I notice from receiving this medicine? Side effects that you should report to your doctor or health care professional as soon as possible:  allergic reactions like skin rash, itching or hives, swelling of the face, lips, or tongue  signs of infection - fever or chills, cough, sore throat, pain or difficulty passing urine  signs of decreased platelets or bleeding - bruising, pinpoint red spots on the skin, black, tarry stools, nosebleeds  signs of decreased red blood cells - unusually weak or tired, fainting spells, lightheadedness  breathing problems  changes in hearing  changes in vision  chest pain  high blood pressure  low blood counts - This drug may decrease the number of white blood cells, red blood cells and platelets. You may be at increased risk for infections and bleeding.  nausea and vomiting  pain, swelling, redness or irritation at the injection site  pain, tingling, numbness in the hands or feet  problems with balance, talking, walking  trouble passing urine or change in the amount of urine Side effects that usually do not require medical attention (report to your doctor or health care professional if they continue or are bothersome):  hair loss  loss of appetite  metallic taste in the mouth or changes in taste This list may not describe all possible side effects. Call your doctor for medical advice about side effects. You may report side effects to FDA at 1-800-FDA-1088. Where should I keep my medicine? This drug is given in a hospital or clinic and will not be stored at home. NOTE: This sheet is a summary. It may not cover all possible information. If you have questions about this  medicine, talk to your doctor, pharmacist, or health care provider.  2021 Elsevier/Gold Standard (2008-01-04 14:38:05) Docetaxel injection What is this medicine? DOCETAXEL (doe se TAX el) is a chemotherapy drug. It targets fast dividing cells, like cancer cells, and causes these cells to die. This medicine is used to treat many types of cancers like breast cancer, certain stomach cancers, head and neck cancer, lung cancer, and prostate cancer. This medicine may be used for other purposes; ask your health care provider or pharmacist if you have questions. COMMON BRAND NAME(S): Docefrez, Taxotere What should I tell my health care provider before I take this medicine? They need to know if you have any of these conditions:  infection (especially a virus infection such as chickenpox, cold sores, or herpes)  liver disease  low blood counts, like low white cell, platelet, or red cell counts  an unusual or allergic reaction to docetaxel, polysorbate 80, other chemotherapy agents, other medicines, foods, dyes, or preservatives  pregnant or trying to get pregnant  breast-feeding How should I use this medicine? This drug is given as an infusion into a vein. It is administered in a hospital or clinic by a specially trained health care professional. Talk to your pediatrician regarding the use of this medicine in children. Special care may be needed. Overdosage: If you think you have taken too much of this medicine contact a  poison control center or emergency room at once. NOTE: This medicine is only for you. Do not share this medicine with others. What if I miss a dose? It is important not to miss your dose. Call your doctor or health care professional if you are unable to keep an appointment. What may interact with this medicine? Do not take this medicine with any of the following medications:  live virus vaccines This medicine may also interact with the following  medications:  aprepitant  certain antibiotics like erythromycin or clarithromycin  certain antivirals for HIV or hepatitis  certain medicines for fungal infections like fluconazole, itraconazole, ketoconazole, posaconazole, or voriconazole  cimetidine  ciprofloxacin  conivaptan  cyclosporine  dronedarone  fluvoxamine  grapefruit juice  imatinib  verapamil This list may not describe all possible interactions. Give your health care provider a list of all the medicines, herbs, non-prescription drugs, or dietary supplements you use. Also tell them if you smoke, drink alcohol, or use illegal drugs. Some items may interact with your medicine. What should I watch for while using this medicine? Your condition will be monitored carefully while you are receiving this medicine. You will need important blood work done while you are taking this medicine. Call your doctor or health care professional for advice if you get a fever, chills or sore throat, or other symptoms of a cold or flu. Do not treat yourself. This drug decreases your body's ability to fight infections. Try to avoid being around people who are sick. Some products may contain alcohol. Ask your health care professional if this medicine contains alcohol. Be sure to tell all health care professionals you are taking this medicine. Certain medicines, like metronidazole and disulfiram, can cause an unpleasant reaction when taken with alcohol. The reaction includes flushing, headache, nausea, vomiting, sweating, and increased thirst. The reaction can last from 30 minutes to several hours. You may get drowsy or dizzy. Do not drive, use machinery, or do anything that needs mental alertness until you know how this medicine affects you. Do not stand or sit up quickly, especially if you are an older patient. This reduces the risk of dizzy or fainting spells. Alcohol may interfere with the effect of this medicine. Talk to your health care  professional about your risk of cancer. You may be more at risk for certain types of cancer if you take this medicine. Do not become pregnant while taking this medicine or for 6 months after stopping it. Women should inform their doctor if they wish to become pregnant or think they might be pregnant. There is a potential for serious side effects to an unborn child. Talk to your health care professional or pharmacist for more information. Do not breast-feed an infant while taking this medicine or for 1 week after stopping it. Males who get this medicine must use a condom during sex with females who can get pregnant. If you get a woman pregnant, the baby could have birth defects. The baby could die before they are born. You will need to continue wearing a condom for 3 months after stopping the medicine. Tell your health care provider right away if your partner becomes pregnant while you are taking this medicine. This may interfere with the ability to father a child. You should talk to your doctor or health care professional if you are concerned about your fertility. What side effects may I notice from receiving this medicine? Side effects that you should report to your doctor or health care professional as  soon as possible:  allergic reactions like skin rash, itching or hives, swelling of the face, lips, or tongue  blurred vision  breathing problems  changes in vision  low blood counts - This drug may decrease the number of white blood cells, red blood cells and platelets. You may be at increased risk for infections and bleeding.  nausea and vomiting  pain, redness or irritation at site where injected  pain, tingling, numbness in the hands or feet  redness, blistering, peeling, or loosening of the skin, including inside the mouth  signs of decreased platelets or bleeding - bruising, pinpoint red spots on the skin, black, tarry stools, nosebleeds  signs of decreased red blood cells -  unusually weak or tired, fainting spells, lightheadedness  signs of infection - fever or chills, cough, sore throat, pain or difficulty passing urine  swelling of the ankle, feet, hands Side effects that usually do not require medical attention (report to your doctor or health care professional if they continue or are bothersome):  constipation  diarrhea  fingernail or toenail changes  hair loss  loss of appetite  mouth sores  muscle pain This list may not describe all possible side effects. Call your doctor for medical advice about side effects. You may report side effects to FDA at 1-800-FDA-1088. Where should I keep my medicine? This drug is given in a hospital or clinic and will not be stored at home. NOTE: This sheet is a summary. It may not cover all possible information. If you have questions about this medicine, talk to your doctor, pharmacist, or health care provider.  2021 Elsevier/Gold Standard (2019-08-29 19:50:31) Pertuzumab injection What is this medicine? PERTUZUMAB (per TOOZ ue mab) is a monoclonal antibody. It is used to treat breast cancer. This medicine may be used for other purposes; ask your health care provider or pharmacist if you have questions. COMMON BRAND NAME(S): PERJETA What should I tell my health care provider before I take this medicine? They need to know if you have any of these conditions:  heart disease  heart failure  high blood pressure  history of irregular heart beat  recent or ongoing radiation therapy  an unusual or allergic reaction to pertuzumab, other medicines, foods, dyes, or preservatives  pregnant or trying to get pregnant  breast-feeding How should I use this medicine? This medicine is for infusion into a vein. It is given by a health care professional in a hospital or clinic setting. Talk to your pediatrician regarding the use of this medicine in children. Special care may be needed. Overdosage: If you think you  have taken too much of this medicine contact a poison control center or emergency room at once. NOTE: This medicine is only for you. Do not share this medicine with others. What if I miss a dose? It is important not to miss your dose. Call your doctor or health care professional if you are unable to keep an appointment. What may interact with this medicine? Interactions are not expected. Give your health care provider a list of all the medicines, herbs, non-prescription drugs, or dietary supplements you use. Also tell them if you smoke, drink alcohol, or use illegal drugs. Some items may interact with your medicine. This list may not describe all possible interactions. Give your health care provider a list of all the medicines, herbs, non-prescription drugs, or dietary supplements you use. Also tell them if you smoke, drink alcohol, or use illegal drugs. Some items may interact with your medicine.  What should I watch for while using this medicine? Your condition will be monitored carefully while you are receiving this medicine. Report any side effects. Continue your course of treatment even though you feel ill unless your doctor tells you to stop. Do not become pregnant while taking this medicine or for 7 months after stopping it. Women should inform their doctor if they wish to become pregnant or think they might be pregnant. Women of child-bearing potential will need to have a negative pregnancy test before starting this medicine. There is a potential for serious side effects to an unborn child. Talk to your health care professional or pharmacist for more information. Do not breast-feed an infant while taking this medicine or for 7 months after stopping it. Women must use effective birth control with this medicine. Call your doctor or health care professional for advice if you get a fever, chills or sore throat, or other symptoms of a cold or flu. Do not treat yourself. Try to avoid being around people  who are sick. You may experience fever, chills, and headache during the infusion. Report any side effects during the infusion to your health care professional. What side effects may I notice from receiving this medicine? Side effects that you should report to your doctor or health care professional as soon as possible:  breathing problems  chest pain or palpitations  dizziness  feeling faint or lightheaded  fever or chills  skin rash, itching or hives  sore throat  swelling of the face, lips, or tongue  swelling of the legs or ankles  unusually weak or tired Side effects that usually do not require medical attention (report to your doctor or health care professional if they continue or are bothersome):  diarrhea  hair loss  nausea, vomiting  tiredness This list may not describe all possible side effects. Call your doctor for medical advice about side effects. You may report side effects to FDA at 1-800-FDA-1088. Where should I keep my medicine? This drug is given in a hospital or clinic and will not be stored at home. NOTE: This sheet is a summary. It may not cover all possible information. If you have questions about this medicine, talk to your doctor, pharmacist, or health care provider.  2021 Elsevier/Gold Standard (2015-11-01 12:08:50) Trastuzumab injection for infusion What is this medicine? TRASTUZUMAB (tras TOO zoo mab) is a monoclonal antibody. It is used to treat breast cancer and stomach cancer. This medicine may be used for other purposes; ask your health care provider or pharmacist if you have questions. COMMON BRAND NAME(S): Herceptin, Galvin Proffer, Trazimera What should I tell my health care provider before I take this medicine? They need to know if you have any of these conditions:  heart disease  heart failure  lung or breathing disease, like asthma  an unusual or allergic reaction to trastuzumab, benzyl alcohol, or other  medications, foods, dyes, or preservatives  pregnant or trying to get pregnant  breast-feeding How should I use this medicine? This drug is given as an infusion into a vein. It is administered in a hospital or clinic by a specially trained health care professional. Talk to your pediatrician regarding the use of this medicine in children. This medicine is not approved for use in children. Overdosage: If you think you have taken too much of this medicine contact a poison control center or emergency room at once. NOTE: This medicine is only for you. Do not share this medicine with others. What  if I miss a dose? It is important not to miss a dose. Call your doctor or health care professional if you are unable to keep an appointment. What may interact with this medicine? This medicine may interact with the following medications:  certain types of chemotherapy, such as daunorubicin, doxorubicin, epirubicin, and idarubicin This list may not describe all possible interactions. Give your health care provider a list of all the medicines, herbs, non-prescription drugs, or dietary supplements you use. Also tell them if you smoke, drink alcohol, or use illegal drugs. Some items may interact with your medicine. What should I watch for while using this medicine? Visit your doctor for checks on your progress. Report any side effects. Continue your course of treatment even though you feel ill unless your doctor tells you to stop. Call your doctor or health care professional for advice if you get a fever, chills or sore throat, or other symptoms of a cold or flu. Do not treat yourself. Try to avoid being around people who are sick. You may experience fever, chills and shaking during your first infusion. These effects are usually mild and can be treated with other medicines. Report any side effects during the infusion to your health care professional. Fever and chills usually do not happen with later infusions. Do  not become pregnant while taking this medicine or for 7 months after stopping it. Women should inform their doctor if they wish to become pregnant or think they might be pregnant. Women of child-bearing potential will need to have a negative pregnancy test before starting this medicine. There is a potential for serious side effects to an unborn child. Talk to your health care professional or pharmacist for more information. Do not breast-feed an infant while taking this medicine or for 7 months after stopping it. Women must use effective birth control with this medicine. What side effects may I notice from receiving this medicine? Side effects that you should report to your doctor or health care professional as soon as possible:  allergic reactions like skin rash, itching or hives, swelling of the face, lips, or tongue  chest pain or palpitations  cough  dizziness  feeling faint or lightheaded, falls  fever  general ill feeling or flu-like symptoms  signs of worsening heart failure like breathing problems; swelling in your legs and feet  unusually weak or tired Side effects that usually do not require medical attention (report to your doctor or health care professional if they continue or are bothersome):  bone pain  changes in taste  diarrhea  joint pain  nausea/vomiting  weight loss This list may not describe all possible side effects. Call your doctor for medical advice about side effects. You may report side effects to FDA at 1-800-FDA-1088. Where should I keep my medicine? This drug is given in a hospital or clinic and will not be stored at home. NOTE: This sheet is a summary. It may not cover all possible information. If you have questions about this medicine, talk to your doctor, pharmacist, or health care provider.  2021 Elsevier/Gold Standard (2016-09-23 14:37:52)  If you develop nausea and vomiting that is not controlled by your nausea medication, call the clinic.    BELOW ARE SYMPTOMS THAT SHOULD BE REPORTED IMMEDIATELY:  *FEVER GREATER THAN 100.5 F  *CHILLS WITH OR WITHOUT FEVER  NAUSEA AND VOMITING THAT IS NOT CONTROLLED WITH YOUR NAUSEA MEDICATION  *UNUSUAL SHORTNESS OF BREATH  *UNUSUAL BRUISING OR BLEEDING  TENDERNESS IN MOUTH AND THROAT WITH  OR WITHOUT PRESENCE OF ULCERS  *URINARY PROBLEMS  *BOWEL PROBLEMS  UNUSUAL RASH Items with * indicate a potential emergency and should be followed up as soon as possible.  Feel free to call the clinic should you have any questions or concerns. The clinic phone number is [phone].  Please show the [facility] at check-in to the Emergency Department and triage nurse.

## 2021-01-10 ENCOUNTER — Inpatient Hospital Stay: Payer: 59

## 2021-01-10 ENCOUNTER — Other Ambulatory Visit: Payer: Self-pay

## 2021-01-10 VITALS — BP 130/98 | HR 74 | Temp 98.2°F | Resp 17

## 2021-01-10 DIAGNOSIS — Z171 Estrogen receptor negative status [ER-]: Secondary | ICD-10-CM

## 2021-01-10 DIAGNOSIS — Z5111 Encounter for antineoplastic chemotherapy: Secondary | ICD-10-CM | POA: Diagnosis not present

## 2021-01-10 MED ORDER — PEGFILGRASTIM-BMEZ 6 MG/0.6ML ~~LOC~~ SOSY
6.0000 mg | PREFILLED_SYRINGE | Freq: Once | SUBCUTANEOUS | Status: AC
  Administered 2021-01-10: 6 mg via SUBCUTANEOUS

## 2021-01-10 NOTE — Patient Instructions (Signed)
Pegfilgrastim injection What is this medicine? PEGFILGRASTIM (PEG fil gra stim) is a long-acting granulocyte colony-stimulating factor that stimulates the growth of neutrophils, a type of white blood cell important in the body's fight against infection. It is used to reduce the incidence of fever and infection in patients with certain types of cancer who are receiving chemotherapy that affects the bone marrow, and to increase survival after being exposed to high doses of radiation. This medicine may be used for other purposes; ask your health care provider or pharmacist if you have questions. COMMON BRAND NAME(S): Fulphila, Neulasta, Nyvepria, UDENYCA, Ziextenzo What should I tell my health care provider before I take this medicine? They need to know if you have any of these conditions:  kidney disease  latex allergy  ongoing radiation therapy  sickle cell disease  skin reactions to acrylic adhesives (On-Body Injector only)  an unusual or allergic reaction to pegfilgrastim, filgrastim, other medicines, foods, dyes, or preservatives  pregnant or trying to get pregnant  breast-feeding How should I use this medicine? This medicine is for injection under the skin. If you get this medicine at home, you will be taught how to prepare and give the pre-filled syringe or how to use the On-body Injector. Refer to the patient Instructions for Use for detailed instructions. Use exactly as directed. Tell your healthcare provider immediately if you suspect that the On-body Injector may not have performed as intended or if you suspect the use of the On-body Injector resulted in a missed or partial dose. It is important that you put your used needles and syringes in a special sharps container. Do not put them in a trash can. If you do not have a sharps container, call your pharmacist or healthcare provider to get one. Talk to your pediatrician regarding the use of this medicine in children. While this drug  may be prescribed for selected conditions, precautions do apply. Overdosage: If you think you have taken too much of this medicine contact a poison control center or emergency room at once. NOTE: This medicine is only for you. Do not share this medicine with others. What if I miss a dose? It is important not to miss your dose. Call your doctor or health care professional if you miss your dose. If you miss a dose due to an On-body Injector failure or leakage, a new dose should be administered as soon as possible using a single prefilled syringe for manual use. What may interact with this medicine? Interactions have not been studied. This list may not describe all possible interactions. Give your health care provider a list of all the medicines, herbs, non-prescription drugs, or dietary supplements you use. Also tell them if you smoke, drink alcohol, or use illegal drugs. Some items may interact with your medicine. What should I watch for while using this medicine? Your condition will be monitored carefully while you are receiving this medicine. You may need blood work done while you are taking this medicine. Talk to your health care provider about your risk of cancer. You may be more at risk for certain types of cancer if you take this medicine. If you are going to need a MRI, CT scan, or other procedure, tell your doctor that you are using this medicine (On-Body Injector only). What side effects may I notice from receiving this medicine? Side effects that you should report to your doctor or health care professional as soon as possible:  allergic reactions (skin rash, itching or hives, swelling of   the face, lips, or tongue)  back pain  dizziness  fever  pain, redness, or irritation at site where injected  pinpoint red spots on the skin  red or dark-brown urine  shortness of breath or breathing problems  stomach or side pain, or pain at the shoulder  swelling  tiredness  trouble  passing urine or change in the amount of urine  unusual bruising or bleeding Side effects that usually do not require medical attention (report to your doctor or health care professional if they continue or are bothersome):  bone pain  muscle pain This list may not describe all possible side effects. Call your doctor for medical advice about side effects. You may report side effects to FDA at 1-800-FDA-1088. Where should I keep my medicine? Keep out of the reach of children. If you are using this medicine at home, you will be instructed on how to store it. Throw away any unused medicine after the expiration date on the label. NOTE: This sheet is a summary. It may not cover all possible information. If you have questions about this medicine, talk to your doctor, pharmacist, or health care provider.  2021 Elsevier/Gold Standard (2019-10-21 13:20:51)  

## 2021-01-11 ENCOUNTER — Encounter: Payer: Self-pay | Admitting: Hematology and Oncology

## 2021-01-11 ENCOUNTER — Telehealth: Payer: Self-pay | Admitting: *Deleted

## 2021-01-11 NOTE — Telephone Encounter (Signed)
Attempted to call patient to return a message she left. No answer and no voicemail. I was not able to leave message.

## 2021-01-14 NOTE — Progress Notes (Signed)
Patient Care Team: London Pepper, MD as PCP - General (Family Medicine) Mauro Kaufmann, RN as Oncology Nurse Navigator Rockwell Germany, RN as Oncology Nurse Navigator Rolm Bookbinder, MD as Consulting Physician (General Surgery) Nicholas Lose, MD as Consulting Physician (Hematology and Oncology) Eppie Gibson, MD as Attending Physician (Radiation Oncology)  DIAGNOSIS:    ICD-10-CM   1. Malignant neoplasm of upper-outer quadrant of left breast in female, estrogen receptor negative ([place])  C50.412    Z17.1     SUMMARY OF ONCOLOGIC HISTORY: Oncology History  Malignant neoplasm of upper-outer quadrant of left breast in female, estrogen receptor negative ([place])  12/19/2020 Initial Diagnosis   Patient palpated a left breast mass and noted left axilla tenderness for one week. Diagnostic mammogram and US showed a conglomeration of masses in the left breast at the 2 o'clock position 3-4cm from the nipple spanning 3.9cm total, with a 0.7cm mass at the 2 o'clock position 6cm from the nipple, and two abnormal axillary lymph nodes. 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   12/26/2020 Cancer Staging   Staging form: Breast, AJCC 8th Edition - Clinical stage from 12/26/2020: Stage IIIA (cT3, cN1, cM0, G3, ER-, PR-, HER2+) - Signed by Nicholas Lose, MD on 12/26/2020 Stage prefix: Initial diagnosis Histologic grading system: 3 grade system   01/08/2021 -  Chemotherapy    Patient is on Treatment Plan: BREAST  DOCETAXEL + CARBOPLATIN + TRASTUZUMAB + PERTUZUMAB  (TCHP) Q21D         CHIEF COMPLIANT: Cycle 1 Day 8 TCH Perjeta  INTERVAL HISTORY: Maria Kennedy is a 50 y.o. with above-mentioned history of left breast cancer currently on neoadjuvant chemotherapy with TCH Perjeta. She presents to the clinic today for a toxicity check following cycle 1.   Her major toxicities of chemotherapy were diarrhea, diffuse bone pain for 48 hours.   Mild nausea.  Decreased taste and appetite.  ALLERGIES:  is allergic to latex.  MEDICATIONS:  Current Outpatient Medications  Medication Sig Dispense Refill  . acetaminophen (TYLENOL) 650 MG CR tablet Take 650 mg by mouth every 8 (eight) hours as needed for pain.    Marland Kitchen albuterol (VENTOLIN HFA) 108 (90 Base) MCG/ACT inhaler Inhale 2 puffs into the lungs every 6 (six) hours as needed for wheezing or shortness of breath.    . amphetamine-dextroamphetamine (ADDERALL XR) 20 MG 24 hr capsule Take 20 mg by mouth daily as needed.    Marland Kitchen dexamethasone (DECADRON) 4 MG tablet Take 1 tablet (4 mg total) by mouth daily. Take 1 tablet day before chemo and 1 tablet day after chemo with food 12 tablet 0  . lidocaine-prilocaine (EMLA) cream Apply to affected area once 30 g 3  . LORazepam (ATIVAN) 0.5 MG tablet Take 0.5 mg by mouth at bedtime.    . Menaquinone-7 (VITAMIN K2) 40 MCG TABS Take 2 tablets by mouth daily. 80 mcg daily total    . ondansetron (ZOFRAN) 8 MG tablet Take 1 tablet (8 mg total) by mouth 2 (two) times daily as needed (Nausea or vomiting). Start on the third day after chemotherapy. 30 tablet 1  . prochlorperazine (COMPAZINE) 10 MG tablet Take 1 tablet (10 mg total) by mouth every 6 (six) hours as needed (Nausea or vomiting). 30 tablet 1  . traMADol (ULTRAM) 50 MG tablet Take 1 tablet (50 mg total) by mouth every 6 (six) hours as needed. 6 tablet 0  . VITAMIN D PO Take  5,000 Int'l Units by mouth every other day.     No current facility-administered medications for this visit.    PHYSICAL EXAMINATION: ECOG PERFORMANCE STATUS: 1 - Symptomatic but completely ambulatory  Vitals:   01/15/21 0951  BP: 123/79  Pulse: (!) 113  Resp: 18  Temp: 97.7 F (36.5 C)  SpO2: 100%   Filed Weights   01/15/21 0951  Weight: 169 lb 6.4 oz (76.8 kg)    LABORATORY DATA:  I have reviewed the data as listed CMP Latest Ref Rng & Units 01/15/2021 12/26/2020  Glucose 70 - 99 mg/dL 104(H) 119(H)  BUN 6 - 20  mg/dL 13 9  Creatinine 0.44 - 1.00 mg/dL 0.81 0.90  Sodium 135 - 145 mmol/L 138 139  Potassium 3.5 - 5.1 mmol/L 4.6 3.8  Chloride 98 - 111 mmol/L 104 105  CO2 22 - 32 mmol/L 22 26  Calcium 8.9 - 10.3 mg/dL 8.5(L) 9.3  Total Protein 6.5 - 8.1 g/dL 7.1 7.6  Total Bilirubin 0.3 - 1.2 mg/dL <0.2(L) 0.4  Alkaline Phos 38 - 126 U/L 55 50  AST 15 - 41 U/L 21 19  ALT 0 - 44 U/L 24 26    Lab Results  Component Value Date   WBC 9.3 01/15/2021   HGB 11.3 (L) 01/15/2021   HCT 35.7 (L) 01/15/2021   MCV 86.7 01/15/2021   PLT 354 01/15/2021   NEUTROABS PENDING 01/15/2021    ASSESSMENT & PLAN:  Malignant neoplasm of upper-outer quadrant of left breast in female, estrogen receptor negative ([place]) 12/19/2020: Palpable left breast mass and left axillary tenderness. Mammogram revealed conglomeration of masses 3.9 cm and an adjacent 0.7 cm mass and 2 axillary lymph nodes that were abnormal. Biopsy revealed IDC with DCIS, grade 3, ER/PR negative, HER-2 +3+ by [facility]  12/24/2020: Breast MRI revealed mass or non-mass enhancement measuring 9.5 cm and 3 abnormal lymph nodes 01-02-21: Bone scan: No bone mets 01/01/2021: CT CAP: No distant metastatic disease  Treatment Plan: 1. Neoadjuvant chemotherapy with TCH Perjeta 6 cycles followed by Herceptin Perjeta maintenance versus Kadcyla maintenance (based on response to neoadjuvant chemo) for 1 year 2. Followed by breast conserving surgeryand targeted lymph node surgery 3. Followed by adjuvant radiation therapy -------------------------------------------------------------------------------------------------------------------------------- Current Treatment: Cycle 1 day 8 TCHP ECHO 01/02/21: EF 60-65%   Chemo toxicities: 1.  Severe bone pain related to Neulasta: Subsided after 48 hours but it was quite severe 2. diarrhea: Intermittently: Patient does not want to take Imodium because she is worried about constipation. 3.  Decreased taste and appetite    Dosage will remain the same for cycle 2  Return to clinic in 2 weeks for cycle 2 I will see her back with cycle 3   No orders of the defined types were placed in this encounter.  The patient has a good understanding of the overall plan. she agrees with it. she will call with any problems that may develop before the next visit here.  Total time spent: 30 mins including face to face time and time spent for planning, charting and coordination of care  Rulon Eisenmenger, MD, MPH 01/15/2021  I, Cloyde Reams Dorshimer, am acting as scribe for Dr. Nicholas Lose.  I have reviewed the above documentation for accuracy and completeness, and I agree with the above.

## 2021-01-15 ENCOUNTER — Other Ambulatory Visit: Payer: Self-pay

## 2021-01-15 ENCOUNTER — Inpatient Hospital Stay: Payer: 59

## 2021-01-15 ENCOUNTER — Encounter: Payer: Self-pay | Admitting: Radiology

## 2021-01-15 ENCOUNTER — Inpatient Hospital Stay: Payer: 59 | Attending: Hematology and Oncology | Admitting: Hematology and Oncology

## 2021-01-15 ENCOUNTER — Encounter: Payer: Self-pay | Admitting: *Deleted

## 2021-01-15 DIAGNOSIS — Z5111 Encounter for antineoplastic chemotherapy: Secondary | ICD-10-CM | POA: Diagnosis present

## 2021-01-15 DIAGNOSIS — C50412 Malignant neoplasm of upper-outer quadrant of left female breast: Secondary | ICD-10-CM | POA: Diagnosis present

## 2021-01-15 DIAGNOSIS — Z79899 Other long term (current) drug therapy: Secondary | ICD-10-CM | POA: Diagnosis not present

## 2021-01-15 DIAGNOSIS — R630 Anorexia: Secondary | ICD-10-CM | POA: Insufficient documentation

## 2021-01-15 DIAGNOSIS — Z5189 Encounter for other specified aftercare: Secondary | ICD-10-CM | POA: Diagnosis not present

## 2021-01-15 DIAGNOSIS — R110 Nausea: Secondary | ICD-10-CM | POA: Diagnosis not present

## 2021-01-15 DIAGNOSIS — Z5112 Encounter for antineoplastic immunotherapy: Secondary | ICD-10-CM | POA: Diagnosis present

## 2021-01-15 DIAGNOSIS — Z95828 Presence of other vascular implants and grafts: Secondary | ICD-10-CM

## 2021-01-15 DIAGNOSIS — Z171 Estrogen receptor negative status [ER-]: Secondary | ICD-10-CM

## 2021-01-15 HISTORY — DX: Presence of other vascular implants and grafts: Z95.828

## 2021-01-15 LAB — CMP (CANCER CENTER ONLY)
ALT: 24 U/L (ref 0–44)
AST: 21 U/L (ref 15–41)
Albumin: 3.9 g/dL (ref 3.5–5.0)
Alkaline Phosphatase: 55 U/L (ref 38–126)
Anion gap: 12 (ref 5–15)
BUN: 13 mg/dL (ref 6–20)
CO2: 22 mmol/L (ref 22–32)
Calcium: 8.5 mg/dL — ABNORMAL LOW (ref 8.9–10.3)
Chloride: 104 mmol/L (ref 98–111)
Creatinine: 0.81 mg/dL (ref 0.44–1.00)
GFR, Estimated: >60 mL/min (ref >60)
Glucose, Bld: 104 mg/dL — ABNORMAL HIGH (ref 70–99)
Potassium: 4.6 mmol/L (ref 3.5–5.1)
Sodium: 138 mmol/L (ref 135–145)
Total Bilirubin: <0.2 mg/dL — ABNORMAL LOW (ref 0.3–1.2)
Total Protein: 7.1 g/dL (ref 6.5–8.1)

## 2021-01-15 LAB — CBC WITH DIFFERENTIAL (CANCER CENTER ONLY)
Abs Immature Granulocytes: 0.6 10*3/uL — ABNORMAL HIGH (ref 0.00–0.07)
Band Neutrophils: 13 %
Basophils Absolute: 0 10*3/uL (ref 0.0–0.1)
Basophils Relative: 0 %
Eosinophils Absolute: 0.2 10*3/uL (ref 0.0–0.5)
Eosinophils Relative: 2 %
HCT: 35.7 % — ABNORMAL LOW (ref 36.0–46.0)
Hemoglobin: 11.3 g/dL — ABNORMAL LOW (ref 12.0–15.0)
Lymphocytes Relative: 30 %
Lymphs Abs: 2.8 10*3/uL (ref 0.7–4.0)
MCH: 27.4 pg (ref 26.0–34.0)
MCHC: 31.7 g/dL (ref 30.0–36.0)
MCV: 86.7 fL (ref 80.0–100.0)
Metamyelocytes Relative: 2 %
Monocytes Absolute: 2.9 10*3/uL — ABNORMAL HIGH (ref 0.1–1.0)
Monocytes Relative: 31 %
Myelocytes: 4 %
Neutro Abs: 2.9 10*3/uL (ref 1.7–7.7)
Neutrophils Relative %: 18 %
Platelet Count: 354 10*3/uL (ref 150–400)
RBC: 4.12 MIL/uL (ref 3.87–5.11)
RDW: 15.1 % (ref 11.5–15.5)
WBC Count: 9.3 10*3/uL (ref 4.0–10.5)
WBC Morphology: INCREASED
nRBC: 0 % (ref 0.0–0.2)

## 2021-01-15 MED ORDER — SODIUM CHLORIDE 0.9% FLUSH
10.0000 mL | Freq: Once | INTRAVENOUS | Status: AC
  Administered 2021-01-15: 10 mL
  Filled 2021-01-15: qty 10

## 2021-01-15 MED ORDER — HEPARIN SOD (PORK) LOCK FLUSH 100 UNIT/ML IV SOLN
500.0000 [IU] | Freq: Once | INTRAVENOUS | Status: AC
  Administered 2021-01-15: 500 [IU]
  Filled 2021-01-15: qty 5

## 2021-01-15 NOTE — Research (Signed)
DCP-001: Use of a Clinical Trial Screening Tool to Address Cancer Health Disparities in the [place] [facility] ([facility])  01/15/2021    9:50AM  VISIT: Met with Maria Kennedy in a private exam room prior to her MD visit. Spoke with patient about the above mentioned trial including the voluntary nature, purpose, and the one time data collection method. Patient was previously given consent and HIPPA documents. Patient prefers not to participate at this time. This clinical research coordinator expressed understanding and thanked patient for her time and opportunity to speak with her.   Carol Ada, RT(R)(T) Clinical Research Coordinator

## 2021-01-15 NOTE — Assessment & Plan Note (Signed)
12/19/2020: Palpable left breast mass and left axillary tenderness. Mammogram revealed conglomeration of masses 3.9 cm and an adjacent 0.7 cm mass and 2 axillary lymph nodes that were abnormal. Biopsy revealed IDC with DCIS, grade 3, ER/PR negative, HER-2 +3+ by [facility]  12/24/2020: Breast MRI revealed mass or non-mass enhancement measuring 9.5 cm and 3 abnormal lymph nodes 01-02-21: Bone scan: No bone mets 01/01/2021: CT CAP: No distant metastatic disease  Treatment Plan: 1. Neoadjuvant chemotherapy with TCH Perjeta 6 cycles followed by Herceptin Perjeta maintenance versus Kadcyla maintenance (based on response to neoadjuvant chemo) for 1 year 2. Followed by breast conserving surgeryand targeted lymph node surgery 3. Followed by adjuvant radiation therapy -------------------------------------------------------------------------------------------------------------------------------- Current Treatment: Cycle 1 day 8 TCHP ECHO 01/02/21: EF 60-65%   Chemo toxicities:  Return to clinic in 2 weeks for cycle 2

## 2021-01-18 DIAGNOSIS — Z1379 Encounter for other screening for genetic and chromosomal anomalies: Secondary | ICD-10-CM | POA: Insufficient documentation

## 2021-01-21 ENCOUNTER — Telehealth: Payer: Self-pay | Admitting: Genetic Counselor

## 2021-01-21 NOTE — Telephone Encounter (Signed)
Attempted to call to discuss genetic test results. No answer and no voicemail to leave message. We will try to call again later.

## 2021-01-23 ENCOUNTER — Encounter: Payer: Self-pay | Admitting: Genetic Counselor

## 2021-01-23 ENCOUNTER — Ambulatory Visit: Payer: Self-pay | Admitting: Genetic Counselor

## 2021-01-23 DIAGNOSIS — Z1379 Encounter for other screening for genetic and chromosomal anomalies: Secondary | ICD-10-CM

## 2021-01-23 NOTE — Progress Notes (Signed)
HPI:  Maria Kennedy was previously seen in the [place] clinic due to a personal and family history of cancer and concerns regarding a hereditary predisposition to cancer. Please refer to our prior cancer genetics clinic note for more information regarding our discussion, assessment and recommendations, at the time. Maria Kennedy recent genetic test results were disclosed to her, as were recommendations warranted by these results. These results and recommendations are discussed in more detail below.  CANCER HISTORY:  Oncology History  Malignant neoplasm of upper-outer quadrant of left breast in female, estrogen receptor negative ([place])  12/19/2020 Initial Diagnosis   Patient palpated a left breast mass and noted left axilla tenderness for one week. Diagnostic mammogram and US showed a conglomeration of masses in the left breast at the 2 o'clock position 3-4cm from the nipple spanning 3.9cm total, with a 0.7cm mass at the 2 o'clock position 6cm from the nipple, and two abnormal axillary lymph nodes. 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   12/26/2020 Cancer Staging   Staging form: Breast, AJCC 8th Edition - Clinical stage from 12/26/2020: Stage IIIA (cT3, cN1, cM0, G3, ER-, PR-, HER2+) - Signed by Nicholas Lose, MD on 12/26/2020 Stage prefix: Initial diagnosis Histologic grading system: 3 grade system   01/08/2021 -  Chemotherapy    Patient is on Treatment Plan: BREAST  DOCETAXEL + CARBOPLATIN + TRASTUZUMAB + PERTUZUMAB  (TCHP) Q21D       01/18/2021 Genetic Testing   Negative genetic testing:  No pathogenic variants detected on the [facility] CancerNext-Expanded + RNAinsight panel. The report date is 01/18/2021.   The CancerNext-Expanded + RNAinsight gene panel offered by [employer] and includes sequencing and rearrangement analysis for the following 77 genes: AIP, ALK, APC, ATM, AXIN2, BAP1, BARD1, BLM, BMPR1A, BRCA1,  BRCA2, BRIP1, CDC73, CDH1, CDK4, CDKN1B, CDKN2A, CHEK2, CTNNA1, DICER1, FANCC, FH, FLCN, GALNT12, KIF1B, LZTR1, MAX, MEN1, MET, MLH1, MSH2, MSH3, MSH6, MUTYH, NBN, NF1, NF2, NTHL1, PALB2, PHOX2B, PMS2, POT1, PRKAR1A, PTCH1, PTEN, RAD51C, RAD51D, RB1, RECQL, RET, SDHA, SDHAF2, SDHB, SDHC, SDHD, SMAD4, SMARCA4, SMARCB1, SMARCE1, STK11, SUFU, TMEM127, TP53, TSC1, TSC2, VHL and XRCC2 (sequencing and deletion/duplication); EGFR, EGLN1, HOXB13, KIT, MITF, PDGFRA, POLD1 and POLE (sequencing only); EPCAM and GREM1 (deletion/duplication only). RNA data is routinely analyzed for use in variant interpretation for all genes.     FAMILY HISTORY:  We obtained a detailed, 4-generation family history.  Significant diagnoses are listed below: Family History  Problem Relation Age of Onset  . Hypertension Mother   . Diabetes Mother   . Pneumonia Mother   . Lung cancer Father 48       smoker  . Other Father        vertigo  . Pancreatic cancer Paternal Grandmother        dx 9s  . Hypertension Maternal Grandmother   . Leukemia Maternal Grandfather 85  . Breast cancer Maternal Aunt        dx >50  . Prostate cancer Paternal Uncle   . Aneurysm Half-Brother 21       DECEASED  . Breast cancer Cousin        dx <50, maternal first cousin  . Breast cancer Cousin        dx <50, maternal first cousin  . Breast cancer Cousin 30       maternal first cousin once removed (cousin's daughter)  . Prostate cancer Maternal Uncle  dx mid 64s  . Prostate cancer Maternal Uncle        dx 25s  . Colon cancer Maternal Uncle        dx 67s  . Rectal cancer Maternal Uncle        dx 14s  . Cancer Maternal Uncle        blood cancer, dx 59s    Maria Kennedy has one daughter (age 55) and one son (age 37). She has two paternal half-brothers (ages 35 and 54), one paternal half-sister (age 10), one maternal half-sister (age 23) and one maternal half-brother (deceased at age 25 from an aneurysm). None of these relatives have  had cancer.  Maria Kennedy mother died at age 47 without cancer. There were five maternal aunts and six maternal uncles. One aunt was diagnosed with breast cancer older than 45. One uncle was diagnosed with prostate cancer in his mid 16s. Another uncle was diagnosed with prostate cancer, blood cancer, colon cancer, and rectal cancer in his 15s. Two maternal first cousins were diagnosed with breast cancer younger than 46. One maternal first cousin once removed (a first cousin's daughter) was diagnosed with breast cancer at age 57. Maria Kennedy maternal grandmother died older than 37 without cancer. Her maternal grandfather died at age 3 from leukemia (diagnosed age 80).   Maria Kennedy father is alive at age 25 and has a history of lung cancer (diagnosed age 46) and was smoker. Her father had at least six paternal half-siblings, although Maria Kennedy does not have information about these relatives. Maria Kennedy paternal grandmother died in her 27s from pancreatic cancer (diagnosed in her 51s). Her paternal grandfather died at age 7 without cancer.  Maria Kennedy is unaware of previous family history of genetic testing for hereditary cancer risks. Patient's maternal ancestors are of Black/African American descent, and paternal ancestors are of Black/African American and White/Caucasian descent. There is no reported Ashkenazi Jewish ancestry. There is no known consanguinity.  GENETIC TEST RESULTS: Genetic testing reported out on 01/18/2021 through the [place] + RNAinsight panel. No pathogenic variants were detected.   The CancerNext-Expanded + RNAinsight gene panel offered by [employer] and includes sequencing and rearrangement analysis for the following 77 genes: AIP, ALK, APC, ATM, AXIN2, BAP1, BARD1, BLM, BMPR1A, BRCA1, BRCA2, BRIP1, CDC73, CDH1, CDK4, CDKN1B, CDKN2A, CHEK2, CTNNA1, DICER1, FANCC, FH, FLCN, GALNT12, KIF1B, LZTR1, MAX, MEN1, MET, MLH1, MSH2, MSH3, MSH6, MUTYH,  NBN, NF1, NF2, NTHL1, PALB2, PHOX2B, PMS2, POT1, PRKAR1A, PTCH1, PTEN, RAD51C, RAD51D, RB1, RECQL, RET, SDHA, SDHAF2, SDHB, SDHC, SDHD, SMAD4, SMARCA4, SMARCB1, SMARCE1, STK11, SUFU, TMEM127, TP53, TSC1, TSC2, VHL and XRCC2 (sequencing and deletion/duplication); EGFR, EGLN1, HOXB13, KIT, MITF, PDGFRA, POLD1 and POLE (sequencing only); EPCAM and GREM1 (deletion/duplication only). RNA data is routinely analyzed for use in variant interpretation for all genes. The test report will be scanned into EPIC and located under the Molecular Pathology section of the Results Review tab.  A portion of the result report is included below for reference.     We discussed with Maria Kennedy that because current genetic testing is not perfect, it is possible there may be a gene mutation in one of these genes that current testing cannot detect, but that chance is small.  We also discussed that there could be another gene that has not yet been discovered, or that we have not yet tested, that is responsible for the cancer diagnoses in the family. It is also possible there is a hereditary cause for the  cancer in the family that Maria Kennedy did not inherit and therefore was not identified in her testing.  Therefore, it is important to remain in touch with cancer genetics in the future so that we can continue to offer Maria Kennedy the most up to date genetic testing.   CANCER SCREENING RECOMMENDATIONS: Maria Kennedy test result is considered negative (normal).  This means that we have not identified a hereditary cause for her personal and family history of cancer at this time. While reassuring, this does not definitively rule out a hereditary predisposition to cancer. It is still possible that there could be genetic mutations that are undetectable by current technology. There could be genetic mutations in genes that have not been tested or identified to increase cancer risk.  Therefore, it is recommended she continue to follow the  cancer management and screening guidelines provided by her oncology and primary healthcare provider.   An individual's cancer risk and medical management are not determined by genetic test results alone. Overall cancer risk assessment incorporates additional factors, including personal medical history, family history, and any available genetic information that may result in a personalized plan for cancer prevention and surveillance.  RECOMMENDATIONS FOR FAMILY MEMBERS:  Individuals in this family might be at some increased risk of developing cancer, over the general population risk, simply due to the family history of cancer.  We recommended women in this family have a yearly mammogram beginning at age 69, or 76 years younger than the earliest onset of cancer, an annual clinical breast exam, and perform monthly breast self-exams. Women in this family should also have a gynecological exam as recommended by their primary provider. All family members should be referred for colonoscopy starting at age 57.  It is also possible there is a hereditary cause for the cancer in Maria Kennedy's family that she did not inherit and therefore was not identified in her.  Based on Maria Kennedy's family history, we recommended her family members, especially those who have had cancer, have genetic counseling and testing. Maria Kennedy will let us know if we can be of any assistance in coordinating genetic counseling and/or testing for these family members.   FOLLOW-UP: Lastly, we discussed with Ms. Bartz that cancer genetics is a rapidly advancing field and it is possible that new genetic tests will be appropriate for her and/or her family members in the future. We encouraged her to remain in contact with cancer genetics on an annual basis so we can update her personal and family histories and let her know of advances in cancer genetics that may benefit this family.   Our contact number was provided. Ms. Youngman  questions were answered to her satisfaction, and she knows she is welcome to call us at anytime with additional questions or concerns.   Clint Guy, MS, [facility] Genetic Counselor [email or web address] Phone: [phone]

## 2021-01-23 NOTE — Telephone Encounter (Signed)
Revealed negative genetic testing. Discussed that we do not know why she has breast cancer or why there is cancer in the family. There could be a genetic mutation in the family that Maria Kennedy did not inherit. There could also be a mutation in a different gene that we are not testing, or our current technology may not be able to detect certain mutations. It will therefore be important for her to stay in contact with genetics to keep up with whether additional testing may be appropriate in the future.

## 2021-01-29 ENCOUNTER — Inpatient Hospital Stay: Payer: 59

## 2021-01-29 ENCOUNTER — Other Ambulatory Visit: Payer: Self-pay

## 2021-01-29 VITALS — BP 135/99 | HR 82 | Temp 98.8°F | Resp 18 | Wt 176.0 lb

## 2021-01-29 DIAGNOSIS — Z95828 Presence of other vascular implants and grafts: Secondary | ICD-10-CM

## 2021-01-29 DIAGNOSIS — C50412 Malignant neoplasm of upper-outer quadrant of left female breast: Secondary | ICD-10-CM

## 2021-01-29 DIAGNOSIS — Z171 Estrogen receptor negative status [ER-]: Secondary | ICD-10-CM

## 2021-01-29 DIAGNOSIS — Z5111 Encounter for antineoplastic chemotherapy: Secondary | ICD-10-CM | POA: Diagnosis not present

## 2021-01-29 LAB — CMP (CANCER CENTER ONLY)
ALT: 44 U/L (ref 0–44)
AST: 16 U/L (ref 15–41)
Albumin: 4.1 g/dL (ref 3.5–5.0)
Alkaline Phosphatase: 70 U/L (ref 38–126)
Anion gap: 9 (ref 5–15)
BUN: 14 mg/dL (ref 6–20)
CO2: 23 mmol/L (ref 22–32)
Calcium: 9.1 mg/dL (ref 8.9–10.3)
Chloride: 108 mmol/L (ref 98–111)
Creatinine: 0.85 mg/dL (ref 0.44–1.00)
GFR, Estimated: >60 mL/min (ref >60)
Glucose, Bld: 144 mg/dL — ABNORMAL HIGH (ref 70–99)
Potassium: 4.4 mmol/L (ref 3.5–5.1)
Sodium: 140 mmol/L (ref 135–145)
Total Bilirubin: <0.2 mg/dL — ABNORMAL LOW (ref 0.3–1.2)
Total Protein: 7 g/dL (ref 6.5–8.1)

## 2021-01-29 LAB — CBC WITH DIFFERENTIAL (CANCER CENTER ONLY)
Abs Immature Granulocytes: 0.03 10*3/uL (ref 0.00–0.07)
Basophils Absolute: 0 10*3/uL (ref 0.0–0.1)
Basophils Relative: 0 %
Eosinophils Absolute: 0 10*3/uL (ref 0.0–0.5)
Eosinophils Relative: 0 %
HCT: 33.1 % — ABNORMAL LOW (ref 36.0–46.0)
Hemoglobin: 10.2 g/dL — ABNORMAL LOW (ref 12.0–15.0)
Immature Granulocytes: 0 %
Lymphocytes Relative: 8 %
Lymphs Abs: 0.8 10*3/uL (ref 0.7–4.0)
MCH: 27.5 pg (ref 26.0–34.0)
MCHC: 30.8 g/dL (ref 30.0–36.0)
MCV: 89.2 fL (ref 80.0–100.0)
Monocytes Absolute: 0.1 10*3/uL (ref 0.1–1.0)
Monocytes Relative: 1 %
Neutro Abs: 8.5 10*3/uL — ABNORMAL HIGH (ref 1.7–7.7)
Neutrophils Relative %: 91 %
Platelet Count: 537 10*3/uL — ABNORMAL HIGH (ref 150–400)
RBC: 3.71 MIL/uL — ABNORMAL LOW (ref 3.87–5.11)
RDW: 16.9 % — ABNORMAL HIGH (ref 11.5–15.5)
WBC Count: 9.4 10*3/uL (ref 4.0–10.5)
nRBC: 0 % (ref 0.0–0.2)

## 2021-01-29 MED ORDER — HEPARIN SOD (PORK) LOCK FLUSH 100 UNIT/ML IV SOLN
500.0000 [IU] | Freq: Once | INTRAVENOUS | Status: AC | PRN
Start: 2021-01-29 — End: 2021-01-29
  Administered 2021-01-29: 500 [IU]
  Filled 2021-01-29: qty 5

## 2021-01-29 MED ORDER — ACETAMINOPHEN 325 MG PO TABS
ORAL_TABLET | ORAL | Status: AC
  Filled 2021-01-29: qty 2

## 2021-01-29 MED ORDER — DIPHENHYDRAMINE HCL 25 MG PO CAPS
25.0000 mg | ORAL_CAPSULE | Freq: Once | ORAL | Status: AC
  Administered 2021-01-29: 25 mg via ORAL

## 2021-01-29 MED ORDER — SODIUM CHLORIDE 0.9 % IV SOLN
Freq: Once | INTRAVENOUS | Status: AC
  Filled 2021-01-29: qty 250

## 2021-01-29 MED ORDER — SODIUM CHLORIDE 0.9 % IV SOLN
700.0000 mg | Freq: Once | INTRAVENOUS | Status: AC
  Administered 2021-01-29: 700 mg via INTRAVENOUS
  Filled 2021-01-29: qty 70

## 2021-01-29 MED ORDER — SODIUM CHLORIDE 0.9 % IV SOLN
6.0000 mg/kg | Freq: Once | INTRAVENOUS | Status: AC
  Administered 2021-01-29: 462 mg via INTRAVENOUS
  Filled 2021-01-29: qty 22

## 2021-01-29 MED ORDER — SODIUM CHLORIDE 0.9% FLUSH
10.0000 mL | INTRAVENOUS | Status: DC | PRN
  Administered 2021-01-29: 10 mL
  Filled 2021-01-29: qty 10

## 2021-01-29 MED ORDER — ACETAMINOPHEN 325 MG PO TABS
650.0000 mg | ORAL_TABLET | Freq: Once | ORAL | Status: AC
Start: 2021-01-29 — End: 2021-01-29
  Administered 2021-01-29: 650 mg via ORAL

## 2021-01-29 MED ORDER — SODIUM CHLORIDE 0.9% FLUSH
10.0000 mL | Freq: Once | INTRAVENOUS | Status: AC
  Administered 2021-01-29: 10 mL
  Filled 2021-01-29: qty 10

## 2021-01-29 MED ORDER — PALONOSETRON HCL INJECTION 0.25 MG/5ML
0.2500 mg | Freq: Once | INTRAVENOUS | Status: AC
  Administered 2021-01-29: 0.25 mg via INTRAVENOUS

## 2021-01-29 MED ORDER — SODIUM CHLORIDE 0.9 % IV SOLN
75.0000 mg/m2 | Freq: Once | INTRAVENOUS | Status: AC
  Administered 2021-01-29: 140 mg via INTRAVENOUS
  Filled 2021-01-29: qty 14

## 2021-01-29 MED ORDER — DIPHENHYDRAMINE HCL 25 MG PO CAPS
ORAL_CAPSULE | ORAL | Status: AC
  Filled 2021-01-29: qty 1

## 2021-01-29 MED ORDER — PALONOSETRON HCL INJECTION 0.25 MG/5ML
INTRAVENOUS | Status: AC
  Filled 2021-01-29: qty 5

## 2021-01-29 MED ORDER — SODIUM CHLORIDE 0.9 % IV SOLN
420.0000 mg | Freq: Once | INTRAVENOUS | Status: AC
  Administered 2021-01-29: 420 mg via INTRAVENOUS
  Filled 2021-01-29: qty 14

## 2021-01-29 MED ORDER — SODIUM CHLORIDE 0.9 % IV SOLN
150.0000 mg | Freq: Once | INTRAVENOUS | Status: AC
  Administered 2021-01-29: 150 mg via INTRAVENOUS
  Filled 2021-01-29: qty 150

## 2021-01-29 MED ORDER — DEXAMETHASONE SODIUM PHOSPHATE 100 MG/10ML IJ SOLN
10.0000 mg | Freq: Once | INTRAMUSCULAR | Status: AC
  Administered 2021-01-29: 10 mg via INTRAVENOUS
  Filled 2021-01-29: qty 10

## 2021-01-29 NOTE — Patient Instructions (Signed)
[facility] Discharge Instructions for Patients Receiving Chemotherapy  Today you received the following chemotherapy agents: Kanjinti, Perjeta, Taxotere, Carboplatin.   To help prevent nausea and vomiting after your treatment, we encourage you to take your nausea medication as directed. Carboplatin injection What is this medicine? CARBOPLATIN (KAR boe pla tin) is a chemotherapy drug. It targets fast dividing cells, like cancer cells, and causes these cells to die. This medicine is used to treat ovarian cancer and many other cancers. This medicine may be used for other purposes; ask your health care provider or pharmacist if you have questions. COMMON BRAND NAME(S): Paraplatin What should I tell my health care provider before I take this medicine? They need to know if you have any of these conditions:  blood disorders  hearing problems  kidney disease  recent or ongoing radiation therapy  an unusual or allergic reaction to carboplatin, cisplatin, other chemotherapy, other medicines, foods, dyes, or preservatives  pregnant or trying to get pregnant  breast-feeding How should I use this medicine? This drug is usually given as an infusion into a vein. It is administered in a hospital or clinic by a specially trained health care professional. Talk to your pediatrician regarding the use of this medicine in children. Special care may be needed. Overdosage: If you think you have taken too much of this medicine contact a poison control center or emergency room at once. NOTE: This medicine is only for you. Do not share this medicine with others. What if I miss a dose? It is important not to miss a dose. Call your doctor or health care professional if you are unable to keep an appointment. What may interact with this medicine?  medicines for seizures  medicines to increase blood counts like filgrastim, pegfilgrastim, sargramostim  some antibiotics like amikacin,  gentamicin, neomycin, streptomycin, tobramycin  vaccines Talk to your doctor or health care professional before taking any of these medicines:  acetaminophen  aspirin  ibuprofen  ketoprofen  naproxen This list may not describe all possible interactions. Give your health care provider a list of all the medicines, herbs, non-prescription drugs, or dietary supplements you use. Also tell them if you smoke, drink alcohol, or use illegal drugs. Some items may interact with your medicine. What should I watch for while using this medicine? Your condition will be monitored carefully while you are receiving this medicine. You will need important blood work done while you are taking this medicine. This drug may make you feel generally unwell. This is not uncommon, as chemotherapy can affect healthy cells as well as cancer cells. Report any side effects. Continue your course of treatment even though you feel ill unless your doctor tells you to stop. In some cases, you may be given additional medicines to help with side effects. Follow all directions for their use. Call your doctor or health care professional for advice if you get a fever, chills or sore throat, or other symptoms of a cold or flu. Do not treat yourself. This drug decreases your body's ability to fight infections. Try to avoid being around people who are sick. This medicine may increase your risk to bruise or bleed. Call your doctor or health care professional if you notice any unusual bleeding. Be careful brushing and flossing your teeth or using a toothpick because you may get an infection or bleed more easily. If you have any dental work done, tell your dentist you are receiving this medicine. Avoid taking products that contain aspirin, acetaminophen,  ibuprofen, naproxen, or ketoprofen unless instructed by your doctor. These medicines may hide a fever. Do not become pregnant while taking this medicine. Women should inform their doctor if  they wish to become pregnant or think they might be pregnant. There is a potential for serious side effects to an unborn child. Talk to your health care professional or pharmacist for more information. Do not breast-feed an infant while taking this medicine. What side effects may I notice from receiving this medicine? Side effects that you should report to your doctor or health care professional as soon as possible:  allergic reactions like skin rash, itching or hives, swelling of the face, lips, or tongue  signs of infection - fever or chills, cough, sore throat, pain or difficulty passing urine  signs of decreased platelets or bleeding - bruising, pinpoint red spots on the skin, black, tarry stools, nosebleeds  signs of decreased red blood cells - unusually weak or tired, fainting spells, lightheadedness  breathing problems  changes in hearing  changes in vision  chest pain  high blood pressure  low blood counts - This drug may decrease the number of white blood cells, red blood cells and platelets. You may be at increased risk for infections and bleeding.  nausea and vomiting  pain, swelling, redness or irritation at the injection site  pain, tingling, numbness in the hands or feet  problems with balance, talking, walking  trouble passing urine or change in the amount of urine Side effects that usually do not require medical attention (report to your doctor or health care professional if they continue or are bothersome):  hair loss  loss of appetite  metallic taste in the mouth or changes in taste This list may not describe all possible side effects. Call your doctor for medical advice about side effects. You may report side effects to FDA at 1-800-FDA-1088. Where should I keep my medicine? This drug is given in a hospital or clinic and will not be stored at home. NOTE: This sheet is a summary. It may not cover all possible information. If you have questions about this  medicine, talk to your doctor, pharmacist, or health care provider.  2021 Elsevier/Gold Standard (2008-01-04 14:38:05) Docetaxel injection What is this medicine? DOCETAXEL (doe se TAX el) is a chemotherapy drug. It targets fast dividing cells, like cancer cells, and causes these cells to die. This medicine is used to treat many types of cancers like breast cancer, certain stomach cancers, head and neck cancer, lung cancer, and prostate cancer. This medicine may be used for other purposes; ask your health care provider or pharmacist if you have questions. COMMON BRAND NAME(S): Docefrez, Taxotere What should I tell my health care provider before I take this medicine? They need to know if you have any of these conditions:  infection (especially a virus infection such as chickenpox, cold sores, or herpes)  liver disease  low blood counts, like low white cell, platelet, or red cell counts  an unusual or allergic reaction to docetaxel, polysorbate 80, other chemotherapy agents, other medicines, foods, dyes, or preservatives  pregnant or trying to get pregnant  breast-feeding How should I use this medicine? This drug is given as an infusion into a vein. It is administered in a hospital or clinic by a specially trained health care professional. Talk to your pediatrician regarding the use of this medicine in children. Special care may be needed. Overdosage: If you think you have taken too much of this medicine contact a  poison control center or emergency room at once. NOTE: This medicine is only for you. Do not share this medicine with others. What if I miss a dose? It is important not to miss your dose. Call your doctor or health care professional if you are unable to keep an appointment. What may interact with this medicine? Do not take this medicine with any of the following medications:  live virus vaccines This medicine may also interact with the following  medications:  aprepitant  certain antibiotics like erythromycin or clarithromycin  certain antivirals for HIV or hepatitis  certain medicines for fungal infections like fluconazole, itraconazole, ketoconazole, posaconazole, or voriconazole  cimetidine  ciprofloxacin  conivaptan  cyclosporine  dronedarone  fluvoxamine  grapefruit juice  imatinib  verapamil This list may not describe all possible interactions. Give your health care provider a list of all the medicines, herbs, non-prescription drugs, or dietary supplements you use. Also tell them if you smoke, drink alcohol, or use illegal drugs. Some items may interact with your medicine. What should I watch for while using this medicine? Your condition will be monitored carefully while you are receiving this medicine. You will need important blood work done while you are taking this medicine. Call your doctor or health care professional for advice if you get a fever, chills or sore throat, or other symptoms of a cold or flu. Do not treat yourself. This drug decreases your body's ability to fight infections. Try to avoid being around people who are sick. Some products may contain alcohol. Ask your health care professional if this medicine contains alcohol. Be sure to tell all health care professionals you are taking this medicine. Certain medicines, like metronidazole and disulfiram, can cause an unpleasant reaction when taken with alcohol. The reaction includes flushing, headache, nausea, vomiting, sweating, and increased thirst. The reaction can last from 30 minutes to several hours. You may get drowsy or dizzy. Do not drive, use machinery, or do anything that needs mental alertness until you know how this medicine affects you. Do not stand or sit up quickly, especially if you are an older patient. This reduces the risk of dizzy or fainting spells. Alcohol may interfere with the effect of this medicine. Talk to your health care  professional about your risk of cancer. You may be more at risk for certain types of cancer if you take this medicine. Do not become pregnant while taking this medicine or for 6 months after stopping it. Women should inform their doctor if they wish to become pregnant or think they might be pregnant. There is a potential for serious side effects to an unborn child. Talk to your health care professional or pharmacist for more information. Do not breast-feed an infant while taking this medicine or for 1 week after stopping it. Males who get this medicine must use a condom during sex with females who can get pregnant. If you get a woman pregnant, the baby could have birth defects. The baby could die before they are born. You will need to continue wearing a condom for 3 months after stopping the medicine. Tell your health care provider right away if your partner becomes pregnant while you are taking this medicine. This may interfere with the ability to father a child. You should talk to your doctor or health care professional if you are concerned about your fertility. What side effects may I notice from receiving this medicine? Side effects that you should report to your doctor or health care professional as  soon as possible:  allergic reactions like skin rash, itching or hives, swelling of the face, lips, or tongue  blurred vision  breathing problems  changes in vision  low blood counts - This drug may decrease the number of white blood cells, red blood cells and platelets. You may be at increased risk for infections and bleeding.  nausea and vomiting  pain, redness or irritation at site where injected  pain, tingling, numbness in the hands or feet  redness, blistering, peeling, or loosening of the skin, including inside the mouth  signs of decreased platelets or bleeding - bruising, pinpoint red spots on the skin, black, tarry stools, nosebleeds  signs of decreased red blood cells -  unusually weak or tired, fainting spells, lightheadedness  signs of infection - fever or chills, cough, sore throat, pain or difficulty passing urine  swelling of the ankle, feet, hands Side effects that usually do not require medical attention (report to your doctor or health care professional if they continue or are bothersome):  constipation  diarrhea  fingernail or toenail changes  hair loss  loss of appetite  mouth sores  muscle pain This list may not describe all possible side effects. Call your doctor for medical advice about side effects. You may report side effects to FDA at 1-800-FDA-1088. Where should I keep my medicine? This drug is given in a hospital or clinic and will not be stored at home. NOTE: This sheet is a summary. It may not cover all possible information. If you have questions about this medicine, talk to your doctor, pharmacist, or health care provider.  2021 Elsevier/Gold Standard (2019-08-29 19:50:31) Pertuzumab injection What is this medicine? PERTUZUMAB (per TOOZ ue mab) is a monoclonal antibody. It is used to treat breast cancer. This medicine may be used for other purposes; ask your health care provider or pharmacist if you have questions. COMMON BRAND NAME(S): PERJETA What should I tell my health care provider before I take this medicine? They need to know if you have any of these conditions:  heart disease  heart failure  high blood pressure  history of irregular heart beat  recent or ongoing radiation therapy  an unusual or allergic reaction to pertuzumab, other medicines, foods, dyes, or preservatives  pregnant or trying to get pregnant  breast-feeding How should I use this medicine? This medicine is for infusion into a vein. It is given by a health care professional in a hospital or clinic setting. Talk to your pediatrician regarding the use of this medicine in children. Special care may be needed. Overdosage: If you think you  have taken too much of this medicine contact a poison control center or emergency room at once. NOTE: This medicine is only for you. Do not share this medicine with others. What if I miss a dose? It is important not to miss your dose. Call your doctor or health care professional if you are unable to keep an appointment. What may interact with this medicine? Interactions are not expected. Give your health care provider a list of all the medicines, herbs, non-prescription drugs, or dietary supplements you use. Also tell them if you smoke, drink alcohol, or use illegal drugs. Some items may interact with your medicine. This list may not describe all possible interactions. Give your health care provider a list of all the medicines, herbs, non-prescription drugs, or dietary supplements you use. Also tell them if you smoke, drink alcohol, or use illegal drugs. Some items may interact with your medicine.  What should I watch for while using this medicine? Your condition will be monitored carefully while you are receiving this medicine. Report any side effects. Continue your course of treatment even though you feel ill unless your doctor tells you to stop. Do not become pregnant while taking this medicine or for 7 months after stopping it. Women should inform their doctor if they wish to become pregnant or think they might be pregnant. Women of child-bearing potential will need to have a negative pregnancy test before starting this medicine. There is a potential for serious side effects to an unborn child. Talk to your health care professional or pharmacist for more information. Do not breast-feed an infant while taking this medicine or for 7 months after stopping it. Women must use effective birth control with this medicine. Call your doctor or health care professional for advice if you get a fever, chills or sore throat, or other symptoms of a cold or flu. Do not treat yourself. Try to avoid being around people  who are sick. You may experience fever, chills, and headache during the infusion. Report any side effects during the infusion to your health care professional. What side effects may I notice from receiving this medicine? Side effects that you should report to your doctor or health care professional as soon as possible:  breathing problems  chest pain or palpitations  dizziness  feeling faint or lightheaded  fever or chills  skin rash, itching or hives  sore throat  swelling of the face, lips, or tongue  swelling of the legs or ankles  unusually weak or tired Side effects that usually do not require medical attention (report to your doctor or health care professional if they continue or are bothersome):  diarrhea  hair loss  nausea, vomiting  tiredness This list may not describe all possible side effects. Call your doctor for medical advice about side effects. You may report side effects to FDA at 1-800-FDA-1088. Where should I keep my medicine? This drug is given in a hospital or clinic and will not be stored at home. NOTE: This sheet is a summary. It may not cover all possible information. If you have questions about this medicine, talk to your doctor, pharmacist, or health care provider.  2021 Elsevier/Gold Standard (2015-11-01 12:08:50) Trastuzumab injection for infusion What is this medicine? TRASTUZUMAB (tras TOO zoo mab) is a monoclonal antibody. It is used to treat breast cancer and stomach cancer. This medicine may be used for other purposes; ask your health care provider or pharmacist if you have questions. COMMON BRAND NAME(S): Herceptin, Galvin Proffer, Trazimera What should I tell my health care provider before I take this medicine? They need to know if you have any of these conditions:  heart disease  heart failure  lung or breathing disease, like asthma  an unusual or allergic reaction to trastuzumab, benzyl alcohol, or other  medications, foods, dyes, or preservatives  pregnant or trying to get pregnant  breast-feeding How should I use this medicine? This drug is given as an infusion into a vein. It is administered in a hospital or clinic by a specially trained health care professional. Talk to your pediatrician regarding the use of this medicine in children. This medicine is not approved for use in children. Overdosage: If you think you have taken too much of this medicine contact a poison control center or emergency room at once. NOTE: This medicine is only for you. Do not share this medicine with others. What  if I miss a dose? It is important not to miss a dose. Call your doctor or health care professional if you are unable to keep an appointment. What may interact with this medicine? This medicine may interact with the following medications:  certain types of chemotherapy, such as daunorubicin, doxorubicin, epirubicin, and idarubicin This list may not describe all possible interactions. Give your health care provider a list of all the medicines, herbs, non-prescription drugs, or dietary supplements you use. Also tell them if you smoke, drink alcohol, or use illegal drugs. Some items may interact with your medicine. What should I watch for while using this medicine? Visit your doctor for checks on your progress. Report any side effects. Continue your course of treatment even though you feel ill unless your doctor tells you to stop. Call your doctor or health care professional for advice if you get a fever, chills or sore throat, or other symptoms of a cold or flu. Do not treat yourself. Try to avoid being around people who are sick. You may experience fever, chills and shaking during your first infusion. These effects are usually mild and can be treated with other medicines. Report any side effects during the infusion to your health care professional. Fever and chills usually do not happen with later infusions. Do  not become pregnant while taking this medicine or for 7 months after stopping it. Women should inform their doctor if they wish to become pregnant or think they might be pregnant. Women of child-bearing potential will need to have a negative pregnancy test before starting this medicine. There is a potential for serious side effects to an unborn child. Talk to your health care professional or pharmacist for more information. Do not breast-feed an infant while taking this medicine or for 7 months after stopping it. Women must use effective birth control with this medicine. What side effects may I notice from receiving this medicine? Side effects that you should report to your doctor or health care professional as soon as possible:  allergic reactions like skin rash, itching or hives, swelling of the face, lips, or tongue  chest pain or palpitations  cough  dizziness  feeling faint or lightheaded, falls  fever  general ill feeling or flu-like symptoms  signs of worsening heart failure like breathing problems; swelling in your legs and feet  unusually weak or tired Side effects that usually do not require medical attention (report to your doctor or health care professional if they continue or are bothersome):  bone pain  changes in taste  diarrhea  joint pain  nausea/vomiting  weight loss This list may not describe all possible side effects. Call your doctor for medical advice about side effects. You may report side effects to FDA at 1-800-FDA-1088. Where should I keep my medicine? This drug is given in a hospital or clinic and will not be stored at home. NOTE: This sheet is a summary. It may not cover all possible information. If you have questions about this medicine, talk to your doctor, pharmacist, or health care provider.  2021 Elsevier/Gold Standard (2016-09-23 14:37:52)  If you develop nausea and vomiting that is not controlled by your nausea medication, call the clinic.    BELOW ARE SYMPTOMS THAT SHOULD BE REPORTED IMMEDIATELY:  *FEVER GREATER THAN 100.5 F  *CHILLS WITH OR WITHOUT FEVER  NAUSEA AND VOMITING THAT IS NOT CONTROLLED WITH YOUR NAUSEA MEDICATION  *UNUSUAL SHORTNESS OF BREATH  *UNUSUAL BRUISING OR BLEEDING  TENDERNESS IN MOUTH AND THROAT WITH  OR WITHOUT PRESENCE OF ULCERS  *URINARY PROBLEMS  *BOWEL PROBLEMS  UNUSUAL RASH Items with * indicate a potential emergency and should be followed up as soon as possible.  Feel free to call the clinic should you have any questions or concerns. The clinic phone number is [phone].  Please show the [facility] at check-in to the Emergency Department and triage nurse.

## 2021-01-29 NOTE — Patient Instructions (Signed)
Implanted Port Insertion, Care After This sheet gives you information about how to care for yourself after your procedure. Your health care provider may also give you more specific instructions. If you have problems or questions, contact your health care provider. What can I expect after the procedure? After the procedure, it is common to have:  Discomfort at the port insertion site.  Bruising on the skin over the port. This should improve over 3-4 days. Follow these instructions at home: Port care  After your port is placed, you will get a manufacturer's information card. The card has information about your port. Keep this card with you at all times.  Take care of the port as told by your health care provider. Ask your health care provider if you or a family member can get training for taking care of the port at home. A home health care nurse may also take care of the port.  Make sure to remember what type of port you have. Incision care  Follow instructions from your health care provider about how to take care of your port insertion site. Make sure you: ? Wash your hands with soap and water before and after you change your bandage (dressing). If soap and water are not available, use hand sanitizer. ? Change your dressing as told by your health care provider. ? Leave stitches (sutures), skin glue, or adhesive strips in place. These skin closures may need to stay in place for 2 weeks or longer. If adhesive strip edges start to loosen and curl up, you may trim the loose edges. Do not remove adhesive strips completely unless your health care provider tells you to do that.  Check your port insertion site every day for signs of infection. Check for: ? Redness, swelling, or pain. ? Fluid or blood. ? Warmth. ? Pus or a bad smell.      Activity  Return to your normal activities as told by your health care provider. Ask your health care provider what activities are safe for you.  Do not  lift anything that is heavier than 10 lb (4.5 kg), or the limit that you are told, until your health care provider says that it is safe. General instructions  Take over-the-counter and prescription medicines only as told by your health care provider.  Do not take baths, swim, or use a hot tub until your health care provider approves. Ask your health care provider if you may take showers. You may only be allowed to take sponge baths.  Do not drive for 24 hours if you were given a sedative during your procedure.  Wear a medical alert bracelet in case of an emergency. This will tell any health care providers that you have a port.  Keep all follow-up visits as told by your health care provider. This is important. Contact a health care provider if:  You cannot flush your port with saline as directed, or you cannot draw blood from the port.  You have a fever or chills.  You have redness, swelling, or pain around your port insertion site.  You have fluid or blood coming from your port insertion site.  Your port insertion site feels warm to the touch.  You have pus or a bad smell coming from the port insertion site. Get help right away if:  You have chest pain or shortness of breath.  You have bleeding from your port that you cannot control. Summary  Take care of the port as told by your   health care provider. Keep the manufacturer's information card with you at all times.  Change your dressing as told by your health care provider.  Contact a health care provider if you have a fever or chills or if you have redness, swelling, or pain around your port insertion site.  Keep all follow-up visits as told by your health care provider. This information is not intended to replace advice given to you by your health care provider. Make sure you discuss any questions you have with your health care provider. Document Revised: 04/27/2018 Document Reviewed: 04/27/2018 Elsevier Patient Education   2021 Elsevier Inc.  

## 2021-01-31 ENCOUNTER — Inpatient Hospital Stay: Payer: 59

## 2021-01-31 ENCOUNTER — Other Ambulatory Visit: Payer: Self-pay

## 2021-01-31 VITALS — BP 131/91 | HR 73 | Resp 16

## 2021-01-31 DIAGNOSIS — Z5111 Encounter for antineoplastic chemotherapy: Secondary | ICD-10-CM | POA: Diagnosis not present

## 2021-01-31 DIAGNOSIS — C50412 Malignant neoplasm of upper-outer quadrant of left female breast: Secondary | ICD-10-CM

## 2021-01-31 MED ORDER — PEGFILGRASTIM-BMEZ 6 MG/0.6ML ~~LOC~~ SOSY
6.0000 mg | PREFILLED_SYRINGE | Freq: Once | SUBCUTANEOUS | Status: AC
  Administered 2021-01-31: 6 mg via SUBCUTANEOUS

## 2021-01-31 MED ORDER — PEGFILGRASTIM-BMEZ 6 MG/0.6ML ~~LOC~~ SOSY
PREFILLED_SYRINGE | SUBCUTANEOUS | Status: AC
  Filled 2021-01-31: qty 0.6

## 2021-01-31 NOTE — Patient Instructions (Signed)
Pegfilgrastim injection What is this medicine? PEGFILGRASTIM (PEG fil gra stim) is a long-acting granulocyte colony-stimulating factor that stimulates the growth of neutrophils, a type of white blood cell important in the body's fight against infection. It is used to reduce the incidence of fever and infection in patients with certain types of cancer who are receiving chemotherapy that affects the bone marrow, and to increase survival after being exposed to high doses of radiation. This medicine may be used for other purposes; ask your health care provider or pharmacist if you have questions. COMMON BRAND NAME(S): Fulphila, Neulasta, Nyvepria, UDENYCA, Ziextenzo What should I tell my health care provider before I take this medicine? They need to know if you have any of these conditions:  kidney disease  latex allergy  ongoing radiation therapy  sickle cell disease  skin reactions to acrylic adhesives (On-Body Injector only)  an unusual or allergic reaction to pegfilgrastim, filgrastim, other medicines, foods, dyes, or preservatives  pregnant or trying to get pregnant  breast-feeding How should I use this medicine? This medicine is for injection under the skin. If you get this medicine at home, you will be taught how to prepare and give the pre-filled syringe or how to use the On-body Injector. Refer to the patient Instructions for Use for detailed instructions. Use exactly as directed. Tell your healthcare provider immediately if you suspect that the On-body Injector may not have performed as intended or if you suspect the use of the On-body Injector resulted in a missed or partial dose. It is important that you put your used needles and syringes in a special sharps container. Do not put them in a trash can. If you do not have a sharps container, call your pharmacist or healthcare provider to get one. Talk to your pediatrician regarding the use of this medicine in children. While this drug  may be prescribed for selected conditions, precautions do apply. Overdosage: If you think you have taken too much of this medicine contact a poison control center or emergency room at once. NOTE: This medicine is only for you. Do not share this medicine with others. What if I miss a dose? It is important not to miss your dose. Call your doctor or health care professional if you miss your dose. If you miss a dose due to an On-body Injector failure or leakage, a new dose should be administered as soon as possible using a single prefilled syringe for manual use. What may interact with this medicine? Interactions have not been studied. This list may not describe all possible interactions. Give your health care provider a list of all the medicines, herbs, non-prescription drugs, or dietary supplements you use. Also tell them if you smoke, drink alcohol, or use illegal drugs. Some items may interact with your medicine. What should I watch for while using this medicine? Your condition will be monitored carefully while you are receiving this medicine. You may need blood work done while you are taking this medicine. Talk to your health care provider about your risk of cancer. You may be more at risk for certain types of cancer if you take this medicine. If you are going to need a MRI, CT scan, or other procedure, tell your doctor that you are using this medicine (On-Body Injector only). What side effects may I notice from receiving this medicine? Side effects that you should report to your doctor or health care professional as soon as possible:  allergic reactions (skin rash, itching or hives, swelling of   the face, lips, or tongue)  back pain  dizziness  fever  pain, redness, or irritation at site where injected  pinpoint red spots on the skin  red or dark-brown urine  shortness of breath or breathing problems  stomach or side pain, or pain at the shoulder  swelling  tiredness  trouble  passing urine or change in the amount of urine  unusual bruising or bleeding Side effects that usually do not require medical attention (report to your doctor or health care professional if they continue or are bothersome):  bone pain  muscle pain This list may not describe all possible side effects. Call your doctor for medical advice about side effects. You may report side effects to FDA at 1-800-FDA-1088. Where should I keep my medicine? Keep out of the reach of children. If you are using this medicine at home, you will be instructed on how to store it. Throw away any unused medicine after the expiration date on the label. NOTE: This sheet is a summary. It may not cover all possible information. If you have questions about this medicine, talk to your doctor, pharmacist, or health care provider.  2021 Elsevier/Gold Standard (2019-10-21 13:20:51)  

## 2021-02-18 NOTE — Progress Notes (Signed)
Patient Care Team: London Pepper, MD as PCP - General (Family Medicine) Mauro Kaufmann, RN as Oncology Nurse Navigator Rockwell Germany, RN as Oncology Nurse Navigator Rolm Bookbinder, MD as Consulting Physician (General Surgery) Nicholas Lose, MD as Consulting Physician (Hematology and Oncology) Eppie Gibson, MD as Attending Physician (Radiation Oncology)  DIAGNOSIS:    ICD-10-CM   1. Malignant neoplasm of upper-outer quadrant of left breast in female, estrogen receptor negative ([place])  C50.412    Z17.1     SUMMARY OF ONCOLOGIC HISTORY: Oncology History  Malignant neoplasm of upper-outer quadrant of left breast in female, estrogen receptor negative ([place])  12/19/2020 Initial Diagnosis   Patient palpated a left breast mass and noted left axilla tenderness for one week. Diagnostic mammogram and US showed a conglomeration of masses in the left breast at the 2 o'clock position 3-4cm from the nipple spanning 3.9cm total, with a 0.7cm mass at the 2 o'clock position 6cm from the nipple, and two abnormal axillary lymph nodes. 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   12/26/2020 Cancer Staging   Staging form: Breast, AJCC 8th Edition - Clinical stage from 12/26/2020: Stage IIIA (cT3, cN1, cM0, G3, ER-, PR-, HER2+) - Signed by Nicholas Lose, MD on 12/26/2020 Stage prefix: Initial diagnosis Histologic grading system: 3 grade system   01/08/2021 -  Chemotherapy    Patient is on Treatment Plan: BREAST  DOCETAXEL + CARBOPLATIN + TRASTUZUMAB + PERTUZUMAB  (TCHP) Q21D       01/18/2021 Genetic Testing   Negative genetic testing:  No pathogenic variants detected on the [employer] CancerNext-Expanded + RNAinsight panel. The report date is 01/18/2021.   The CancerNext-Expanded + RNAinsight gene panel offered by [employer] and includes sequencing and rearrangement analysis for the following 77 genes: AIP, ALK, APC, ATM, AXIN2, BAP1,  BARD1, BLM, BMPR1A, BRCA1, BRCA2, BRIP1, CDC73, CDH1, CDK4, CDKN1B, CDKN2A, CHEK2, CTNNA1, DICER1, FANCC, FH, FLCN, GALNT12, KIF1B, LZTR1, MAX, MEN1, MET, MLH1, MSH2, MSH3, MSH6, MUTYH, NBN, NF1, NF2, NTHL1, PALB2, PHOX2B, PMS2, POT1, PRKAR1A, PTCH1, PTEN, RAD51C, RAD51D, RB1, RECQL, RET, SDHA, SDHAF2, SDHB, SDHC, SDHD, SMAD4, SMARCA4, SMARCB1, SMARCE1, STK11, SUFU, TMEM127, TP53, TSC1, TSC2, VHL and XRCC2 (sequencing and deletion/duplication); EGFR, EGLN1, HOXB13, KIT, MITF, PDGFRA, POLD1 and POLE (sequencing only); EPCAM and GREM1 (deletion/duplication only). RNA data is routinely analyzed for use in variant interpretation for all genes.     CHIEF COMPLIANT: Cycle 3 Day 1 TCH Perjeta  INTERVAL HISTORY: Maria Kennedy is a 50 y.o. with above-mentioned history of left breast cancer currently on neoadjuvant chemotherapy with TCH Perjeta.She presents to the clinic today for a toxicity check and cycle 3.   Her major complaint today is of profound fatigue that lasted the entire 2 and half weeks.  Only this past few days she has been feeling a lot better.  She has no problems with appetite.  Some things do not taste the same.  Denies any neuropathy.  Nails are darkening.  Denied any nausea or vomiting.  She did have intermittent diarrhea which got better with Imodium.  ALLERGIES:  is allergic to latex.  MEDICATIONS:  Current Outpatient Medications  Medication Sig Dispense Refill  . acetaminophen (TYLENOL) 650 MG CR tablet Take 650 mg by mouth every 8 (eight) hours as needed for pain.    Marland Kitchen albuterol (VENTOLIN HFA) 108 (90 Base) MCG/ACT inhaler Inhale 2 puffs into the lungs every 6 (six) hours as needed for wheezing or shortness  of breath.    . amphetamine-dextroamphetamine (ADDERALL XR) 20 MG 24 hr capsule Take 20 mg by mouth daily as needed.    Marland Kitchen dexamethasone (DECADRON) 4 MG tablet Take 1 tablet (4 mg total) by mouth daily. Take 1 tablet day before chemo and 1 tablet day after chemo with food 12 tablet  0  . lidocaine-prilocaine (EMLA) cream Apply to affected area once 30 g 3  . LORazepam (ATIVAN) 0.5 MG tablet Take 0.5 mg by mouth at bedtime.    . Menaquinone-7 (VITAMIN K2) 40 MCG TABS Take 2 tablets by mouth daily. 80 mcg daily total    . ondansetron (ZOFRAN) 8 MG tablet Take 1 tablet (8 mg total) by mouth 2 (two) times daily as needed (Nausea or vomiting). Start on the third day after chemotherapy. 30 tablet 1  . prochlorperazine (COMPAZINE) 10 MG tablet Take 1 tablet (10 mg total) by mouth every 6 (six) hours as needed (Nausea or vomiting). 30 tablet 1  . traMADol (ULTRAM) 50 MG tablet Take 1 tablet (50 mg total) by mouth every 6 (six) hours as needed. 6 tablet 0  . VITAMIN D PO Take 5,000 Int'l Units by mouth every other day.     No current facility-administered medications for this visit.    PHYSICAL EXAMINATION: ECOG PERFORMANCE STATUS: 1 - Symptomatic but completely ambulatory  There were no vitals filed for this visit. There were no vitals filed for this visit.  LABORATORY DATA:  I have reviewed the data as listed CMP Latest Ref Rng & Units 01/29/2021 01/15/2021 12/26/2020  Glucose 70 - 99 mg/dL 144(H) 104(H) 119(H)  BUN 6 - 20 mg/dL 14 13 9   Creatinine 0.44 - 1.00 mg/dL 0.85 0.81 0.90  Sodium 135 - 145 mmol/L 140 138 139  Potassium 3.5 - 5.1 mmol/L 4.4 4.6 3.8  Chloride 98 - 111 mmol/L 108 104 105  CO2 22 - 32 mmol/L 23 22 26   Calcium 8.9 - 10.3 mg/dL 9.1 8.5(L) 9.3  Total Protein 6.5 - 8.1 g/dL 7.0 7.1 7.6  Total Bilirubin 0.3 - 1.2 mg/dL <0.2(L) <0.2(L) 0.4  Alkaline Phos 38 - 126 U/L 70 55 50  AST 15 - 41 U/L 16 21 19   ALT 0 - 44 U/L 44 24 26    Lab Results  Component Value Date   WBC 9.4 01/29/2021   HGB 10.2 (L) 01/29/2021   HCT 33.1 (L) 01/29/2021   MCV 89.2 01/29/2021   PLT 537 (H) 01/29/2021   NEUTROABS 8.5 (H) 01/29/2021    ASSESSMENT & PLAN:  Malignant neoplasm of upper-outer quadrant of left breast in female, estrogen receptor negative ([place]) 12/19/2020:  Palpable left breast mass and left axillary tenderness. Mammogram revealed conglomeration of masses 3.9 cm and an adjacent 0.7 cm mass and 2 axillary lymph nodes that were abnormal. Biopsy revealed IDC with DCIS, grade 3, ER/PR negative, HER-2 +3+ by [facility]  12/24/2020: Breast MRI revealed mass or non-mass enhancement measuring 9.5 cm and 3 abnormal lymph nodes 01-02-21: Bone scan: No bone mets 01/01/2021: CT CAP: No distant metastatic disease  Treatment Plan: 1. Neoadjuvant chemotherapy with TCH Perjeta 6 cycles followed by Herceptin Perjeta maintenance versus Kadcyla maintenance (based on response to neoadjuvant chemo) for 1 year 2. Followed by breast conserving surgeryand targeted lymph node surgery 3. Followed by adjuvant radiation therapy -------------------------------------------------------------------------------------------------------------------------------- Current Treatment: Cycle 3 TCHP ECHO 01/02/21: EF 60-65%   Chemo toxicities: 1.  Severe bone pain related to Neulasta: Improved 2. diarrhea: Intermittently: Takes Imodium on and  off 3.    Fatigue 4.  Chemo induced anemia: Monitoring closely. Mild dizziness intermittently  Return to clinic in 3 weeks for cycle 3    No orders of the defined types were placed in this encounter.  The patient has a good understanding of the overall plan. she agrees with it. she will call with any problems that may develop before the next visit here.  Total time spent: 30 mins including face to face time and time spent for planning, charting and coordination of care  Rulon Eisenmenger, MD, MPH 02/19/2021  I, Molly Dorshimer, am acting as scribe for Dr. Nicholas Lose.  I have reviewed the above documentation for accuracy and completeness, and I agree with the above.

## 2021-02-19 ENCOUNTER — Inpatient Hospital Stay (HOSPITAL_BASED_OUTPATIENT_CLINIC_OR_DEPARTMENT_OTHER): Payer: 59 | Admitting: Hematology and Oncology

## 2021-02-19 ENCOUNTER — Inpatient Hospital Stay: Payer: 59

## 2021-02-19 ENCOUNTER — Other Ambulatory Visit: Payer: Self-pay

## 2021-02-19 ENCOUNTER — Other Ambulatory Visit: Payer: 59

## 2021-02-19 ENCOUNTER — Encounter: Payer: Self-pay | Admitting: *Deleted

## 2021-02-19 ENCOUNTER — Inpatient Hospital Stay: Payer: 59 | Attending: Hematology and Oncology

## 2021-02-19 DIAGNOSIS — Z171 Estrogen receptor negative status [ER-]: Secondary | ICD-10-CM

## 2021-02-19 DIAGNOSIS — Z5189 Encounter for other specified aftercare: Secondary | ICD-10-CM | POA: Diagnosis not present

## 2021-02-19 DIAGNOSIS — C50412 Malignant neoplasm of upper-outer quadrant of left female breast: Secondary | ICD-10-CM | POA: Diagnosis present

## 2021-02-19 DIAGNOSIS — Z5111 Encounter for antineoplastic chemotherapy: Secondary | ICD-10-CM | POA: Insufficient documentation

## 2021-02-19 DIAGNOSIS — Z95828 Presence of other vascular implants and grafts: Secondary | ICD-10-CM

## 2021-02-19 DIAGNOSIS — Z5112 Encounter for antineoplastic immunotherapy: Secondary | ICD-10-CM | POA: Insufficient documentation

## 2021-02-19 DIAGNOSIS — Z79899 Other long term (current) drug therapy: Secondary | ICD-10-CM | POA: Insufficient documentation

## 2021-02-19 LAB — CMP (CANCER CENTER ONLY)
ALT: 100 U/L — ABNORMAL HIGH (ref 0–44)
AST: 61 U/L — ABNORMAL HIGH (ref 15–41)
Albumin: 4 g/dL (ref 3.5–5.0)
Alkaline Phosphatase: 80 U/L (ref 38–126)
Anion gap: 7 (ref 5–15)
BUN: 14 mg/dL (ref 6–20)
CO2: 25 mmol/L (ref 22–32)
Calcium: 9 mg/dL (ref 8.9–10.3)
Chloride: 109 mmol/L (ref 98–111)
Creatinine: 0.78 mg/dL (ref 0.44–1.00)
GFR, Estimated: >60 mL/min (ref >60)
Glucose, Bld: 99 mg/dL (ref 70–99)
Potassium: 4.2 mmol/L (ref 3.5–5.1)
Sodium: 141 mmol/L (ref 135–145)
Total Bilirubin: 0.2 mg/dL — ABNORMAL LOW (ref 0.3–1.2)
Total Protein: 6.7 g/dL (ref 6.5–8.1)

## 2021-02-19 LAB — CBC WITH DIFFERENTIAL (CANCER CENTER ONLY)
Abs Immature Granulocytes: 0.02 10*3/uL (ref 0.00–0.07)
Basophils Absolute: 0 10*3/uL (ref 0.0–0.1)
Basophils Relative: 0 %
Eosinophils Absolute: 0.1 10*3/uL (ref 0.0–0.5)
Eosinophils Relative: 1 %
HCT: 32.7 % — ABNORMAL LOW (ref 36.0–46.0)
Hemoglobin: 10.2 g/dL — ABNORMAL LOW (ref 12.0–15.0)
Immature Granulocytes: 0 %
Lymphocytes Relative: 30 %
Lymphs Abs: 1.9 10*3/uL (ref 0.7–4.0)
MCH: 28.1 pg (ref 26.0–34.0)
MCHC: 31.2 g/dL (ref 30.0–36.0)
MCV: 90.1 fL (ref 80.0–100.0)
Monocytes Absolute: 0.5 10*3/uL (ref 0.1–1.0)
Monocytes Relative: 8 %
Neutro Abs: 3.9 10*3/uL (ref 1.7–7.7)
Neutrophils Relative %: 61 %
Platelet Count: 356 10*3/uL (ref 150–400)
RBC: 3.63 MIL/uL — ABNORMAL LOW (ref 3.87–5.11)
RDW: 18.8 % — ABNORMAL HIGH (ref 11.5–15.5)
WBC Count: 6.5 10*3/uL (ref 4.0–10.5)
nRBC: 0 % (ref 0.0–0.2)

## 2021-02-19 MED ORDER — DIPHENHYDRAMINE HCL 25 MG PO CAPS
ORAL_CAPSULE | ORAL | Status: AC
  Filled 2021-02-19: qty 1

## 2021-02-19 MED ORDER — DIPHENHYDRAMINE HCL 25 MG PO CAPS
25.0000 mg | ORAL_CAPSULE | Freq: Once | ORAL | Status: AC
  Administered 2021-02-19: 25 mg via ORAL

## 2021-02-19 MED ORDER — SODIUM CHLORIDE 0.9% FLUSH
10.0000 mL | INTRAVENOUS | Status: DC | PRN
  Administered 2021-02-19: 10 mL
  Filled 2021-02-19: qty 10

## 2021-02-19 MED ORDER — TRASTUZUMAB-ANNS CHEMO 150 MG IV SOLR
450.0000 mg | Freq: Once | INTRAVENOUS | Status: AC
  Administered 2021-02-19: 450 mg via INTRAVENOUS
  Filled 2021-02-19: qty 21.43

## 2021-02-19 MED ORDER — SODIUM CHLORIDE 0.9 % IV SOLN
Freq: Once | INTRAVENOUS | Status: AC
  Filled 2021-02-19: qty 250

## 2021-02-19 MED ORDER — SODIUM CHLORIDE 0.9 % IV SOLN
420.0000 mg | Freq: Once | INTRAVENOUS | Status: AC
  Administered 2021-02-19: 420 mg via INTRAVENOUS
  Filled 2021-02-19: qty 14

## 2021-02-19 MED ORDER — SODIUM CHLORIDE 0.9% FLUSH
10.0000 mL | Freq: Once | INTRAVENOUS | Status: AC
Start: 2021-02-19 — End: 2021-02-19
  Administered 2021-02-19: 10 mL
  Filled 2021-02-19: qty 10

## 2021-02-19 MED ORDER — ACETAMINOPHEN 325 MG PO TABS
650.0000 mg | ORAL_TABLET | Freq: Once | ORAL | Status: AC
Start: 2021-02-19 — End: 2021-02-19
  Administered 2021-02-19: 650 mg via ORAL

## 2021-02-19 MED ORDER — SODIUM CHLORIDE 0.9 % IV SOLN
75.0000 mg/m2 | Freq: Once | INTRAVENOUS | Status: AC
  Administered 2021-02-19: 140 mg via INTRAVENOUS
  Filled 2021-02-19: qty 14

## 2021-02-19 MED ORDER — SODIUM CHLORIDE 0.9 % IV SOLN
150.0000 mg | Freq: Once | INTRAVENOUS | Status: AC
  Administered 2021-02-19: 150 mg via INTRAVENOUS
  Filled 2021-02-19: qty 150

## 2021-02-19 MED ORDER — SODIUM CHLORIDE 0.9 % IV SOLN
700.0000 mg | Freq: Once | INTRAVENOUS | Status: AC
  Administered 2021-02-19: 700 mg via INTRAVENOUS
  Filled 2021-02-19: qty 70

## 2021-02-19 MED ORDER — SODIUM CHLORIDE 0.9 % IV SOLN
10.0000 mg | Freq: Once | INTRAVENOUS | Status: AC
  Administered 2021-02-19: 10 mg via INTRAVENOUS
  Filled 2021-02-19: qty 10

## 2021-02-19 MED ORDER — PALONOSETRON HCL INJECTION 0.25 MG/5ML
INTRAVENOUS | Status: AC
  Filled 2021-02-19: qty 5

## 2021-02-19 MED ORDER — HEPARIN SOD (PORK) LOCK FLUSH 100 UNIT/ML IV SOLN
500.0000 [IU] | Freq: Once | INTRAVENOUS | Status: AC | PRN
  Administered 2021-02-19: 500 [IU]
  Filled 2021-02-19: qty 5

## 2021-02-19 MED ORDER — ACETAMINOPHEN 325 MG PO TABS
ORAL_TABLET | ORAL | Status: AC
  Filled 2021-02-19: qty 2

## 2021-02-19 MED ORDER — PALONOSETRON HCL INJECTION 0.25 MG/5ML
0.2500 mg | Freq: Once | INTRAVENOUS | Status: AC
  Administered 2021-02-19: 0.25 mg via INTRAVENOUS

## 2021-02-19 NOTE — Assessment & Plan Note (Signed)
12/19/2020: Palpable left breast mass and left axillary tenderness. Mammogram revealed conglomeration of masses 3.9 cm and an adjacent 0.7 cm mass and 2 axillary lymph nodes that were abnormal. Biopsy revealed IDC with DCIS, grade 3, ER/PR negative, HER-2 +3+ by [facility]  12/24/2020: Breast MRI revealed mass or non-mass enhancement measuring 9.5 cm and 3 abnormal lymph nodes 01-02-21: Bone scan: No bone mets 01/01/2021: CT CAP: No distant metastatic disease  Treatment Plan: 1. Neoadjuvant chemotherapy with TCH Perjeta 6 cycles followed by Herceptin Perjeta maintenance versus Kadcyla maintenance (based on response to neoadjuvant chemo) for 1 year 2. Followed by breast conserving surgeryand targeted lymph node surgery 3. Followed by adjuvant radiation therapy -------------------------------------------------------------------------------------------------------------------------------- Current Treatment: Cycle 3 TCHP ECHO 01/02/21: EF 60-65%   Chemo toxicities: 1.  Severe bone pain related to Neulasta: Subsided after 48 hours but it was quite severe 2. diarrhea: Intermittently: Patient does not want to take Imodium because she is worried about constipation. 3.  Decreased taste and appetite   Return to clinic in 3 weeks for cycle 3

## 2021-02-19 NOTE — Patient Instructions (Signed)
[facility] MEDICAL [facility]COLOGY   Discharge Instructions: Thank you for choosing [facility] to provide your oncology and hematology care.   If you have a lab appointment with the Cancer Center, please go directly to the Cancer Center and check in at the registration area.   Wear comfortable clothing and clothing appropriate for easy access to any Portacath or PICC line.   We strive to give you quality time with your provider. You may need to reschedule your appointment if you arrive late (15 or more minutes).  Arriving late affects you and other patients whose appointments are after yours.  Also, if you miss three or more appointments without notifying the office, you may be dismissed from the clinic at the provider's discretion.      For prescription refill requests, have your pharmacy contact our office and allow 72 hours for refills to be completed.    Today you received the following chemotherapy and/or immunotherapy agents: Trastuzumab (Herceptin), Pertuzumab (Perjeta), Docetaxel (Taxotere), and Carboplatin      To help prevent nausea and vomiting after your treatment, we encourage you to take your nausea medication as directed.  BELOW ARE SYMPTOMS THAT SHOULD BE REPORTED IMMEDIATELY: *FEVER GREATER THAN 100.4 F (38 C) OR HIGHER *CHILLS OR SWEATING *NAUSEA AND VOMITING THAT IS NOT CONTROLLED WITH YOUR NAUSEA MEDICATION *UNUSUAL SHORTNESS OF BREATH *UNUSUAL BRUISING OR BLEEDING *URINARY PROBLEMS (pain or burning when urinating, or frequent urination) *BOWEL PROBLEMS (unusual diarrhea, constipation, pain near the anus) TENDERNESS IN MOUTH AND THROAT WITH OR WITHOUT PRESENCE OF ULCERS (sore throat, sores in mouth, or a toothache) UNUSUAL RASH, SWELLING OR PAIN  UNUSUAL VAGINAL DISCHARGE OR ITCHING   Items with * indicate a potential emergency and should be followed up as soon as possible or go to the Emergency Department if any problems should occur.  Please  show the CHEMOTHERAPY ALERT CARD or IMMUNOTHERAPY ALERT CARD at check-in to the Emergency Department and triage nurse.  Should you have questions after your visit or need to cancel or reschedule your appointment, please contact [facility]  Dept: [phone]  and follow the prompts.  Office hours are 8:00 a.m. to 4:30 p.m. Monday - Friday. Please note that voicemails left after 4:00 p.m. may not be returned until the following business day.  We are closed weekends and major holidays. You have access to a nurse at all times for urgent questions. Please call the main number to the clinic Dept: [phone] and follow the prompts.   For any non-urgent questions, you may also contact your provider using MyChart. We now offer e-Visits for anyone 18 and older to request care online for non-urgent symptoms. For details visit [email or web address].   Also download the MyChart app! Go to the app store, search "MyChart", open the app, select [facility], and log in with your MyChart username and password.  Due to Covid, a mask is required upon entering the hospital/clinic. If you do not have a mask, one will be given to you upon arrival. For doctor visits, patients may have 1 support person aged 18 or older with them. For treatment visits, patients cannot have anyone with them due to current Covid guidelines and our immunocompromised population.   

## 2021-02-19 NOTE — Progress Notes (Signed)
Per Dr. Lindi Adie - okay to treat with elevated ALT.

## 2021-02-19 NOTE — Patient Instructions (Signed)
Implanted Port Insertion, Care After This sheet gives you information about how to care for yourself after your procedure. Your health care provider may also give you more specific instructions. If you have problems or questions, contact your health care provider. What can I expect after the procedure? After the procedure, it is common to have:  Discomfort at the port insertion site.  Bruising on the skin over the port. This should improve over 3-4 days. Follow these instructions at home: Port care  After your port is placed, you will get a manufacturer's information card. The card has information about your port. Keep this card with you at all times.  Take care of the port as told by your health care provider. Ask your health care provider if you or a family member can get training for taking care of the port at home. A home health care nurse may also take care of the port.  Make sure to remember what type of port you have. Incision care  Follow instructions from your health care provider about how to take care of your port insertion site. Make sure you: ? Wash your hands with soap and water before and after you change your bandage (dressing). If soap and water are not available, use hand sanitizer. ? Change your dressing as told by your health care provider. ? Leave stitches (sutures), skin glue, or adhesive strips in place. These skin closures may need to stay in place for 2 weeks or longer. If adhesive strip edges start to loosen and curl up, you may trim the loose edges. Do not remove adhesive strips completely unless your health care provider tells you to do that.  Check your port insertion site every day for signs of infection. Check for: ? Redness, swelling, or pain. ? Fluid or blood. ? Warmth. ? Pus or a bad smell.      Activity  Return to your normal activities as told by your health care provider. Ask your health care provider what activities are safe for you.  Do not  lift anything that is heavier than 10 lb (4.5 kg), or the limit that you are told, until your health care provider says that it is safe. General instructions  Take over-the-counter and prescription medicines only as told by your health care provider.  Do not take baths, swim, or use a hot tub until your health care provider approves. Ask your health care provider if you may take showers. You may only be allowed to take sponge baths.  Do not drive for 24 hours if you were given a sedative during your procedure.  Wear a medical alert bracelet in case of an emergency. This will tell any health care providers that you have a port.  Keep all follow-up visits as told by your health care provider. This is important. Contact a health care provider if:  You cannot flush your port with saline as directed, or you cannot draw blood from the port.  You have a fever or chills.  You have redness, swelling, or pain around your port insertion site.  You have fluid or blood coming from your port insertion site.  Your port insertion site feels warm to the touch.  You have pus or a bad smell coming from the port insertion site. Get help right away if:  You have chest pain or shortness of breath.  You have bleeding from your port that you cannot control. Summary  Take care of the port as told by your   health care provider. Keep the manufacturer's information card with you at all times.  Change your dressing as told by your health care provider.  Contact a health care provider if you have a fever or chills or if you have redness, swelling, or pain around your port insertion site.  Keep all follow-up visits as told by your health care provider. This information is not intended to replace advice given to you by your health care provider. Make sure you discuss any questions you have with your health care provider. Document Revised: 04/27/2018 Document Reviewed: 04/27/2018 Elsevier Patient Education   2021 Elsevier Inc.  

## 2021-02-20 ENCOUNTER — Telehealth: Payer: Self-pay | Admitting: Hematology and Oncology

## 2021-02-20 ENCOUNTER — Other Ambulatory Visit: Payer: Self-pay | Admitting: Hematology and Oncology

## 2021-02-20 MED ORDER — LORAZEPAM 0.5 MG PO TABS: 0.5000 mg | ORAL_TABLET | Freq: Every day | ORAL | 3 refills | Status: DC

## 2021-02-20 NOTE — Telephone Encounter (Signed)
Scheduled appts per 5/10 sch msg. Pt aware.  

## 2021-02-21 ENCOUNTER — Other Ambulatory Visit: Payer: Self-pay

## 2021-02-21 ENCOUNTER — Inpatient Hospital Stay: Payer: 59

## 2021-02-21 DIAGNOSIS — Z5111 Encounter for antineoplastic chemotherapy: Secondary | ICD-10-CM | POA: Diagnosis not present

## 2021-02-21 DIAGNOSIS — Z171 Estrogen receptor negative status [ER-]: Secondary | ICD-10-CM

## 2021-02-21 DIAGNOSIS — C50412 Malignant neoplasm of upper-outer quadrant of left female breast: Secondary | ICD-10-CM

## 2021-02-21 MED ORDER — PEGFILGRASTIM-BMEZ 6 MG/0.6ML ~~LOC~~ SOSY
PREFILLED_SYRINGE | SUBCUTANEOUS | Status: AC
  Filled 2021-02-21: qty 0.6

## 2021-02-21 MED ORDER — PEGFILGRASTIM-BMEZ 6 MG/0.6ML ~~LOC~~ SOSY
6.0000 mg | PREFILLED_SYRINGE | Freq: Once | SUBCUTANEOUS | Status: AC
  Administered 2021-02-21: 6 mg via SUBCUTANEOUS

## 2021-02-21 NOTE — Patient Instructions (Signed)
Pegfilgrastim injection What is this medicine? PEGFILGRASTIM (PEG fil gra stim) is a long-acting granulocyte colony-stimulating factor that stimulates the growth of neutrophils, a type of white blood cell important in the body's fight against infection. It is used to reduce the incidence of fever and infection in patients with certain types of cancer who are receiving chemotherapy that affects the bone marrow, and to increase survival after being exposed to high doses of radiation. This medicine may be used for other purposes; ask your health care provider or pharmacist if you have questions. COMMON BRAND NAME(S): Fulphila, Neulasta, Nyvepria, UDENYCA, Ziextenzo What should I tell my health care provider before I take this medicine? They need to know if you have any of these conditions:  kidney disease  latex allergy  ongoing radiation therapy  sickle cell disease  skin reactions to acrylic adhesives (On-Body Injector only)  an unusual or allergic reaction to pegfilgrastim, filgrastim, other medicines, foods, dyes, or preservatives  pregnant or trying to get pregnant  breast-feeding How should I use this medicine? This medicine is for injection under the skin. If you get this medicine at home, you will be taught how to prepare and give the pre-filled syringe or how to use the On-body Injector. Refer to the patient Instructions for Use for detailed instructions. Use exactly as directed. Tell your healthcare provider immediately if you suspect that the On-body Injector may not have performed as intended or if you suspect the use of the On-body Injector resulted in a missed or partial dose. It is important that you put your used needles and syringes in a special sharps container. Do not put them in a trash can. If you do not have a sharps container, call your pharmacist or healthcare provider to get one. Talk to your pediatrician regarding the use of this medicine in children. While this drug  may be prescribed for selected conditions, precautions do apply. Overdosage: If you think you have taken too much of this medicine contact a poison control center or emergency room at once. NOTE: This medicine is only for you. Do not share this medicine with others. What if I miss a dose? It is important not to miss your dose. Call your doctor or health care professional if you miss your dose. If you miss a dose due to an On-body Injector failure or leakage, a new dose should be administered as soon as possible using a single prefilled syringe for manual use. What may interact with this medicine? Interactions have not been studied. This list may not describe all possible interactions. Give your health care provider a list of all the medicines, herbs, non-prescription drugs, or dietary supplements you use. Also tell them if you smoke, drink alcohol, or use illegal drugs. Some items may interact with your medicine. What should I watch for while using this medicine? Your condition will be monitored carefully while you are receiving this medicine. You may need blood work done while you are taking this medicine. Talk to your health care provider about your risk of cancer. You may be more at risk for certain types of cancer if you take this medicine. If you are going to need a MRI, CT scan, or other procedure, tell your doctor that you are using this medicine (On-Body Injector only). What side effects may I notice from receiving this medicine? Side effects that you should report to your doctor or health care professional as soon as possible:  allergic reactions (skin rash, itching or hives, swelling of   the face, lips, or tongue)  back pain  dizziness  fever  pain, redness, or irritation at site where injected  pinpoint red spots on the skin  red or dark-brown urine  shortness of breath or breathing problems  stomach or side pain, or pain at the shoulder  swelling  tiredness  trouble  passing urine or change in the amount of urine  unusual bruising or bleeding Side effects that usually do not require medical attention (report to your doctor or health care professional if they continue or are bothersome):  bone pain  muscle pain This list may not describe all possible side effects. Call your doctor for medical advice about side effects. You may report side effects to FDA at 1-800-FDA-1088. Where should I keep my medicine? Keep out of the reach of children. If you are using this medicine at home, you will be instructed on how to store it. Throw away any unused medicine after the expiration date on the label. NOTE: This sheet is a summary. It may not cover all possible information. If you have questions about this medicine, talk to your doctor, pharmacist, or health care provider.  2021 Elsevier/Gold Standard (2019-10-21 13:20:51)  

## 2021-03-08 ENCOUNTER — Encounter: Payer: Self-pay | Admitting: *Deleted

## 2021-03-12 ENCOUNTER — Ambulatory Visit: Payer: 59 | Admitting: Hematology and Oncology

## 2021-03-12 ENCOUNTER — Other Ambulatory Visit: Payer: 59

## 2021-03-13 NOTE — Progress Notes (Signed)
Patient Care Team: London Pepper, MD as PCP - General (Family Medicine) Mauro Kaufmann, RN as Oncology Nurse Navigator Rockwell Germany, RN as Oncology Nurse Navigator Rolm Bookbinder, MD as Consulting Physician (General Surgery) Nicholas Lose, MD as Consulting Physician (Hematology and Oncology) Eppie Gibson, MD as Attending Physician (Radiation Oncology)  DIAGNOSIS:    ICD-10-CM   1. Malignant neoplasm of upper-outer quadrant of left breast in female, estrogen receptor negative ([place])  C50.412    Z17.1     SUMMARY OF ONCOLOGIC HISTORY: Oncology History  Malignant neoplasm of upper-outer quadrant of left breast in female, estrogen receptor negative ([place])  12/19/2020 Initial Diagnosis   Patient palpated a left breast mass and noted left axilla tenderness for one week. Diagnostic mammogram and US showed a conglomeration of masses in the left breast at the 2 o'clock position 3-4cm from the nipple spanning 3.9cm total, with a 0.7cm mass at the 2 o'clock position 6cm from the nipple, and two abnormal axillary lymph nodes. 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   12/26/2020 Cancer Staging   Staging form: Breast, AJCC 8th Edition - Clinical stage from 12/26/2020: Stage IIIA (cT3, cN1, cM0, G3, ER-, PR-, HER2+) - Signed by Nicholas Lose, MD on 12/26/2020 Stage prefix: Initial diagnosis Histologic grading system: 3 grade system   01/08/2021 -  Chemotherapy    Patient is on Treatment Plan: BREAST  DOCETAXEL + CARBOPLATIN + TRASTUZUMAB + PERTUZUMAB  (TCHP) Q21D       01/18/2021 Genetic Testing   Negative genetic testing:  No pathogenic variants detected on the [facility] CancerNext-Expanded + RNAinsight panel. The report date is 01/18/2021.   The CancerNext-Expanded + RNAinsight gene panel offered by [employer] and includes sequencing and rearrangement analysis for the following 77 genes: AIP, ALK, APC, ATM, AXIN2, BAP1,  BARD1, BLM, BMPR1A, BRCA1, BRCA2, BRIP1, CDC73, CDH1, CDK4, CDKN1B, CDKN2A, CHEK2, CTNNA1, DICER1, FANCC, FH, FLCN, GALNT12, KIF1B, LZTR1, MAX, MEN1, MET, MLH1, MSH2, MSH3, MSH6, MUTYH, NBN, NF1, NF2, NTHL1, PALB2, PHOX2B, PMS2, POT1, PRKAR1A, PTCH1, PTEN, RAD51C, RAD51D, RB1, RECQL, RET, SDHA, SDHAF2, SDHB, SDHC, SDHD, SMAD4, SMARCA4, SMARCB1, SMARCE1, STK11, SUFU, TMEM127, TP53, TSC1, TSC2, VHL and XRCC2 (sequencing and deletion/duplication); EGFR, EGLN1, HOXB13, KIT, MITF, PDGFRA, POLD1 and POLE (sequencing only); EPCAM and GREM1 (deletion/duplication only). RNA data is routinely analyzed for use in variant interpretation for all genes.     CHIEF COMPLIANT: Cycle 4 Day1TCH Perjeta  INTERVAL HISTORY: Maria Kennedy is a 50 y.o. with above-mentioned history of left breast cancer currently on neoadjuvant chemotherapy with TCH Perjeta.She presents to the clinic today fora toxicity check and cycle 4.   After last cycle of chemotherapy she had a couple of days of mild nausea and also 2 or 3 episodes of diarrhea for which she took Imodium.  She has not been drinking enough water.  She was gardening intensely and last couple of days she is having bilateral lower extremity pains for which she took Tylenol.  She also complains of feeling hot and cold all the time.  ALLERGIES:  is allergic to latex.  MEDICATIONS:  Current Outpatient Medications  Medication Sig Dispense Refill  . acetaminophen (TYLENOL) 650 MG CR tablet Take 650 mg by mouth every 8 (eight) hours as needed for pain.    Marland Kitchen albuterol (VENTOLIN HFA) 108 (90 Base) MCG/ACT inhaler Inhale 2 puffs into the lungs every 6 (six) hours as needed for wheezing or shortness of breath.    Marland Kitchen  amphetamine-dextroamphetamine (ADDERALL XR) 20 MG 24 hr capsule Take 20 mg by mouth daily as needed.    . dexamethasone (DECADRON) 4 MG tablet Take 1 tablet (4 mg total) by mouth daily. Take 1 tablet day before chemo and 1 tablet day after chemo with food 12 tablet 0   . lidocaine-prilocaine (EMLA) cream Apply to affected area once 30 g 3  . LORazepam (ATIVAN) 0.5 MG tablet Take 1 tablet (0.5 mg total) by mouth at bedtime. 30 tablet 3  . Menaquinone-7 (VITAMIN K2) 40 MCG TABS Take 2 tablets by mouth daily. 80 mcg daily total    . ondansetron (ZOFRAN) 8 MG tablet Take 1 tablet (8 mg total) by mouth 2 (two) times daily as needed (Nausea or vomiting). Start on the third day after chemotherapy. 30 tablet 1  . prochlorperazine (COMPAZINE) 10 MG tablet Take 1 tablet (10 mg total) by mouth every 6 (six) hours as needed (Nausea or vomiting). 30 tablet 1  . traMADol (ULTRAM) 50 MG tablet Take 1 tablet (50 mg total) by mouth every 6 (six) hours as needed. 6 tablet 0  . VITAMIN D PO Take 5,000 Int'l Units by mouth every other day.     No current facility-administered medications for this visit.    PHYSICAL EXAMINATION: ECOG PERFORMANCE STATUS: 1 - Symptomatic but completely ambulatory  Vitals:   03/14/21 0828  BP: 119/60  Pulse: 85  Resp: 18  Temp: 97.6 F (36.4 C)  SpO2: 100%   Filed Weights   03/14/21 0828  Weight: 174 lb 1.6 oz (79 kg)    LABORATORY DATA:  I have reviewed the data as listed CMP Latest Ref Rng & Units 02/19/2021 01/29/2021 01/15/2021  Glucose 70 - 99 mg/dL 99 144(H) 104(H)  BUN 6 - 20 mg/dL 14 14 13  Creatinine 0.44 - 1.00 mg/dL 0.78 0.85 0.81  Sodium 135 - 145 mmol/L 141 140 138  Potassium 3.5 - 5.1 mmol/L 4.2 4.4 4.6  Chloride 98 - 111 mmol/L 109 108 104  CO2 22 - 32 mmol/L 25 23 22  Calcium 8.9 - 10.3 mg/dL 9.0 9.1 8.5(L)  Total Protein 6.5 - 8.1 g/dL 6.7 7.0 7.1  Total Bilirubin 0.3 - 1.2 mg/dL 0.2(L) <0.2(L) <0.2(L)  Alkaline Phos 38 - 126 U/L 80 70 55  AST 15 - 41 U/L 61(H) 16 21  ALT 0 - 44 U/L 100(H) 44 24    Lab Results  Component Value Date   WBC 5.6 03/14/2021   HGB 10.2 (L) 03/14/2021   HCT 31.8 (L) 03/14/2021   MCV 90.3 03/14/2021   PLT 321 03/14/2021   NEUTROABS 2.6 03/14/2021    ASSESSMENT & PLAN:   Malignant neoplasm of upper-outer quadrant of left breast in female, estrogen receptor negative ([facility]) 12/19/2020: Palpable left breast mass and left axillary tenderness. Mammogram revealed conglomeration of masses 3.9 cm and an adjacent 0.7 cm mass and 2 axillary lymph nodes that were abnormal. Biopsy revealed IDC with DCIS, grade 3, ER/PR negative, HER-2 +3+ by [facility]  12/24/2020: Breast MRI revealed mass or non-mass enhancement measuring 9.5 cm and 3 abnormal lymph nodes 01-02-21: Bone scan: No bone mets 01/01/2021: CT CAP: No distant metastatic disease  Treatment Plan: 1. Neoadjuvant chemotherapy with TCH Perjeta 6 cycles followed by Herceptin Perjeta maintenance versus Kadcyla maintenance (based on response to neoadjuvant chemo) for 1 year 2. Followed by breast conserving surgeryand targeted lymph node surgery 3. Followed by adjuvant radiation therapy -------------------------------------------------------------------------------------------------------------------------------- Current Treatment: Cycle 4TCHP ECHO 01/02/21: EF 60-65%    Chemo toxicities: 1.Severe bone pain related to Neulasta: Improved 2.diarrhea: Intermittently: Takes Imodium on and off 3.  Fatigue: She is having difficulty with walking long distances or climbing up stairs because of fatigue.  It is likely chemo related.  Her hemoglobin today appears to be stable at 10.2. 4.  Chemo induced anemia: Monitoring closely. 5.  Mild nausea: Improved with antinausea medication 6.  Bilateral lower extremity pains in the thighs and calves: Probably because of heavy gardening that she performed. 7.  Elevated LFTs: Waiting for today's blood work to come back. Her chemo was set up for tomorrow. Monitoring closely for neuropathy.  Return to clinic in 3 weeks for cycle 5    No orders of the defined types were placed in this encounter.  The patient has a good understanding of the overall plan. she agrees with it. she will  call with any problems that may develop before the next visit here.  Total time spent: 30 mins including face to face time and time spent for planning, charting and coordination of care  Rulon Eisenmenger, MD, MPH 03/14/2021  I, Cloyde Reams Dorshimer, am acting as scribe for Dr. Nicholas Lose.  I have reviewed the above documentation for accuracy and completeness, and I agree with the above.

## 2021-03-13 NOTE — Assessment & Plan Note (Signed)
12/19/2020: Palpable left breast mass and left axillary tenderness. Mammogram revealed conglomeration of masses 3.9 cm and an adjacent 0.7 cm mass and 2 axillary lymph nodes that were abnormal. Biopsy revealed IDC with DCIS, grade 3, ER/PR negative, HER-2 +3+ by [facility]  12/24/2020: Breast MRI revealed mass or non-mass enhancement measuring 9.5 cm and 3 abnormal lymph nodes 01-02-21: Bone scan: No bone mets 01/01/2021: CT CAP: No distant metastatic disease  Treatment Plan: 1. Neoadjuvant chemotherapy with TCH Perjeta 6 cycles followed by Herceptin Perjeta maintenance versus Kadcyla maintenance (based on response to neoadjuvant chemo) for 1 year 2. Followed by breast conserving surgeryand targeted lymph node surgery 3. Followed by adjuvant radiation therapy -------------------------------------------------------------------------------------------------------------------------------- Current Treatment: Cycle 4TCHP ECHO 01/02/21: EF 60-65%  Chemo toxicities: 1.Severe bone pain related to Neulasta: Improved 2.diarrhea: Intermittently: Takes Imodium on and off 3.  Fatigue 4.  Chemo induced anemia: Monitoring closely. Mild dizziness intermittently  Return to clinic in 3 weeks for cycle 5

## 2021-03-14 ENCOUNTER — Inpatient Hospital Stay: Payer: 59 | Attending: Hematology and Oncology

## 2021-03-14 ENCOUNTER — Inpatient Hospital Stay (HOSPITAL_BASED_OUTPATIENT_CLINIC_OR_DEPARTMENT_OTHER): Payer: 59 | Admitting: Hematology and Oncology

## 2021-03-14 ENCOUNTER — Other Ambulatory Visit: Payer: Self-pay

## 2021-03-14 DIAGNOSIS — R7989 Other specified abnormal findings of blood chemistry: Secondary | ICD-10-CM | POA: Insufficient documentation

## 2021-03-14 DIAGNOSIS — D630 Anemia in neoplastic disease: Secondary | ICD-10-CM | POA: Diagnosis not present

## 2021-03-14 DIAGNOSIS — Z5112 Encounter for antineoplastic immunotherapy: Secondary | ICD-10-CM | POA: Diagnosis present

## 2021-03-14 DIAGNOSIS — Z79899 Other long term (current) drug therapy: Secondary | ICD-10-CM | POA: Insufficient documentation

## 2021-03-14 DIAGNOSIS — Z171 Estrogen receptor negative status [ER-]: Secondary | ICD-10-CM

## 2021-03-14 DIAGNOSIS — Z95828 Presence of other vascular implants and grafts: Secondary | ICD-10-CM

## 2021-03-14 DIAGNOSIS — M79605 Pain in left leg: Secondary | ICD-10-CM | POA: Diagnosis not present

## 2021-03-14 DIAGNOSIS — T451X5A Adverse effect of antineoplastic and immunosuppressive drugs, initial encounter: Secondary | ICD-10-CM | POA: Diagnosis not present

## 2021-03-14 DIAGNOSIS — R110 Nausea: Secondary | ICD-10-CM | POA: Diagnosis not present

## 2021-03-14 DIAGNOSIS — R5383 Other fatigue: Secondary | ICD-10-CM | POA: Diagnosis not present

## 2021-03-14 DIAGNOSIS — C50412 Malignant neoplasm of upper-outer quadrant of left female breast: Secondary | ICD-10-CM

## 2021-03-14 DIAGNOSIS — Z5189 Encounter for other specified aftercare: Secondary | ICD-10-CM | POA: Diagnosis not present

## 2021-03-14 DIAGNOSIS — G629 Polyneuropathy, unspecified: Secondary | ICD-10-CM | POA: Insufficient documentation

## 2021-03-14 DIAGNOSIS — M79604 Pain in right leg: Secondary | ICD-10-CM | POA: Diagnosis not present

## 2021-03-14 DIAGNOSIS — Z5111 Encounter for antineoplastic chemotherapy: Secondary | ICD-10-CM | POA: Diagnosis present

## 2021-03-14 LAB — CBC WITH DIFFERENTIAL (CANCER CENTER ONLY)
Abs Immature Granulocytes: 0.01 10*3/uL (ref 0.00–0.07)
Basophils Absolute: 0 10*3/uL (ref 0.0–0.1)
Basophils Relative: 0 %
Eosinophils Absolute: 0.1 10*3/uL (ref 0.0–0.5)
Eosinophils Relative: 1 %
HCT: 31.8 % — ABNORMAL LOW (ref 36.0–46.0)
Hemoglobin: 10.2 g/dL — ABNORMAL LOW (ref 12.0–15.0)
Immature Granulocytes: 0 %
Lymphocytes Relative: 42 %
Lymphs Abs: 2.3 10*3/uL (ref 0.7–4.0)
MCH: 29 pg (ref 26.0–34.0)
MCHC: 32.1 g/dL (ref 30.0–36.0)
MCV: 90.3 fL (ref 80.0–100.0)
Monocytes Absolute: 0.5 10*3/uL (ref 0.1–1.0)
Monocytes Relative: 9 %
Neutro Abs: 2.6 10*3/uL (ref 1.7–7.7)
Neutrophils Relative %: 48 %
Platelet Count: 321 10*3/uL (ref 150–400)
RBC: 3.52 MIL/uL — ABNORMAL LOW (ref 3.87–5.11)
RDW: 20.1 % — ABNORMAL HIGH (ref 11.5–15.5)
WBC Count: 5.6 10*3/uL (ref 4.0–10.5)
nRBC: 0 % (ref 0.0–0.2)

## 2021-03-14 LAB — CMP (CANCER CENTER ONLY)
ALT: 69 U/L — ABNORMAL HIGH (ref 0–44)
AST: 49 U/L — ABNORMAL HIGH (ref 15–41)
Albumin: 3.8 g/dL (ref 3.5–5.0)
Alkaline Phosphatase: 69 U/L (ref 38–126)
Anion gap: 11 (ref 5–15)
BUN: 17 mg/dL (ref 6–20)
CO2: 22 mmol/L (ref 22–32)
Calcium: 9.1 mg/dL (ref 8.9–10.3)
Chloride: 108 mmol/L (ref 98–111)
Creatinine: 0.77 mg/dL (ref 0.44–1.00)
GFR, Estimated: >60 mL/min (ref >60)
Glucose, Bld: 108 mg/dL — ABNORMAL HIGH (ref 70–99)
Potassium: 4.4 mmol/L (ref 3.5–5.1)
Sodium: 141 mmol/L (ref 135–145)
Total Bilirubin: 0.2 mg/dL — ABNORMAL LOW (ref 0.3–1.2)
Total Protein: 6.6 g/dL (ref 6.5–8.1)

## 2021-03-14 MED ORDER — SODIUM CHLORIDE 0.9% FLUSH
10.0000 mL | Freq: Once | INTRAVENOUS | Status: AC
  Administered 2021-03-14: 10 mL
  Filled 2021-03-14: qty 10

## 2021-03-14 MED ORDER — HEPARIN SOD (PORK) LOCK FLUSH 100 UNIT/ML IV SOLN
500.0000 [IU] | Freq: Once | INTRAVENOUS | Status: AC
  Administered 2021-03-14: 500 [IU]
  Filled 2021-03-14: qty 5

## 2021-03-14 NOTE — Patient Instructions (Signed)

## 2021-03-15 ENCOUNTER — Inpatient Hospital Stay: Payer: 59

## 2021-03-15 ENCOUNTER — Other Ambulatory Visit: Payer: Self-pay | Admitting: Hematology and Oncology

## 2021-03-15 VITALS — BP 119/83 | HR 95 | Temp 98.2°F | Resp 18

## 2021-03-15 DIAGNOSIS — Z5111 Encounter for antineoplastic chemotherapy: Secondary | ICD-10-CM | POA: Diagnosis not present

## 2021-03-15 DIAGNOSIS — C50412 Malignant neoplasm of upper-outer quadrant of left female breast: Secondary | ICD-10-CM

## 2021-03-15 DIAGNOSIS — Z171 Estrogen receptor negative status [ER-]: Secondary | ICD-10-CM

## 2021-03-15 MED ORDER — SODIUM CHLORIDE 0.9 % IV SOLN
10.0000 mg | Freq: Once | INTRAVENOUS | Status: AC
  Administered 2021-03-15: 10 mg via INTRAVENOUS
  Filled 2021-03-15: qty 10

## 2021-03-15 MED ORDER — FOSAPREPITANT DIMEGLUMINE INJECTION 150 MG
150.0000 mg | Freq: Once | INTRAVENOUS | Status: AC
  Administered 2021-03-15: 150 mg via INTRAVENOUS
  Filled 2021-03-15: qty 150

## 2021-03-15 MED ORDER — PALONOSETRON HCL INJECTION 0.25 MG/5ML
INTRAVENOUS | Status: AC
  Filled 2021-03-15: qty 5

## 2021-03-15 MED ORDER — SODIUM CHLORIDE 0.9 % IV SOLN
Freq: Once | INTRAVENOUS | Status: AC
  Filled 2021-03-15: qty 250

## 2021-03-15 MED ORDER — ACETAMINOPHEN 325 MG PO TABS
ORAL_TABLET | ORAL | Status: AC
  Filled 2021-03-15: qty 2

## 2021-03-15 MED ORDER — SODIUM CHLORIDE 0.9% FLUSH
10.0000 mL | INTRAVENOUS | Status: DC | PRN
  Administered 2021-03-15: 10 mL
  Filled 2021-03-15: qty 10

## 2021-03-15 MED ORDER — DIPHENHYDRAMINE HCL 25 MG PO CAPS
ORAL_CAPSULE | ORAL | Status: AC
  Filled 2021-03-15: qty 1

## 2021-03-15 MED ORDER — SODIUM CHLORIDE 0.9 % IV SOLN
420.0000 mg | Freq: Once | INTRAVENOUS | Status: AC
  Administered 2021-03-15: 420 mg via INTRAVENOUS
  Filled 2021-03-15: qty 14

## 2021-03-15 MED ORDER — ACETAMINOPHEN 325 MG PO TABS
650.0000 mg | ORAL_TABLET | Freq: Once | ORAL | Status: AC
  Administered 2021-03-15: 650 mg via ORAL

## 2021-03-15 MED ORDER — DIPHENHYDRAMINE HCL 25 MG PO CAPS
25.0000 mg | ORAL_CAPSULE | Freq: Once | ORAL | Status: AC
  Administered 2021-03-15: 25 mg via ORAL

## 2021-03-15 MED ORDER — PALONOSETRON HCL INJECTION 0.25 MG/5ML
0.2500 mg | Freq: Once | INTRAVENOUS | Status: AC
  Administered 2021-03-15: 0.25 mg via INTRAVENOUS

## 2021-03-15 MED ORDER — TRASTUZUMAB-ANNS CHEMO 150 MG IV SOLR
450.0000 mg | Freq: Once | INTRAVENOUS | Status: AC
  Administered 2021-03-15: 450 mg via INTRAVENOUS
  Filled 2021-03-15: qty 21.43

## 2021-03-15 MED ORDER — SODIUM CHLORIDE 0.9 % IV SOLN
700.0000 mg | Freq: Once | INTRAVENOUS | Status: AC
  Administered 2021-03-15: 700 mg via INTRAVENOUS
  Filled 2021-03-15: qty 70

## 2021-03-15 MED ORDER — HEPARIN SOD (PORK) LOCK FLUSH 100 UNIT/ML IV SOLN
500.0000 [IU] | Freq: Once | INTRAVENOUS | Status: AC | PRN
  Administered 2021-03-15: 500 [IU]
  Filled 2021-03-15: qty 5

## 2021-03-15 MED ORDER — SODIUM CHLORIDE 0.9 % IV SOLN
75.0000 mg/m2 | Freq: Once | INTRAVENOUS | Status: AC
  Administered 2021-03-15: 140 mg via INTRAVENOUS
  Filled 2021-03-15: qty 14

## 2021-03-15 NOTE — Patient Instructions (Signed)
[facility] ONCOLOGY  Discharge Instructions: Thank you for choosing [facility] to provide your oncology and hematology care.   If you have a lab appointment with the [facility], please go directly to the [facility] and check in at the registration area.   Wear comfortable clothing and clothing appropriate for easy access to any Portacath or PICC line.   We strive to give you quality time with your provider. You may need to reschedule your appointment if you arrive late (15 or more minutes).  Arriving late affects you and other patients whose appointments are after yours.  Also, if you miss three or more appointments without notifying the office, you may be dismissed from the clinic at the provider's discretion.      For prescription refill requests, have your pharmacy contact our office and allow 72 hours for refills to be completed.    Today you received the following chemotherapy and/or immunotherapy agents trastuzumab, pertuzumab, docetaxel, carboplatin.        To help prevent nausea and vomiting after your treatment, we encourage you to take your nausea medication as directed.  BELOW ARE SYMPTOMS THAT SHOULD BE REPORTED IMMEDIATELY: . *FEVER GREATER THAN 100.4 F (38 C) OR HIGHER . *CHILLS OR SWEATING . *NAUSEA AND VOMITING THAT IS NOT CONTROLLED WITH YOUR NAUSEA MEDICATION . *UNUSUAL SHORTNESS OF BREATH . *UNUSUAL BRUISING OR BLEEDING . *URINARY PROBLEMS (pain or burning when urinating, or frequent urination) . *BOWEL PROBLEMS (unusual diarrhea, constipation, pain near the anus) . TENDERNESS IN MOUTH AND THROAT WITH OR WITHOUT PRESENCE OF ULCERS (sore throat, sores in mouth, or a toothache) . UNUSUAL RASH, SWELLING OR PAIN  . UNUSUAL VAGINAL DISCHARGE OR ITCHING   Items with * indicate a potential emergency and should be followed up as soon as possible or go to the Emergency Department if any problems should occur.  Please show the  CHEMOTHERAPY ALERT CARD or IMMUNOTHERAPY ALERT CARD at check-in to the Emergency Department and triage nurse.  Should you have questions after your visit or need to cancel or reschedule your appointment, please contact [facility]  Dept: [phone]  and follow the prompts.  Office hours are 8:00 a.m. to 4:30 p.m. Monday - Friday. Please note that voicemails left after 4:00 p.m. may not be returned until the following business day.  We are closed weekends and major holidays. You have access to a nurse at all times for urgent questions. Please call the main number to the clinic Dept: [phone] and follow the prompts.   For any non-urgent questions, you may also contact your provider using MyChart. We now offer e-Visits for anyone 37 and older to request care online for non-urgent symptoms. For details visit [email or web address].   Also download the MyChart app! Go to the app store, search "MyChart", open the app, select [facility], and log in with your MyChart username and password.  Due to Covid, a mask is required upon entering the hospital/clinic. If you do not have a mask, one will be given to you upon arrival. For doctor visits, patients may have 1 support person aged 29 or older with them. For treatment visits, patients cannot have anyone with them due to current Covid guidelines and our immunocompromised population.

## 2021-03-18 ENCOUNTER — Inpatient Hospital Stay: Payer: 59

## 2021-03-18 ENCOUNTER — Other Ambulatory Visit: Payer: Self-pay

## 2021-03-18 VITALS — BP 120/81 | HR 78 | Temp 98.3°F | Resp 16

## 2021-03-18 DIAGNOSIS — C50412 Malignant neoplasm of upper-outer quadrant of left female breast: Secondary | ICD-10-CM

## 2021-03-18 DIAGNOSIS — Z5111 Encounter for antineoplastic chemotherapy: Secondary | ICD-10-CM | POA: Diagnosis not present

## 2021-03-18 MED ORDER — PEGFILGRASTIM-BMEZ 6 MG/0.6ML ~~LOC~~ SOSY
6.0000 mg | PREFILLED_SYRINGE | Freq: Once | SUBCUTANEOUS | Status: AC
  Administered 2021-03-18: 6 mg via SUBCUTANEOUS

## 2021-03-18 MED ORDER — PEGFILGRASTIM-BMEZ 6 MG/0.6ML ~~LOC~~ SOSY
PREFILLED_SYRINGE | SUBCUTANEOUS | Status: AC
  Filled 2021-03-18: qty 0.6

## 2021-03-18 NOTE — Patient Instructions (Signed)
Pegfilgrastim injection What is this medicine? PEGFILGRASTIM (PEG fil gra stim) is a long-acting granulocyte colony-stimulating factor that stimulates the growth of neutrophils, a type of white blood cell important in the body's fight against infection. It is used to reduce the incidence of fever and infection in patients with certain types of cancer who are receiving chemotherapy that affects the bone marrow, and to increase survival after being exposed to high doses of radiation. This medicine may be used for other purposes; ask your health care provider or pharmacist if you have questions. COMMON BRAND NAME(S): Fulphila, Neulasta, Nyvepria, UDENYCA, Ziextenzo What should I tell my health care provider before I take this medicine? They need to know if you have any of these conditions:  kidney disease  latex allergy  ongoing radiation therapy  sickle cell disease  skin reactions to acrylic adhesives (On-Body Injector only)  an unusual or allergic reaction to pegfilgrastim, filgrastim, other medicines, foods, dyes, or preservatives  pregnant or trying to get pregnant  breast-feeding How should I use this medicine? This medicine is for injection under the skin. If you get this medicine at home, you will be taught how to prepare and give the pre-filled syringe or how to use the On-body Injector. Refer to the patient Instructions for Use for detailed instructions. Use exactly as directed. Tell your healthcare provider immediately if you suspect that the On-body Injector may not have performed as intended or if you suspect the use of the On-body Injector resulted in a missed or partial dose. It is important that you put your used needles and syringes in a special sharps container. Do not put them in a trash can. If you do not have a sharps container, call your pharmacist or healthcare provider to get one. Talk to your pediatrician regarding the use of this medicine in children. While this drug  may be prescribed for selected conditions, precautions do apply. Overdosage: If you think you have taken too much of this medicine contact a poison control center or emergency room at once. NOTE: This medicine is only for you. Do not share this medicine with others. What if I miss a dose? It is important not to miss your dose. Call your doctor or health care professional if you miss your dose. If you miss a dose due to an On-body Injector failure or leakage, a new dose should be administered as soon as possible using a single prefilled syringe for manual use. What may interact with this medicine? Interactions have not been studied. This list may not describe all possible interactions. Give your health care provider a list of all the medicines, herbs, non-prescription drugs, or dietary supplements you use. Also tell them if you smoke, drink alcohol, or use illegal drugs. Some items may interact with your medicine. What should I watch for while using this medicine? Your condition will be monitored carefully while you are receiving this medicine. You may need blood work done while you are taking this medicine. Talk to your health care provider about your risk of cancer. You may be more at risk for certain types of cancer if you take this medicine. If you are going to need a MRI, CT scan, or other procedure, tell your doctor that you are using this medicine (On-Body Injector only). What side effects may I notice from receiving this medicine? Side effects that you should report to your doctor or health care professional as soon as possible:  allergic reactions (skin rash, itching or hives, swelling of   the face, lips, or tongue)  back pain  dizziness  fever  pain, redness, or irritation at site where injected  pinpoint red spots on the skin  red or dark-brown urine  shortness of breath or breathing problems  stomach or side pain, or pain at the shoulder  swelling  tiredness  trouble  passing urine or change in the amount of urine  unusual bruising or bleeding Side effects that usually do not require medical attention (report to your doctor or health care professional if they continue or are bothersome):  bone pain  muscle pain This list may not describe all possible side effects. Call your doctor for medical advice about side effects. You may report side effects to FDA at 1-800-FDA-1088. Where should I keep my medicine? Keep out of the reach of children. If you are using this medicine at home, you will be instructed on how to store it. Throw away any unused medicine after the expiration date on the label. NOTE: This sheet is a summary. It may not cover all possible information. If you have questions about this medicine, talk to your doctor, pharmacist, or health care provider.  2021 Elsevier/Gold Standard (2019-10-21 13:20:51)  

## 2021-04-01 ENCOUNTER — Other Ambulatory Visit: Payer: Self-pay | Admitting: *Deleted

## 2021-04-01 DIAGNOSIS — C50412 Malignant neoplasm of upper-outer quadrant of left female breast: Secondary | ICD-10-CM

## 2021-04-01 NOTE — Progress Notes (Signed)
Patient Care Team: London Pepper, MD as PCP - General (Family Medicine) Mauro Kaufmann, RN as Oncology Nurse Navigator Rockwell Germany, RN as Oncology Nurse Navigator Rolm Bookbinder, MD as Consulting Physician (General Surgery) Nicholas Lose, MD as Consulting Physician (Hematology and Oncology) Eppie Gibson, MD as Attending Physician (Radiation Oncology)  DIAGNOSIS:    ICD-10-CM   1. Malignant neoplasm of upper-outer quadrant of left breast in female, estrogen receptor negative ([place])  C50.412 MR BREAST BILATERAL W [place] CAD   Z17.1       SUMMARY OF ONCOLOGIC HISTORY: Oncology History  Malignant neoplasm of upper-outer quadrant of left breast in female, estrogen receptor negative ([facility])  12/19/2020 Initial Diagnosis   Patient palpated a left breast mass and noted left axilla tenderness for one week. Diagnostic mammogram and US showed a conglomeration of masses in the left breast at the 2 o'clock position 3-4cm from the nipple spanning 3.9cm total, with a 0.7cm mass at the 2 o'clock position 6cm from the nipple, and two abnormal axillary lymph nodes. 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   12/26/2020 Cancer Staging   Staging form: Breast, AJCC 8th Edition - Clinical stage from 12/26/2020: Stage IIIA (cT3, cN1, cM0, G3, ER-, PR-, HER2+) - Signed by Nicholas Lose, MD on 12/26/2020  Stage prefix: Initial diagnosis  Histologic grading system: 3 grade system    01/08/2021 -  Chemotherapy    Patient is on Treatment Plan: BREAST  DOCETAXEL + CARBOPLATIN + TRASTUZUMAB + PERTUZUMAB  (TCHP) Q21D        01/18/2021 Genetic Testing   Negative genetic testing:  No pathogenic variants detected on the [facility] CancerNext-Expanded + RNAinsight panel. The report date is 01/18/2021.   The CancerNext-Expanded + RNAinsight gene panel offered by [employer] and includes sequencing and rearrangement analysis for the  following 77 genes: AIP, ALK, APC, ATM, AXIN2, BAP1, BARD1, BLM, BMPR1A, BRCA1, BRCA2, BRIP1, CDC73, CDH1, CDK4, CDKN1B, CDKN2A, CHEK2, CTNNA1, DICER1, FANCC, FH, FLCN, GALNT12, KIF1B, LZTR1, MAX, MEN1, MET, MLH1, MSH2, MSH3, MSH6, MUTYH, NBN, NF1, NF2, NTHL1, PALB2, PHOX2B, PMS2, POT1, PRKAR1A, PTCH1, PTEN, RAD51C, RAD51D, RB1, RECQL, RET, SDHA, SDHAF2, SDHB, SDHC, SDHD, SMAD4, SMARCA4, SMARCB1, SMARCE1, STK11, SUFU, TMEM127, TP53, TSC1, TSC2, VHL and XRCC2 (sequencing and deletion/duplication); EGFR, EGLN1, HOXB13, KIT, MITF, PDGFRA, POLD1 and POLE (sequencing only); EPCAM and GREM1 (deletion/duplication only). RNA data is routinely analyzed for use in variant interpretation for all genes.     CHIEF COMPLIANT: Cycle 5 Perjeta  INTERVAL HISTORY: Maria [facility] is a 50 y.o. with above-mentioned history of left breast cancer currently on neoadjuvant chemotherapy with TCH Perjeta. She presents to the clinic today for a toxicity check and cycle 5.  Her major complaints during chemotherapy are fatigue, intermittent diarrhea, mild nausea, lower extremity discomfort. Denies any significant peripheral neuropathy.  She does have mild tingling at the tips of the fingers.  Intermittent diarrhea is not too bothersome.  She is drinking plenty of fluids.  Husband reports that she is eating very well.  ALLERGIES:  is allergic to latex.  MEDICATIONS:  Current Outpatient Medications  Medication Sig Dispense Refill   acetaminophen (TYLENOL) 650 MG CR tablet Take 650 mg by mouth every 8 (eight) hours as needed for pain.     albuterol (VENTOLIN HFA) 108 (90 Base) MCG/ACT inhaler Inhale 2 puffs into the lungs every 6 (six) hours as needed for wheezing or shortness of breath.  amphetamine-dextroamphetamine (ADDERALL XR) 20 MG 24 hr capsule Take 20 mg by mouth daily as needed.     dexamethasone (DECADRON) 4 MG tablet Take 1 tablet (4 mg total) by mouth daily. Take 1 tablet day before chemo and 1 tablet day after  chemo with food 12 tablet 0   lidocaine-prilocaine (EMLA) cream Apply to affected area once 30 g 3   LORazepam (ATIVAN) 0.5 MG tablet Take 1 tablet (0.5 mg total) by mouth at bedtime. 30 tablet 3   Menaquinone-7 (VITAMIN K2) 40 MCG TABS Take 2 tablets by mouth daily. 80 mcg daily total     ondansetron (ZOFRAN) 8 MG tablet Take 1 tablet (8 mg total) by mouth 2 (two) times daily as needed (Nausea or vomiting). Start on the third day after chemotherapy. 30 tablet 1   prochlorperazine (COMPAZINE) 10 MG tablet Take 1 tablet (10 mg total) by mouth every 6 (six) hours as needed (Nausea or vomiting). 30 tablet 1   VITAMIN D PO Take 5,000 Int'l Units by mouth every other day.     No current facility-administered medications for this visit.    PHYSICAL EXAMINATION: ECOG PERFORMANCE STATUS: 1 - Symptomatic but completely ambulatory  Vitals:   04/02/21 0856  BP: 136/90  Pulse: 99  Resp: 18  Temp: 97.6 F (36.4 C)  SpO2: 100%   Filed Weights   04/02/21 0856  Weight: 176 lb 1.6 oz (79.9 kg)    LABORATORY DATA:  I have reviewed the data as listed CMP Latest Ref Rng & Units 03/14/2021 02/19/2021 01/29/2021  Glucose 70 - 99 mg/dL 108(H) 99 144(H)  BUN 6 - 20 mg/dL 17 14 14   Creatinine 0.44 - 1.00 mg/dL 0.77 0.78 0.85  Sodium 135 - 145 mmol/L 141 141 140  Potassium 3.5 - 5.1 mmol/L 4.4 4.2 4.4  Chloride 98 - 111 mmol/L 108 109 108  CO2 22 - 32 mmol/L 22 25 23   Calcium 8.9 - 10.3 mg/dL 9.1 9.0 9.1  Total Protein 6.5 - 8.1 g/dL 6.6 6.7 7.0  Total Bilirubin 0.3 - 1.2 mg/dL 0.2(L) 0.2(L) <0.2(L)  Alkaline Phos 38 - 126 U/L 69 80 70  AST 15 - 41 U/L 49(H) 61(H) 16  ALT 0 - 44 U/L 69(H) 100(H) 44    Lab Results  Component Value Date   WBC 7.1 04/02/2021   HGB 10.1 (L) 04/02/2021   HCT 31.4 (L) 04/02/2021   MCV 92.6 04/02/2021   PLT 225 04/02/2021   NEUTROABS 6.2 04/02/2021    ASSESSMENT & PLAN:  Malignant neoplasm of upper-outer quadrant of left breast in female, estrogen receptor  negative ([facility]) 12/19/2020: Palpable left breast mass and left axillary tenderness.  Mammogram revealed conglomeration of masses 3.9 cm and an adjacent 0.7 cm mass and 2 axillary lymph nodes that were abnormal. Biopsy revealed IDC with DCIS, grade 3, ER/PR negative, HER-2 +3+ by [facility]   12/24/2020: Breast MRI revealed mass or non-mass enhancement measuring 9.5 cm and 3 abnormal lymph nodes 01-02-21: Bone scan: No bone mets 01/01/2021: CT CAP: No distant metastatic disease   Treatment Plan: 1. Neoadjuvant chemotherapy with TCH Perjeta 6 cycles followed by Herceptin Perjeta maintenance versus Kadcyla maintenance (based on response to neoadjuvant chemo) for 1 year 2. Followed by breast conserving surgery and targeted lymph node surgery 3. Followed by adjuvant radiation therapy  -------------------------------------------------------------------------------------------------------------------------------- Current Treatment: Cycle 5 TCHP ECHO 01/02/21: EF 60-65%   Chemo toxicities: 1.  Severe bone pain related to Neulasta: Improved 2. diarrhea: Intermittently: Takes Imodium  on and off 3.    Fatigue: She is having difficulty with walking long distances or climbing up stairs because of fatigue.  It is likely chemo related.  Her hemoglobin today appears to be stable at 10.1. 4.  Chemo induced anemia: Monitoring closely. 5.  Mild nausea: Improved with antinausea medication 6.  Bilateral lower extremity pains in the thighs and calves: Definitely related to Taxotere. 7.  Elevated LFTs: Monitoring closely   Monitoring closely for neuropathy.   Return to clinic in 3 weeks for cycle 6    Orders Placed This Encounter  Procedures   MR BREAST BILATERAL W [place] CAD    Standing Status:   Future    Standing Expiration Date:   04/02/2022    Order Specific Question:   If indicated for the ordered procedure, I authorize the administration of contrast media per Radiology protocol    Answer:   Yes     Order Specific Question:   What is the patient's sedation requirement?    Answer:   No Sedation    Order Specific Question:   Does the patient have a pacemaker or implanted devices?    Answer:   No    Order Specific Question:   Preferred imaging location?    Answer:   [facility] (table limit-550lbs)    Order Specific Question:   Release to patient    Answer:   Immediate    The patient has a good understanding of the overall plan. she agrees with it. she will call with any problems that may develop before the next visit here.  Total time spent: 30 mins including face to face time and time spent for planning, charting and coordination of care  Rulon Eisenmenger, MD, MPH 04/02/2021  I, Thana Ates, am acting as scribe for Dr. Nicholas Lose.  I have reviewed the above documentation for accuracy and completeness, and I agree with the above.

## 2021-04-01 NOTE — Assessment & Plan Note (Signed)
12/19/2020: Palpable left breast mass and left axillary tenderness. Mammogram revealed conglomeration of masses 3.9 cm and an adjacent 0.7 cm mass and 2 axillary lymph nodes that were abnormal. Biopsy revealed IDC with DCIS, grade 3, ER/PR negative, HER-2 +3+ by [facility]  12/24/2020: Breast MRI revealed mass or non-mass enhancement measuring 9.5 cm and 3 abnormal lymph nodes 01-02-21: Bone scan: No bone mets 01/01/2021: CT CAP: No distant metastatic disease  Treatment Plan: 1. Neoadjuvant chemotherapy with TCH Perjeta 6 cycles followed by Herceptin Perjeta maintenance versus Kadcyla maintenance (based on response to neoadjuvant chemo) for 1 year 2. Followed by breast conserving surgeryand targeted lymph node surgery 3. Followed by adjuvant radiation therapy -------------------------------------------------------------------------------------------------------------------------------- Current Treatment: Cycle5TCHP ECHO 01/02/21: EF 60-65%  Chemo toxicities: 1.Severe bone pain related to Neulasta:Improved 2.diarrhea: Intermittently:Takes Imodium on and off 3.Fatigue: She is having difficulty with walking long distances or climbing up stairs because of fatigue.  It is likely chemo related.  Her hemoglobin today appears to be stable at 10.2. 4.Chemo induced anemia: Monitoring closely. 5.  Mild nausea: Improved with antinausea medication 6.  Bilateral lower extremity pains in the thighs and calves: Probably because of heavy gardening that she performed. 7.  Elevated LFTs: Waiting for today's blood work to come back. Her chemo was set up for tomorrow. Monitoring closely for neuropathy.  Return to clinic in 3 weeks for cycle 6

## 2021-04-02 ENCOUNTER — Inpatient Hospital Stay: Payer: 59

## 2021-04-02 ENCOUNTER — Inpatient Hospital Stay (HOSPITAL_BASED_OUTPATIENT_CLINIC_OR_DEPARTMENT_OTHER): Payer: 59 | Admitting: Hematology and Oncology

## 2021-04-02 ENCOUNTER — Encounter: Payer: Self-pay | Admitting: *Deleted

## 2021-04-02 ENCOUNTER — Other Ambulatory Visit: Payer: Self-pay

## 2021-04-02 DIAGNOSIS — Z171 Estrogen receptor negative status [ER-]: Secondary | ICD-10-CM

## 2021-04-02 DIAGNOSIS — Z95828 Presence of other vascular implants and grafts: Secondary | ICD-10-CM

## 2021-04-02 DIAGNOSIS — C50412 Malignant neoplasm of upper-outer quadrant of left female breast: Secondary | ICD-10-CM

## 2021-04-02 DIAGNOSIS — Z5111 Encounter for antineoplastic chemotherapy: Secondary | ICD-10-CM | POA: Diagnosis not present

## 2021-04-02 LAB — CMP (CANCER CENTER ONLY)
ALT: 49 U/L — ABNORMAL HIGH (ref 0–44)
AST: 40 U/L (ref 15–41)
Albumin: 4.1 g/dL (ref 3.5–5.0)
Alkaline Phosphatase: 75 U/L (ref 38–126)
Anion gap: 7 (ref 5–15)
BUN: 15 mg/dL (ref 6–20)
CO2: 24 mmol/L (ref 22–32)
Calcium: 8.9 mg/dL (ref 8.9–10.3)
Chloride: 107 mmol/L (ref 98–111)
Creatinine: 0.72 mg/dL (ref 0.44–1.00)
GFR, Estimated: >60 mL/min (ref >60)
Glucose, Bld: 214 mg/dL — ABNORMAL HIGH (ref 70–99)
Potassium: 4.2 mmol/L (ref 3.5–5.1)
Sodium: 138 mmol/L (ref 135–145)
Total Bilirubin: 0.3 mg/dL (ref 0.3–1.2)
Total Protein: 7 g/dL (ref 6.5–8.1)

## 2021-04-02 LAB — CBC WITH DIFFERENTIAL (CANCER CENTER ONLY)
Abs Immature Granulocytes: 0.06 10*3/uL (ref 0.00–0.07)
Basophils Absolute: 0 10*3/uL (ref 0.0–0.1)
Basophils Relative: 0 %
Eosinophils Absolute: 0 10*3/uL (ref 0.0–0.5)
Eosinophils Relative: 0 %
HCT: 31.4 % — ABNORMAL LOW (ref 36.0–46.0)
Hemoglobin: 10.1 g/dL — ABNORMAL LOW (ref 12.0–15.0)
Immature Granulocytes: 1 %
Lymphocytes Relative: 11 %
Lymphs Abs: 0.8 10*3/uL (ref 0.7–4.0)
MCH: 29.8 pg (ref 26.0–34.0)
MCHC: 32.2 g/dL (ref 30.0–36.0)
MCV: 92.6 fL (ref 80.0–100.0)
Monocytes Absolute: 0.1 10*3/uL (ref 0.1–1.0)
Monocytes Relative: 1 %
Neutro Abs: 6.2 10*3/uL (ref 1.7–7.7)
Neutrophils Relative %: 87 %
Platelet Count: 225 10*3/uL (ref 150–400)
RBC: 3.39 MIL/uL — ABNORMAL LOW (ref 3.87–5.11)
RDW: 19.3 % — ABNORMAL HIGH (ref 11.5–15.5)
WBC Count: 7.1 10*3/uL (ref 4.0–10.5)
nRBC: 0 % (ref 0.0–0.2)

## 2021-04-02 MED ORDER — SODIUM CHLORIDE 0.9 % IV SOLN
150.0000 mg | Freq: Once | INTRAVENOUS | Status: AC
  Administered 2021-04-02: 150 mg via INTRAVENOUS
  Filled 2021-04-02: qty 150

## 2021-04-02 MED ORDER — SODIUM CHLORIDE 0.9 % IV SOLN
420.0000 mg | Freq: Once | INTRAVENOUS | Status: AC
  Administered 2021-04-02: 420 mg via INTRAVENOUS
  Filled 2021-04-02: qty 14

## 2021-04-02 MED ORDER — HEPARIN SOD (PORK) LOCK FLUSH 100 UNIT/ML IV SOLN
500.0000 [IU] | Freq: Once | INTRAVENOUS | Status: AC | PRN
  Administered 2021-04-02: 500 [IU]
  Filled 2021-04-02: qty 5

## 2021-04-02 MED ORDER — DIPHENHYDRAMINE HCL 25 MG PO CAPS
25.0000 mg | ORAL_CAPSULE | Freq: Once | ORAL | Status: AC
  Administered 2021-04-02: 25 mg via ORAL

## 2021-04-02 MED ORDER — SODIUM CHLORIDE 0.9% FLUSH
10.0000 mL | INTRAVENOUS | Status: DC | PRN
  Administered 2021-04-02: 10 mL
  Filled 2021-04-02: qty 10

## 2021-04-02 MED ORDER — SODIUM CHLORIDE 0.9 % IV SOLN
10.0000 mg | Freq: Once | INTRAVENOUS | Status: AC
  Administered 2021-04-02: 10 mg via INTRAVENOUS
  Filled 2021-04-02: qty 10

## 2021-04-02 MED ORDER — SODIUM CHLORIDE 0.9% FLUSH
10.0000 mL | Freq: Once | INTRAVENOUS | Status: AC
  Administered 2021-04-02: 10 mL
  Filled 2021-04-02: qty 10

## 2021-04-02 MED ORDER — ACETAMINOPHEN 325 MG PO TABS
650.0000 mg | ORAL_TABLET | Freq: Once | ORAL | Status: AC
  Administered 2021-04-02: 650 mg via ORAL

## 2021-04-02 MED ORDER — DIPHENHYDRAMINE HCL 25 MG PO CAPS
ORAL_CAPSULE | ORAL | Status: AC
  Filled 2021-04-02: qty 1

## 2021-04-02 MED ORDER — SODIUM CHLORIDE 0.9 % IV SOLN
Freq: Once | INTRAVENOUS | Status: AC
  Filled 2021-04-02: qty 250

## 2021-04-02 MED ORDER — TRASTUZUMAB-ANNS CHEMO 150 MG IV SOLR
450.0000 mg | Freq: Once | INTRAVENOUS | Status: AC
  Administered 2021-04-02: 450 mg via INTRAVENOUS
  Filled 2021-04-02: qty 21.43

## 2021-04-02 MED ORDER — ACETAMINOPHEN 325 MG PO TABS
ORAL_TABLET | ORAL | Status: AC
  Filled 2021-04-02: qty 2

## 2021-04-02 MED ORDER — SODIUM CHLORIDE 0.9 % IV SOLN
700.0000 mg | Freq: Once | INTRAVENOUS | Status: AC
  Administered 2021-04-02: 700 mg via INTRAVENOUS
  Filled 2021-04-02: qty 70

## 2021-04-02 MED ORDER — PALONOSETRON HCL INJECTION 0.25 MG/5ML
0.2500 mg | Freq: Once | INTRAVENOUS | Status: AC
  Administered 2021-04-02: 0.25 mg via INTRAVENOUS

## 2021-04-02 MED ORDER — PALONOSETRON HCL INJECTION 0.25 MG/5ML
INTRAVENOUS | Status: AC
  Filled 2021-04-02: qty 5

## 2021-04-02 MED ORDER — SODIUM CHLORIDE 0.9 % IV SOLN
75.0000 mg/m2 | Freq: Once | INTRAVENOUS | Status: AC
  Administered 2021-04-02: 140 mg via INTRAVENOUS
  Filled 2021-04-02: qty 14

## 2021-04-02 NOTE — Progress Notes (Signed)
OK to treat today with Echo from March 23. Echo has been ordered

## 2021-04-02 NOTE — Patient Instructions (Signed)
[facility]  Discharge Instructions: Thank you for choosing [facility] to provide your oncology and hematology care.   If you have a lab appointment with the [facility], please go directly to the [facility] and check in at the registration area.   Wear comfortable clothing and clothing appropriate for easy access to any Portacath or PICC line.   We strive to give you quality time with your provider. You may need to reschedule your appointment if you arrive late (15 or more minutes).  Arriving late affects you and other patients whose appointments are after yours.  Also, if you miss three or more appointments without notifying the office, you may be dismissed from the clinic at the provider's discretion.      For prescription refill requests, have your pharmacy contact our office and allow 72 hours for refills to be completed.    Today you received the following chemotherapy and/or immunotherapy agents trastuzumab, pertuzumab, docetaxel, carboplatin.        To help prevent nausea and vomiting after your treatment, we encourage you to take your nausea medication as directed.  BELOW ARE SYMPTOMS THAT SHOULD BE REPORTED IMMEDIATELY: *FEVER GREATER THAN 100.4 F (38 C) OR HIGHER *CHILLS OR SWEATING *NAUSEA AND VOMITING THAT IS NOT CONTROLLED WITH YOUR NAUSEA MEDICATION *UNUSUAL SHORTNESS OF BREATH *UNUSUAL BRUISING OR BLEEDING *URINARY PROBLEMS (pain or burning when urinating, or frequent urination) *BOWEL PROBLEMS (unusual diarrhea, constipation, pain near the anus) TENDERNESS IN MOUTH AND THROAT WITH OR WITHOUT PRESENCE OF ULCERS (sore throat, sores in mouth, or a toothache) UNUSUAL RASH, SWELLING OR PAIN  UNUSUAL VAGINAL DISCHARGE OR ITCHING   Items with * indicate a potential emergency and should be followed up as soon as possible or go to the Emergency Department if any problems should occur.  Please show the CHEMOTHERAPY ALERT CARD or  IMMUNOTHERAPY ALERT CARD at check-in to the Emergency Department and triage nurse.  Should you have questions after your visit or need to cancel or reschedule your appointment, please contact [facility]  Dept: [phone]  and follow the prompts.  Office hours are 8:00 a.m. to 4:30 p.m. Monday - Friday. Please note that voicemails left after 4:00 p.m. may not be returned until the following business day.  We are closed weekends and major holidays. You have access to a nurse at all times for urgent questions. Please call the main number to the clinic Dept: [phone] and follow the prompts.   For any non-urgent questions, you may also contact your provider using MyChart. We now offer e-Visits for anyone 15 and older to request care online for non-urgent symptoms. For details visit [email or web address].   Also download the MyChart app! Go to the app store, search "MyChart", open the app, select [facility], and log in with your MyChart username and password.  Due to Covid, a mask is required upon entering the hospital/clinic. If you do not have a mask, one will be given to you upon arrival. For doctor visits, patients may have 1 support person aged 65 or older with them. For treatment visits, patients cannot have anyone with them due to current Covid guidelines and our immunocompromised population.

## 2021-04-04 ENCOUNTER — Inpatient Hospital Stay: Payer: 59

## 2021-04-04 ENCOUNTER — Other Ambulatory Visit: Payer: Self-pay

## 2021-04-04 VITALS — BP 137/96 | HR 91 | Temp 98.5°F | Resp 16

## 2021-04-04 DIAGNOSIS — C50412 Malignant neoplasm of upper-outer quadrant of left female breast: Secondary | ICD-10-CM

## 2021-04-04 DIAGNOSIS — Z5111 Encounter for antineoplastic chemotherapy: Secondary | ICD-10-CM | POA: Diagnosis not present

## 2021-04-04 MED ORDER — PEGFILGRASTIM-BMEZ 6 MG/0.6ML ~~LOC~~ SOSY
6.0000 mg | PREFILLED_SYRINGE | Freq: Once | SUBCUTANEOUS | Status: AC
  Administered 2021-04-04: 6 mg via SUBCUTANEOUS

## 2021-04-04 MED ORDER — PEGFILGRASTIM-BMEZ 6 MG/0.6ML ~~LOC~~ SOSY
PREFILLED_SYRINGE | SUBCUTANEOUS | Status: AC
  Filled 2021-04-04: qty 0.6

## 2021-04-04 NOTE — Patient Instructions (Signed)

## 2021-04-12 ENCOUNTER — Telehealth: Payer: Self-pay | Admitting: Hematology and Oncology

## 2021-04-12 NOTE — Telephone Encounter (Signed)
Sch per 5/10 los, pt aware.  

## 2021-04-14 ENCOUNTER — Ambulatory Visit
Admission: RE | Admit: 2021-04-14 | Discharge: 2021-04-14 | Disposition: A | Discharge status: Home / Self Care | Payer: 59 | Source: Ambulatory Visit | Attending: Hematology and Oncology | Admitting: Hematology and Oncology

## 2021-04-14 ENCOUNTER — Other Ambulatory Visit: Payer: Self-pay

## 2021-04-14 DIAGNOSIS — Z171 Estrogen receptor negative status [ER-]: Secondary | ICD-10-CM

## 2021-04-14 MED ORDER — GADOBUTROL 1 MMOL/ML IV SOLN
8.0000 mL | Freq: Once | INTRAVENOUS | Status: AC | PRN
  Administered 2021-04-14: 8 mL via INTRAVENOUS

## 2021-04-16 ENCOUNTER — Encounter: Payer: Self-pay | Admitting: *Deleted

## 2021-04-17 ENCOUNTER — Ambulatory Visit (HOSPITAL_COMMUNITY)
Admission: RE | Admit: 2021-04-17 | Discharge: 2021-04-17 | Disposition: A | Discharge status: Home / Self Care | Payer: 59 | Source: Ambulatory Visit | Attending: Hematology and Oncology | Admitting: Hematology and Oncology

## 2021-04-17 ENCOUNTER — Other Ambulatory Visit: Payer: Self-pay

## 2021-04-17 DIAGNOSIS — Z5181 Encounter for therapeutic drug level monitoring: Secondary | ICD-10-CM | POA: Diagnosis present

## 2021-04-17 DIAGNOSIS — I517 Cardiomegaly: Secondary | ICD-10-CM | POA: Insufficient documentation

## 2021-04-17 DIAGNOSIS — Z79899 Other long term (current) drug therapy: Secondary | ICD-10-CM | POA: Insufficient documentation

## 2021-04-17 DIAGNOSIS — Z0189 Encounter for other specified special examinations: Secondary | ICD-10-CM

## 2021-04-17 DIAGNOSIS — C50412 Malignant neoplasm of upper-outer quadrant of left female breast: Secondary | ICD-10-CM | POA: Diagnosis not present

## 2021-04-17 DIAGNOSIS — Z171 Estrogen receptor negative status [ER-]: Secondary | ICD-10-CM

## 2021-04-17 LAB — ECHOCARDIOGRAM COMPLETE
Area-P 1/2: 3.39 cm2
Calc EF: 57.6 %
S' Lateral: 2.75 cm
Single Plane A2C EF: 55.6 %
Single Plane A4C EF: 62 %

## 2021-04-17 NOTE — Progress Notes (Signed)
  Echocardiogram 2D Echocardiogram has been performed.  [facility] R 04/17/2021, 9:22 AM

## 2021-04-22 NOTE — Progress Notes (Signed)
Patient Care Team: London Pepper, MD as PCP - General (Family Medicine) Mauro Kaufmann, RN as Oncology Nurse Navigator Rockwell Germany, RN as Oncology Nurse Navigator Rolm Bookbinder, MD as Consulting Physician (General Surgery) Nicholas Lose, MD as Consulting Physician (Hematology and Oncology) Eppie Gibson, MD as Attending Physician (Radiation Oncology)  DIAGNOSIS:    ICD-10-CM   1. Malignant neoplasm of upper-outer quadrant of left breast in female, estrogen receptor negative ([place])  C50.412    Z17.1       SUMMARY OF ONCOLOGIC HISTORY: Oncology History  Malignant neoplasm of upper-outer quadrant of left breast in female, estrogen receptor negative ([place])  12/19/2020 Initial Diagnosis   Patient palpated a left breast mass and noted left axilla tenderness for one week. Diagnostic mammogram and US showed a conglomeration of masses in the left breast at the 2 o'clock position 3-4cm from the nipple spanning 3.9cm total, with a 0.7cm mass at the 2 o'clock position 6cm from the nipple, and two abnormal axillary lymph nodes. 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   12/26/2020 Cancer Staging   Staging form: Breast, AJCC 8th Edition - Clinical stage from 12/26/2020: Stage IIIA (cT3, cN1, cM0, G3, ER-, PR-, HER2+) - Signed by Nicholas Lose, MD on 12/26/2020  Stage prefix: Initial diagnosis  Histologic grading system: 3 grade system    01/08/2021 -  Chemotherapy    Patient is on Treatment Plan: BREAST  DOCETAXEL + CARBOPLATIN + TRASTUZUMAB + PERTUZUMAB  (TCHP) Q21D        01/18/2021 Genetic Testing   Negative genetic testing:  No pathogenic variants detected on the [facility] CancerNext-Expanded + RNAinsight panel. The report date is 01/18/2021.   The CancerNext-Expanded + RNAinsight gene panel offered by [employer] and includes sequencing and rearrangement analysis for the following 77 genes: AIP, ALK, APC, ATM, AXIN2,  BAP1, BARD1, BLM, BMPR1A, BRCA1, BRCA2, BRIP1, CDC73, CDH1, CDK4, CDKN1B, CDKN2A, CHEK2, CTNNA1, DICER1, FANCC, FH, FLCN, GALNT12, KIF1B, LZTR1, MAX, MEN1, MET, MLH1, MSH2, MSH3, MSH6, MUTYH, NBN, NF1, NF2, NTHL1, PALB2, PHOX2B, PMS2, POT1, PRKAR1A, PTCH1, PTEN, RAD51C, RAD51D, RB1, RECQL, RET, SDHA, SDHAF2, SDHB, SDHC, SDHD, SMAD4, SMARCA4, SMARCB1, SMARCE1, STK11, SUFU, TMEM127, TP53, TSC1, TSC2, VHL and XRCC2 (sequencing and deletion/duplication); EGFR, EGLN1, HOXB13, KIT, MITF, PDGFRA, POLD1 and POLE (sequencing only); EPCAM and GREM1 (deletion/duplication only). RNA data is routinely analyzed for use in variant interpretation for all genes.     CHIEF COMPLIANT: Cycle 6 TCHP  INTERVAL HISTORY: Kammy Dirico is a 50 y.o. with above-mentioned history of breast cancer currently on neoadjuvant chemotherapy with [place]. MRI Breast on 04/14/21 showed persistent non mass enhancement in the UOQ of the left breast, significantly improved left axillary adenopathy, and no evidence of malignancy in the right breast. She presents to the clinic today for a toxicity check and cycle 6.   ALLERGIES:  is allergic to latex.  MEDICATIONS:  Current Outpatient Medications  Medication Sig Dispense Refill   acetaminophen (TYLENOL) 650 MG CR tablet Take 650 mg by mouth every 8 (eight) hours as needed for pain.     albuterol (VENTOLIN HFA) 108 (90 Base) MCG/ACT inhaler Inhale 2 puffs into the lungs every 6 (six) hours as needed for wheezing or shortness of breath.     amphetamine-dextroamphetamine (ADDERALL XR) 20 MG 24 hr capsule Take 20 mg by mouth daily as needed.     dexamethasone (DECADRON) 4 MG tablet Take 1 tablet (4 mg total)  by mouth daily. Take 1 tablet day before chemo and 1 tablet day after chemo with food 12 tablet 0   lidocaine-prilocaine (EMLA) cream Apply to affected area once 30 g 3   LORazepam (ATIVAN) 0.5 MG tablet Take 1 tablet (0.5 mg total) by mouth at bedtime. 30 tablet 3   Menaquinone-7  (VITAMIN K2) 40 MCG TABS Take 2 tablets by mouth daily. 80 mcg daily total     ondansetron (ZOFRAN) 8 MG tablet Take 1 tablet (8 mg total) by mouth 2 (two) times daily as needed (Nausea or vomiting). Start on the third day after chemotherapy. 30 tablet 1   prochlorperazine (COMPAZINE) 10 MG tablet Take 1 tablet (10 mg total) by mouth every 6 (six) hours as needed (Nausea or vomiting). 30 tablet 1   VITAMIN D PO Take 5,000 Int'l Units by mouth every other day.     No current facility-administered medications for this visit.    PHYSICAL EXAMINATION: ECOG PERFORMANCE STATUS: 1 - Symptomatic but completely ambulatory  Vitals:   04/23/21 1022  BP: (!) 142/87  Pulse: 100  Resp: 18  Temp: 97.6 F (36.4 C)  SpO2: 100%   Filed Weights   04/23/21 1022  Weight: 175 lb 14.4 oz (79.8 kg)    LABORATORY DATA:  I have reviewed the data as listed CMP Latest Ref Rng & Units 04/02/2021 03/14/2021 02/19/2021  Glucose 70 - 99 mg/dL 214(H) 108(H) 99  BUN 6 - 20 mg/dL _0 Creatinine 0.44 - 1.00 mg/dL 0.72 0.77 0.78  Sodium 135 - 145 mmol/L 138 141 141  Potassium 3.5 - 5.1 mmol/L 4.2 4.4 4.2  Chloride 98 - 111 mmol/L 107 108 109  CO2 22 - 32 mmol/L _1 Calcium 8.9 - 10.3 mg/dL 8.9 9.1 9.0  Total Protein 6.5 - 8.1 g/dL 7.0 6.6 6.7  Total Bilirubin 0.3 - 1.2 mg/dL 0.3 0.2(L) 0.2(L)  Alkaline Phos 38 - 126 U/L 75 69 80  AST 15 - 41 U/L 40 49(H) 61(H)  ALT 0 - 44 U/L 49(H) 69(H) 100(H)    Lab Results  Component Value Date   WBC 5.3 04/23/2021   HGB 10.6 (L) 04/23/2021   HCT 33.0 (L) 04/23/2021   MCV 95.4 04/23/2021   PLT 266 04/23/2021   NEUTROABS 4.5 04/23/2021    ASSESSMENT & PLAN:  Malignant neoplasm of upper-outer quadrant of left breast in female, estrogen receptor negative ([facility]) 12/19/2020: Palpable left breast mass and left axillary tenderness.  Mammogram revealed conglomeration of masses 3.9 cm and an adjacent 0.7 cm mass and 2 axillary lymph nodes that were abnormal. Biopsy  revealed IDC with DCIS, grade 3, ER/PR negative, HER-2 +3+ by [facility]   12/24/2020: Breast MRI revealed mass or non-mass enhancement measuring 9.5 cm and 3 abnormal lymph nodes 01-02-21: Bone scan: No bone mets 01/01/2021: CT CAP: No distant metastatic disease   Treatment Plan: 1. Neoadjuvant chemotherapy with TCH Perjeta 6 cycles followed by Herceptin Perjeta maintenance versus Kadcyla maintenance (based on response to neoadjuvant chemo) for 1 year 2. Followed by breast conserving surgery and targeted lymph node surgery 3. Followed by adjuvant radiation therapy  -------------------------------------------------------------------------------------------------------------------------------- Current Treatment: Cycle 6 TCHP ECHO 01/02/21: EF 60-65%   Chemo toxicities: 1.  Severe bone pain related to Neulasta: Improved 2. diarrhea: Intermittently: Takes Imodium on and off 3.    Fatigue: She is having difficulty with walking long distances or climbing up stairs because of fatigue.  It is likely chemo related.  4.  Chemo induced anemia: Monitoring closely. Her hemoglobin today appears to be stable at 10.6 5.  Mild nausea: Improved with antinausea medication 6.  Bilateral lower extremity pains in the thighs and calves: Definitely related to Taxotere. 7.  Elevated LFTs: Monitoring closely   Monitoring closely for neuropathy. Breast MRI: Remarkable decrease in the enhancement.  Overall size is unchanged but there has been a significant difference.    Return to clinic in 3 weeks for Herceptin Perjeta. After surgery will make a decision regarding Herceptin Perjeta versus Kadcyla.  I briefly discussed with her about Kadcyla and its mechanism of action and its risks and benefits.   No orders of the defined types were placed in this encounter.  The patient has a good understanding of the overall plan. she agrees with it. she will call with any problems that may develop before the next visit  here.  Total time spent: 30 mins including face to face time and time spent for planning, charting and coordination of care  Rulon Eisenmenger, MD, MPH 04/23/2021  I, Thana Ates, am acting as scribe for Dr. Nicholas Lose.  I have reviewed the above documentation for accuracy and completeness, and I agree with the above.

## 2021-04-23 ENCOUNTER — Other Ambulatory Visit: Payer: Self-pay

## 2021-04-23 ENCOUNTER — Inpatient Hospital Stay: Payer: 59

## 2021-04-23 ENCOUNTER — Inpatient Hospital Stay: Payer: 59 | Attending: Hematology and Oncology

## 2021-04-23 ENCOUNTER — Inpatient Hospital Stay (HOSPITAL_BASED_OUTPATIENT_CLINIC_OR_DEPARTMENT_OTHER): Payer: 59 | Admitting: Hematology and Oncology

## 2021-04-23 DIAGNOSIS — C50412 Malignant neoplasm of upper-outer quadrant of left female breast: Secondary | ICD-10-CM

## 2021-04-23 DIAGNOSIS — Z5189 Encounter for other specified aftercare: Secondary | ICD-10-CM | POA: Insufficient documentation

## 2021-04-23 DIAGNOSIS — Z95828 Presence of other vascular implants and grafts: Secondary | ICD-10-CM

## 2021-04-23 DIAGNOSIS — Z5112 Encounter for antineoplastic immunotherapy: Secondary | ICD-10-CM | POA: Diagnosis present

## 2021-04-23 DIAGNOSIS — Z171 Estrogen receptor negative status [ER-]: Secondary | ICD-10-CM

## 2021-04-23 DIAGNOSIS — Z5111 Encounter for antineoplastic chemotherapy: Secondary | ICD-10-CM | POA: Insufficient documentation

## 2021-04-23 LAB — CMP (CANCER CENTER ONLY)
ALT: 16 U/L (ref 0–44)
AST: 16 U/L (ref 15–41)
Albumin: 3.9 g/dL (ref 3.5–5.0)
Alkaline Phosphatase: 56 U/L (ref 38–126)
Anion gap: 9 (ref 5–15)
BUN: 11 mg/dL (ref 6–20)
CO2: 25 mmol/L (ref 22–32)
Calcium: 10.1 mg/dL (ref 8.9–10.3)
Chloride: 106 mmol/L (ref 98–111)
Creatinine: 0.82 mg/dL (ref 0.44–1.00)
GFR, Estimated: >60 mL/min (ref >60)
Glucose, Bld: 197 mg/dL — ABNORMAL HIGH (ref 70–99)
Potassium: 4.3 mmol/L (ref 3.5–5.1)
Sodium: 140 mmol/L (ref 135–145)
Total Bilirubin: 0.3 mg/dL (ref 0.3–1.2)
Total Protein: 7 g/dL (ref 6.5–8.1)

## 2021-04-23 LAB — CBC WITH DIFFERENTIAL (CANCER CENTER ONLY)
Abs Immature Granulocytes: 0.02 10*3/uL (ref 0.00–0.07)
Basophils Absolute: 0 10*3/uL (ref 0.0–0.1)
Basophils Relative: 0 %
Eosinophils Absolute: 0 10*3/uL (ref 0.0–0.5)
Eosinophils Relative: 0 %
HCT: 33 % — ABNORMAL LOW (ref 36.0–46.0)
Hemoglobin: 10.6 g/dL — ABNORMAL LOW (ref 12.0–15.0)
Immature Granulocytes: 0 %
Lymphocytes Relative: 13 %
Lymphs Abs: 0.7 10*3/uL (ref 0.7–4.0)
MCH: 30.6 pg (ref 26.0–34.0)
MCHC: 32.1 g/dL (ref 30.0–36.0)
MCV: 95.4 fL (ref 80.0–100.0)
Monocytes Absolute: 0.1 10*3/uL (ref 0.1–1.0)
Monocytes Relative: 1 %
Neutro Abs: 4.5 10*3/uL (ref 1.7–7.7)
Neutrophils Relative %: 86 %
Platelet Count: 266 10*3/uL (ref 150–400)
RBC: 3.46 MIL/uL — ABNORMAL LOW (ref 3.87–5.11)
RDW: 18.2 % — ABNORMAL HIGH (ref 11.5–15.5)
WBC Count: 5.3 10*3/uL (ref 4.0–10.5)
nRBC: 0 % (ref 0.0–0.2)

## 2021-04-23 MED ORDER — DIPHENHYDRAMINE HCL 25 MG PO CAPS
ORAL_CAPSULE | ORAL | Status: AC
  Filled 2021-04-23: qty 1

## 2021-04-23 MED ORDER — PALONOSETRON HCL INJECTION 0.25 MG/5ML
0.2500 mg | Freq: Once | INTRAVENOUS | Status: AC
  Administered 2021-04-23: 0.25 mg via INTRAVENOUS

## 2021-04-23 MED ORDER — SODIUM CHLORIDE 0.9 % IV SOLN
700.0000 mg | Freq: Once | INTRAVENOUS | Status: AC
  Administered 2021-04-23: 700 mg via INTRAVENOUS
  Filled 2021-04-23: qty 70

## 2021-04-23 MED ORDER — DEXAMETHASONE SODIUM PHOSPHATE 100 MG/10ML IJ SOLN
10.0000 mg | Freq: Once | INTRAMUSCULAR | Status: AC
  Administered 2021-04-23: 10 mg via INTRAVENOUS
  Filled 2021-04-23: qty 10

## 2021-04-23 MED ORDER — HEPARIN SOD (PORK) LOCK FLUSH 100 UNIT/ML IV SOLN
500.0000 [IU] | Freq: Once | INTRAVENOUS | Status: DC | PRN
  Filled 2021-04-23: qty 5

## 2021-04-23 MED ORDER — ACETAMINOPHEN 325 MG PO TABS
ORAL_TABLET | ORAL | Status: AC
  Filled 2021-04-23: qty 2

## 2021-04-23 MED ORDER — FOSAPREPITANT DIMEGLUMINE INJECTION 150 MG
150.0000 mg | Freq: Once | INTRAVENOUS | Status: AC
  Administered 2021-04-23: 150 mg via INTRAVENOUS
  Filled 2021-04-23: qty 150

## 2021-04-23 MED ORDER — PALONOSETRON HCL INJECTION 0.25 MG/5ML
INTRAVENOUS | Status: AC
  Filled 2021-04-23: qty 5

## 2021-04-23 MED ORDER — SODIUM CHLORIDE 0.9 % IV SOLN
420.0000 mg | Freq: Once | INTRAVENOUS | Status: AC
  Administered 2021-04-23: 420 mg via INTRAVENOUS
  Filled 2021-04-23: qty 14

## 2021-04-23 MED ORDER — SODIUM CHLORIDE 0.9% FLUSH
10.0000 mL | Freq: Once | INTRAVENOUS | Status: AC
  Administered 2021-04-23: 10 mL
  Filled 2021-04-23: qty 10

## 2021-04-23 MED ORDER — SODIUM CHLORIDE 0.9% FLUSH
10.0000 mL | INTRAVENOUS | Status: DC | PRN
  Filled 2021-04-23: qty 10

## 2021-04-23 MED ORDER — SODIUM CHLORIDE 0.9 % IV SOLN
Freq: Once | INTRAVENOUS | Status: AC
  Filled 2021-04-23: qty 250

## 2021-04-23 MED ORDER — TRASTUZUMAB-ANNS CHEMO 150 MG IV SOLR
450.0000 mg | Freq: Once | INTRAVENOUS | Status: AC
Start: 2021-04-23 — End: 2021-04-23
  Administered 2021-04-23: 450 mg via INTRAVENOUS
  Filled 2021-04-23: qty 21.43

## 2021-04-23 MED ORDER — SODIUM CHLORIDE 0.9 % IV SOLN
75.0000 mg/m2 | Freq: Once | INTRAVENOUS | Status: AC
  Administered 2021-04-23: 140 mg via INTRAVENOUS
  Filled 2021-04-23: qty 14

## 2021-04-23 MED ORDER — DIPHENHYDRAMINE HCL 25 MG PO CAPS
25.0000 mg | ORAL_CAPSULE | Freq: Once | ORAL | Status: AC
  Administered 2021-04-23: 25 mg via ORAL

## 2021-04-23 MED ORDER — ACETAMINOPHEN 325 MG PO TABS
650.0000 mg | ORAL_TABLET | Freq: Once | ORAL | Status: AC
Start: 2021-04-23 — End: 2021-04-23
  Administered 2021-04-23: 650 mg via ORAL

## 2021-04-23 NOTE — Patient Instructions (Signed)
[facility]   Discharge Instructions: Thank you for choosing [facility] to provide your oncology and hematology care.   If you have a lab appointment with the [facility], please go directly to the [facility] and check in at the registration area.   Wear comfortable clothing and clothing appropriate for easy access to any Portacath or PICC line.   We strive to give you quality time with your provider. You may need to reschedule your appointment if you arrive late (15 or more minutes).  Arriving late affects you and other patients whose appointments are after yours.  Also, if you miss three or more appointments without notifying the office, you may be dismissed from the clinic at the provider's discretion.      For prescription refill requests, have your pharmacy contact our office and allow 72 hours for refills to be completed.    Today you received the following chemotherapy and/or immunotherapy agents: trastuzumab, pertuzumab, docetaxel, and carboplatin.      To help prevent nausea and vomiting after your treatment, we encourage you to take your nausea medication as directed.  BELOW ARE SYMPTOMS THAT SHOULD BE REPORTED IMMEDIATELY: *FEVER GREATER THAN 100.4 F (38 C) OR HIGHER *CHILLS OR SWEATING *NAUSEA AND VOMITING THAT IS NOT CONTROLLED WITH YOUR NAUSEA MEDICATION *UNUSUAL SHORTNESS OF BREATH *UNUSUAL BRUISING OR BLEEDING *URINARY PROBLEMS (pain or burning when urinating, or frequent urination) *BOWEL PROBLEMS (unusual diarrhea, constipation, pain near the anus) TENDERNESS IN MOUTH AND THROAT WITH OR WITHOUT PRESENCE OF ULCERS (sore throat, sores in mouth, or a toothache) UNUSUAL RASH, SWELLING OR PAIN  UNUSUAL VAGINAL DISCHARGE OR ITCHING   Items with * indicate a potential emergency and should be followed up as soon as possible or go to the Emergency Department if any problems should occur.  Please show the CHEMOTHERAPY ALERT  CARD or IMMUNOTHERAPY ALERT CARD at check-in to the Emergency Department and triage nurse.  Should you have questions after your visit or need to cancel or reschedule your appointment, please contact [facility]  Dept: [phone]  and follow the prompts.  Office hours are 8:00 a.m. to 4:30 p.m. Monday - Friday. Please note that voicemails left after 4:00 p.m. may not be returned until the following business day.  We are closed weekends and major holidays. You have access to a nurse at all times for urgent questions. Please call the main number to the clinic Dept: [phone] and follow the prompts.   For any non-urgent questions, you may also contact your provider using MyChart. We now offer e-Visits for anyone 66 and older to request care online for non-urgent symptoms. For details visit [email or web address].   Also download the MyChart app! Go to the app store, search "MyChart", open the app, select [facility], and log in with your MyChart username and password.  Due to Covid, a mask is required upon entering the hospital/clinic. If you do not have a mask, one will be given to you upon arrival. For doctor visits, patients may have 1 support person aged 65 or older with them. For treatment visits, patients cannot have anyone with them due to current Covid guidelines and our immunocompromised population.

## 2021-04-23 NOTE — Assessment & Plan Note (Signed)
12/19/2020: Palpable left breast mass and left axillary tenderness. Mammogram revealed conglomeration of masses 3.9 cm and an adjacent 0.7 cm mass and 2 axillary lymph nodes that were abnormal. Biopsy revealed IDC with DCIS, grade 3, ER/PR negative, HER-2 +3+ by [facility]  12/24/2020: Breast MRI revealed mass or non-mass enhancement measuring 9.5 cm and 3 abnormal lymph nodes 01-02-21: Bone scan: No bone mets 01/01/2021: CT CAP: No distant metastatic disease  Treatment Plan: 1. Neoadjuvant chemotherapy with TCH Perjeta 6 cycles followed by Herceptin Perjeta maintenance versus Kadcyla maintenance (based on response to neoadjuvant chemo) for 1 year 2. Followed by breast conserving surgeryand targeted lymph node surgery 3. Followed by adjuvant radiation therapy -------------------------------------------------------------------------------------------------------------------------------- Current Treatment: Cycle6TCHP ECHO 01/02/21: EF 60-65%  Chemo toxicities: 1.Severe bone pain related to Neulasta:Improved 2.diarrhea: Intermittently:Takes Imodium on and off 3.Fatigue: She is having difficulty with walking long distances or climbing up stairs because of fatigue. It is likely chemo related. Her hemoglobin today appears to be stable at 10.1. 4.Chemo induced anemia: Monitoring closely. 5.Mild nausea: Improved with antinausea medication 6.Bilateral lower extremity pains in the thighs and calves: Definitely related to Taxotere. 7.Elevated LFTs: Monitoring closely   Monitoring closely for neuropathy. Breast MRI has been ordered and follow-up with general surgery Return to clinic in 3 weeks for Herceptin Perjeta.

## 2021-04-24 ENCOUNTER — Other Ambulatory Visit: Payer: Self-pay | Admitting: General Surgery

## 2021-04-24 DIAGNOSIS — C50412 Malignant neoplasm of upper-outer quadrant of left female breast: Secondary | ICD-10-CM

## 2021-04-25 ENCOUNTER — Encounter: Payer: Self-pay | Admitting: *Deleted

## 2021-04-25 ENCOUNTER — Inpatient Hospital Stay: Payer: 59

## 2021-04-25 ENCOUNTER — Telehealth: Payer: Self-pay | Admitting: Hematology and Oncology

## 2021-04-25 ENCOUNTER — Other Ambulatory Visit: Payer: Self-pay

## 2021-04-25 VITALS — BP 117/85 | HR 87 | Temp 98.2°F | Resp 18

## 2021-04-25 DIAGNOSIS — C50412 Malignant neoplasm of upper-outer quadrant of left female breast: Secondary | ICD-10-CM

## 2021-04-25 DIAGNOSIS — Z5111 Encounter for antineoplastic chemotherapy: Secondary | ICD-10-CM | POA: Diagnosis not present

## 2021-04-25 MED ORDER — PEGFILGRASTIM-BMEZ 6 MG/0.6ML ~~LOC~~ SOSY
PREFILLED_SYRINGE | SUBCUTANEOUS | Status: AC
  Filled 2021-04-25: qty 0.6

## 2021-04-25 MED ORDER — PEGFILGRASTIM-BMEZ 6 MG/0.6ML ~~LOC~~ SOSY
6.0000 mg | PREFILLED_SYRINGE | Freq: Once | SUBCUTANEOUS | Status: AC
  Administered 2021-04-25: 6 mg via SUBCUTANEOUS

## 2021-04-25 NOTE — Telephone Encounter (Signed)
Scheduled per 7/12 los. Called and spoke with pt to confirm added appts

## 2021-04-25 NOTE — Patient Instructions (Signed)

## 2021-05-02 ENCOUNTER — Encounter: Payer: Self-pay | Admitting: *Deleted

## 2021-05-07 ENCOUNTER — Encounter: Payer: Self-pay | Admitting: *Deleted

## 2021-05-07 ENCOUNTER — Other Ambulatory Visit: Payer: Self-pay | Admitting: General Surgery

## 2021-05-07 DIAGNOSIS — C50412 Malignant neoplasm of upper-outer quadrant of left female breast: Secondary | ICD-10-CM

## 2021-05-08 ENCOUNTER — Telehealth: Payer: Self-pay | Admitting: Hematology and Oncology

## 2021-05-08 NOTE — Telephone Encounter (Signed)
Scheduled appt per 7/26 sch msg. Pt aware.

## 2021-05-14 ENCOUNTER — Ambulatory Visit: Payer: 59

## 2021-05-14 ENCOUNTER — Telehealth: Payer: Self-pay | Admitting: Hematology and Oncology

## 2021-05-14 ENCOUNTER — Other Ambulatory Visit: Payer: Self-pay

## 2021-05-14 ENCOUNTER — Inpatient Hospital Stay: Payer: 59

## 2021-05-14 ENCOUNTER — Other Ambulatory Visit: Payer: 59

## 2021-05-14 ENCOUNTER — Inpatient Hospital Stay: Payer: 59 | Attending: Hematology and Oncology

## 2021-05-14 ENCOUNTER — Other Ambulatory Visit: Payer: Self-pay | Admitting: Hematology and Oncology

## 2021-05-14 VITALS — BP 137/90 | HR 96 | Temp 98.2°F | Resp 17 | Wt 179.8 lb

## 2021-05-14 DIAGNOSIS — Z5112 Encounter for antineoplastic immunotherapy: Secondary | ICD-10-CM | POA: Insufficient documentation

## 2021-05-14 DIAGNOSIS — Z79899 Other long term (current) drug therapy: Secondary | ICD-10-CM | POA: Insufficient documentation

## 2021-05-14 DIAGNOSIS — C50412 Malignant neoplasm of upper-outer quadrant of left female breast: Secondary | ICD-10-CM | POA: Insufficient documentation

## 2021-05-14 DIAGNOSIS — Z171 Estrogen receptor negative status [ER-]: Secondary | ICD-10-CM

## 2021-05-14 DIAGNOSIS — Z95828 Presence of other vascular implants and grafts: Secondary | ICD-10-CM

## 2021-05-14 LAB — CMP (CANCER CENTER ONLY)
ALT: 24 U/L (ref 0–44)
AST: 27 U/L (ref 15–41)
Albumin: 3.9 g/dL (ref 3.5–5.0)
Alkaline Phosphatase: 53 U/L (ref 38–126)
Anion gap: 9 (ref 5–15)
BUN: 12 mg/dL (ref 6–20)
CO2: 25 mmol/L (ref 22–32)
Calcium: 9.3 mg/dL (ref 8.9–10.3)
Chloride: 106 mmol/L (ref 98–111)
Creatinine: 0.76 mg/dL (ref 0.44–1.00)
GFR, Estimated: >60 mL/min (ref >60)
Glucose, Bld: 93 mg/dL (ref 70–99)
Potassium: 3.9 mmol/L (ref 3.5–5.1)
Sodium: 140 mmol/L (ref 135–145)
Total Bilirubin: 0.3 mg/dL (ref 0.3–1.2)
Total Protein: 6.5 g/dL (ref 6.5–8.1)

## 2021-05-14 LAB — CBC WITH DIFFERENTIAL (CANCER CENTER ONLY)
Abs Immature Granulocytes: 0.01 10*3/uL (ref 0.00–0.07)
Basophils Absolute: 0 10*3/uL (ref 0.0–0.1)
Basophils Relative: 0 %
Eosinophils Absolute: 0 10*3/uL (ref 0.0–0.5)
Eosinophils Relative: 0 %
HCT: 32.1 % — ABNORMAL LOW (ref 36.0–46.0)
Hemoglobin: 10.2 g/dL — ABNORMAL LOW (ref 12.0–15.0)
Immature Granulocytes: 0 %
Lymphocytes Relative: 31 %
Lymphs Abs: 1.7 10*3/uL (ref 0.7–4.0)
MCH: 30.5 pg (ref 26.0–34.0)
MCHC: 31.8 g/dL (ref 30.0–36.0)
MCV: 96.1 fL (ref 80.0–100.0)
Monocytes Absolute: 0.5 10*3/uL (ref 0.1–1.0)
Monocytes Relative: 10 %
Neutro Abs: 3.2 10*3/uL (ref 1.7–7.7)
Neutrophils Relative %: 59 %
Platelet Count: 305 10*3/uL (ref 150–400)
RBC: 3.34 MIL/uL — ABNORMAL LOW (ref 3.87–5.11)
RDW: 17 % — ABNORMAL HIGH (ref 11.5–15.5)
WBC Count: 5.5 10*3/uL (ref 4.0–10.5)
nRBC: 0 % (ref 0.0–0.2)

## 2021-05-14 MED ORDER — DIPHENHYDRAMINE HCL 25 MG PO CAPS
ORAL_CAPSULE | ORAL | Status: AC
  Filled 2021-05-14: qty 1

## 2021-05-14 MED ORDER — SODIUM CHLORIDE 0.9 % IV SOLN
420.0000 mg | Freq: Once | INTRAVENOUS | Status: AC
  Administered 2021-05-14: 420 mg via INTRAVENOUS
  Filled 2021-05-14: qty 14

## 2021-05-14 MED ORDER — SODIUM CHLORIDE 0.9 % IV SOLN
Freq: Once | INTRAVENOUS | Status: AC
  Filled 2021-05-14: qty 250

## 2021-05-14 MED ORDER — ACETAMINOPHEN 325 MG PO TABS
ORAL_TABLET | ORAL | Status: AC
  Filled 2021-05-14: qty 2

## 2021-05-14 MED ORDER — DIPHENHYDRAMINE HCL 25 MG PO CAPS
25.0000 mg | ORAL_CAPSULE | Freq: Once | ORAL | Status: AC
  Administered 2021-05-14: 25 mg via ORAL

## 2021-05-14 MED ORDER — TRASTUZUMAB-ANNS CHEMO 150 MG IV SOLR
450.0000 mg | Freq: Once | INTRAVENOUS | Status: AC
  Administered 2021-05-14: 450 mg via INTRAVENOUS
  Filled 2021-05-14: qty 21.43

## 2021-05-14 MED ORDER — SODIUM CHLORIDE 0.9% FLUSH
10.0000 mL | Freq: Once | INTRAVENOUS | Status: AC
  Administered 2021-05-14: 10 mL
  Filled 2021-05-14: qty 10

## 2021-05-14 MED ORDER — ACETAMINOPHEN 325 MG PO TABS
650.0000 mg | ORAL_TABLET | Freq: Once | ORAL | Status: AC
  Administered 2021-05-14: 650 mg via ORAL

## 2021-05-14 MED ORDER — HEPARIN SOD (PORK) LOCK FLUSH 100 UNIT/ML IV SOLN
500.0000 [IU] | Freq: Once | INTRAVENOUS | Status: AC | PRN
  Administered 2021-05-14: 500 [IU]
  Filled 2021-05-14: qty 5

## 2021-05-14 MED ORDER — SODIUM CHLORIDE 0.9% FLUSH
10.0000 mL | INTRAVENOUS | Status: DC | PRN
  Administered 2021-05-14: 10 mL
  Filled 2021-05-14: qty 10

## 2021-05-14 NOTE — Telephone Encounter (Signed)
Scheduled per 8/2 secure chat per RN. Called and confirmed added appt with pt

## 2021-05-14 NOTE — Patient Instructions (Addendum)
[facility]  Discharge Instructions: Thank you for choosing [facility] to provide your oncology and hematology care.   If you have a lab appointment with the [facility], please go directly to the [facility] and check in at the registration area.   Wear comfortable clothing and clothing appropriate for easy access to any Portacath or PICC line.   We strive to give you quality time with your provider. You may need to reschedule your appointment if you arrive late (15 or more minutes).  Arriving late affects you and other patients whose appointments are after yours.  Also, if you miss three or more appointments without notifying the office, you may be dismissed from the clinic at the provider's discretion.      For prescription refill requests, have your pharmacy contact our office and allow 72 hours for refills to be completed.    Today you received the following chemotherapy and/or immunotherapy agents Trastuzumab-anns (Kanj[place]i) and Pertuzumab (Perjeta).      To help prevent nausea and vomiting after your treatment, we encourage you to take your nausea medication as directed.  BELOW ARE SYMPTOMS THAT SHOULD BE REPORTED IMMEDIATELY: *FEVER GREATER THAN 100.4 F (38 C) OR HIGHER *CHILLS OR SWEATING *NAUSEA AND VOMITING THAT IS NOT CONTROLLED WITH YOUR NAUSEA MEDICATION *UNUSUAL SHORTNESS OF BREATH *UNUSUAL BRUISING OR BLEEDING *URINARY PROBLEMS (pain or burning when urinating, or frequent urination) *BOWEL PROBLEMS (unusual diarrhea, constipation, pain near the anus) TENDERNESS IN MOUTH AND THROAT WITH OR WITHOUT PRESENCE OF ULCERS (sore throat, sores in mouth, or a toothache) UNUSUAL RASH, SWELLING OR PAIN  UNUSUAL VAGINAL DISCHARGE OR ITCHING   Items with * indicate a potential emergency and should be followed up as soon as possible or go to the Emergency Department if any problems should occur.  Please show the CHEMOTHERAPY ALERT CARD  or IMMUNOTHERAPY ALERT CARD at check-in to the Emergency Department and triage nurse.  Should you have questions after your visit or need to cancel or reschedule your appointment, please contact [facility]  Dept: [phone]  and follow the prompts.  Office hours are 8:00 a.m. to 4:30 p.m. Monday - Friday. Please note that voicemails left after 4:00 p.m. may not be returned until the following business day.  We are closed weekends and major holidays. You have access to a nurse at all times for urgent questions. Please call the main number to the clinic Dept: [phone] and follow the prompts.   For any non-urgent questions, you may also contact your provider using MyChart. We now offer e-Visits for anyone 61 and older to request care online for non-urgent symptoms. For details visit [email or web address].   Also download the MyChart app! Go to the app store, search "MyChart", open the app, select [facility], and log in with your MyChart username and password.  Due to Covid, a mask is required upon entering the hospital/clinic. If you do not have a mask, one will be given to you upon arrival. For doctor visits, patients may have 1 support person aged 34 or older with them. For treatment visits, patients cannot have anyone with them due to current Covid guidelines and our immunocompromised population.

## 2021-05-15 NOTE — Progress Notes (Signed)
STD forms successfully faxed to [phone] per patient request.

## 2021-05-23 NOTE — H&P (Signed)
Subjective:     Patient ID: Maria Kennedy is a 50 y.o. female.   HPI   Patient of Drs. Donne Hazel and [place] here for consultation breast reconstruction. Presented with palpable left breast mass. Diagnostic MMG/US showed a conglomeration of masses in the left breast at the 2 o'clock position 3-4cmfn spanning 3.9cm, with a 0.7cm mass at the 2 o'clock position 6cmfn, and two abnormal axillary LN. Biopsy showed invasive and in situ ductal carcinoma, HER-2 +, ER/PR -.    MRI demonstrated 9.5 x 5.5 x 4.5 cm biopsy-proven LEFT breast malignancy. Three abnormal left axillary LN prsent, one with biopsy proven metastatic disease.   Staging scans negative for distant disease.   Genetics negative   Completed neoadjuvant chemotherapy 7.12.22. Final MRI showed persistent NME left UOQ, similar in distribution compared to  previous exam, with less enhancement. Resolved left axillary LN enlargement.   Plan left mastectomy. Not candidate for NSM per Dr. Donne Hazel as she presented with nipple retraction. She will receive adjuvant radiation.    Current 38 B happy with this. Wt stable.   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 Lives with spouse and 46 and 73 yo children.    Review of Systems Remainder 12 point review negative    Objective:   Physical Exam Cardiovascular:     Rate and Rhythm: Normal rate and regular rhythm.     Heart sounds: Normal heart sounds.  Pulmonary:     Effort: Pulmonary effort is normal.     Breath sounds: Normal breath sounds.  Chest:  Breasts:     Right: No axillary adenopathy.     Left: No axillary adenopathy.      Lymphadenopathy:     Upper Body:     Right upper body: No axillary adenopathy.     Left upper body: No axillary adenopathy.     Breasts: Grade 3 ptosis bilateral Sn to nipple R 25.5 L 25 cm BW R 19 L 19 cm CW 14 cm Nipple to IMF R 10 L 10 cm      Assessment:     Left breast  cancer UOQ ER- metastasized to LN Neoadjuvant chemotherapy    Plan:     Plan left mastectomy with immediate tissue expander acellular dermis reconstruction.   Reviewed incisions, drains, OR length, hospital stay and recovery, limitations. Discussed process of expansion and implant based risks including rupture, imaging surveillance for silicone implants, infection requiring surgery or removal, contracture. Reviewed risks mastectomy flap necrosis requiring additional surgery.   Discussed use of acellular dermis in reconstruction, cadaveric source, incorporation over several weeks, risk that if has seroma or infection can act as additional nidus for infection if not incorporated.    Discussed prepectoral vs sub pectoral reconstruction. Discussed with patient and benefit of this is no animation deformity, may be less pain. Risk may be more visible rippling over upper poles, greater need of ADM. Reviewed pre pectoral would require larger amount acellular dermis, more drains. Discussed any type reconstruction also risks long term displacement implant and visible rippling. If prepectoral counseled I would recommend she be comfortable with silicone implants as more options that have less rippling. She agrees to prepectoral placement.   Reviewed reconstruction will be asensate and not stimulate. Reviewed additional risks including but not limited to risks mastectomy flap necrosis requiring additional surgery, seroma, hematoma, asymmetry, need to additional procedures, fat necrosis, DVT/PE, damage to adjacent structures, cardiopulmonary complications.   Reviewed future surgery  dependent on adjuvant treatments. This includes radiation- discussed this significantly increases risk reconstruction including wound healing problems capsular contracture. Options would be to delay reconstruction and pursue LD + TE or autologous. Alternative would be placement expander at time of mastectomy- the benefit of this would be  to "have something there." However this would then mean expander would be in place for several months (approximately 3-6 months from end of radiation) before continuing reconstruction process. At that time could do implant exchange alone, implant exchange with LD flap for radiated chest, or coversion to autologous.    Rx for [place] given. Drain teaching completed. Rx for oxycodone robaxin and Bactrim given

## 2021-05-24 ENCOUNTER — Encounter: Payer: Self-pay | Admitting: Hematology and Oncology

## 2021-05-27 ENCOUNTER — Other Ambulatory Visit: Payer: Self-pay

## 2021-05-27 ENCOUNTER — Encounter (HOSPITAL_BASED_OUTPATIENT_CLINIC_OR_DEPARTMENT_OTHER): Payer: Self-pay | Admitting: General Surgery

## 2021-05-27 ENCOUNTER — Encounter: Payer: Self-pay | Admitting: Hematology and Oncology

## 2021-05-30 ENCOUNTER — Ambulatory Visit: Payer: 59 | Admitting: Hematology and Oncology

## 2021-06-03 ENCOUNTER — Ambulatory Visit
Admission: RE | Admit: 2021-06-03 | Discharge: 2021-06-03 | Disposition: A | Discharge status: Home / Self Care | Payer: PRIVATE HEALTH INSURANCE | Source: Ambulatory Visit | Attending: General Surgery | Admitting: General Surgery

## 2021-06-03 ENCOUNTER — Encounter: Payer: Self-pay | Admitting: Hematology and Oncology

## 2021-06-03 ENCOUNTER — Other Ambulatory Visit: Payer: Self-pay | Admitting: General Surgery

## 2021-06-03 ENCOUNTER — Other Ambulatory Visit: Payer: Self-pay

## 2021-06-03 DIAGNOSIS — C50412 Malignant neoplasm of upper-outer quadrant of left female breast: Secondary | ICD-10-CM

## 2021-06-03 DIAGNOSIS — Z171 Estrogen receptor negative status [ER-]: Secondary | ICD-10-CM

## 2021-06-03 MED ORDER — ENSURE PRE-SURGERY PO LIQD: 296.0000 mL | Freq: Once | ORAL | Status: DC

## 2021-06-03 NOTE — Progress Notes (Signed)

## 2021-06-04 ENCOUNTER — Encounter (HOSPITAL_BASED_OUTPATIENT_CLINIC_OR_DEPARTMENT_OTHER): Payer: Self-pay | Admitting: General Surgery

## 2021-06-04 ENCOUNTER — Ambulatory Visit (HOSPITAL_COMMUNITY)
Admission: RE | Admit: 2021-06-04 | Discharge: 2021-06-04 | Disposition: A | Discharge status: Home / Self Care | Payer: 59 | Source: Ambulatory Visit | Attending: General Surgery | Admitting: General Surgery

## 2021-06-04 ENCOUNTER — Encounter (HOSPITAL_BASED_OUTPATIENT_CLINIC_OR_DEPARTMENT_OTHER)
Admission: RE | Disposition: A | Discharge status: Home / Self Care | Payer: Self-pay | Source: Home / Self Care | Attending: General Surgery

## 2021-06-04 ENCOUNTER — Other Ambulatory Visit: Payer: Self-pay

## 2021-06-04 ENCOUNTER — Ambulatory Visit
Admission: RE | Admit: 2021-06-04 | Discharge: 2021-06-04 | Disposition: A | Discharge status: Home / Self Care | Payer: PRIVATE HEALTH INSURANCE | Source: Ambulatory Visit | Attending: General Surgery | Admitting: General Surgery

## 2021-06-04 ENCOUNTER — Ambulatory Visit (HOSPITAL_BASED_OUTPATIENT_CLINIC_OR_DEPARTMENT_OTHER): Payer: 59 | Admitting: Certified Registered"

## 2021-06-04 ENCOUNTER — Observation Stay (HOSPITAL_BASED_OUTPATIENT_CLINIC_OR_DEPARTMENT_OTHER)
Admission: RE | Admit: 2021-06-04 | Discharge: 2021-06-05 | Disposition: A | Discharge status: Home / Self Care | Payer: 59 | Attending: General Surgery | Admitting: General Surgery

## 2021-06-04 DIAGNOSIS — N62 Hypertrophy of breast: Secondary | ICD-10-CM | POA: Diagnosis not present

## 2021-06-04 DIAGNOSIS — Z79899 Other long term (current) drug therapy: Secondary | ICD-10-CM | POA: Insufficient documentation

## 2021-06-04 DIAGNOSIS — C50912 Malignant neoplasm of unspecified site of left female breast: Secondary | ICD-10-CM | POA: Diagnosis present

## 2021-06-04 DIAGNOSIS — Z91040 Latex allergy status: Secondary | ICD-10-CM | POA: Insufficient documentation

## 2021-06-04 DIAGNOSIS — J45909 Unspecified asthma, uncomplicated: Secondary | ICD-10-CM | POA: Insufficient documentation

## 2021-06-04 DIAGNOSIS — Z9012 Acquired absence of left breast and nipple: Secondary | ICD-10-CM

## 2021-06-04 DIAGNOSIS — N6022 Fibroadenosis of left breast: Principal | ICD-10-CM | POA: Insufficient documentation

## 2021-06-04 DIAGNOSIS — C50412 Malignant neoplasm of upper-outer quadrant of left female breast: Secondary | ICD-10-CM

## 2021-06-04 HISTORY — PX: MASTECTOMY WITH RADIOACTIVE SEED GUIDED EXCISION AND AXILLARY SENTINEL LYMPH NODE BIOPSY: SHX6736

## 2021-06-04 HISTORY — DX: Malignant (primary) neoplasm, unspecified: C80.1

## 2021-06-04 HISTORY — PX: BREAST RECONSTRUCTION WITH PLACEMENT OF TISSUE EXPANDER AND ALLODERM: SHX6805

## 2021-06-04 LAB — POCT PREGNANCY, URINE: Preg Test, Ur: NEGATIVE

## 2021-06-04 SURGERY — MASTECTOMY WITH RADIOACTIVE SEED GUIDED EXCISION AND AXILLARY SENTINEL LYMPH NODE BIOPSY
Anesthesia: General | Site: Breast | Laterality: Left

## 2021-06-04 MED ORDER — CEFAZOLIN SODIUM-DEXTROSE 2-4 GM/100ML-% IV SOLN
2.0000 g | INTRAVENOUS | Status: AC
  Administered 2021-06-04: 2 g via INTRAVENOUS

## 2021-06-04 MED ORDER — LACTATED RINGERS IV SOLN: INTRAVENOUS | Status: DC

## 2021-06-04 MED ORDER — ONDANSETRON HCL 4 MG/2ML IJ SOLN: 4.0000 mg | Freq: Four times a day (QID) | INTRAMUSCULAR | Status: DC | PRN

## 2021-06-04 MED ORDER — ACETAMINOPHEN 500 MG PO TABS
1000.0000 mg | ORAL_TABLET | ORAL | Status: AC
  Administered 2021-06-04: 1000 mg via ORAL

## 2021-06-04 MED ORDER — HYDROMORPHONE HCL 1 MG/ML IJ SOLN
INTRAMUSCULAR | Status: AC
  Filled 2021-06-04: qty 0.5

## 2021-06-04 MED ORDER — PROPOFOL 500 MG/50ML IV EMUL
INTRAVENOUS | Status: AC
  Filled 2021-06-04: qty 50

## 2021-06-04 MED ORDER — AMISULPRIDE (ANTIEMETIC) 5 MG/2ML IV SOLN: 10.0000 mg | Freq: Once | INTRAVENOUS | Status: DC | PRN

## 2021-06-04 MED ORDER — PROPOFOL 10 MG/ML IV BOLUS
INTRAVENOUS | Status: DC | PRN
  Administered 2021-06-04: 150 mg via INTRAVENOUS

## 2021-06-04 MED ORDER — MIDAZOLAM HCL 2 MG/2ML IJ SOLN
INTRAMUSCULAR | Status: AC
  Filled 2021-06-04: qty 2

## 2021-06-04 MED ORDER — FENTANYL CITRATE (PF) 100 MCG/2ML IJ SOLN
100.0000 ug | Freq: Once | INTRAMUSCULAR | Status: AC
  Administered 2021-06-04: 100 ug via INTRAVENOUS

## 2021-06-04 MED ORDER — KETOROLAC TROMETHAMINE 15 MG/ML IJ SOLN
15.0000 mg | INTRAMUSCULAR | Status: AC
  Administered 2021-06-04: 15 mg via INTRAVENOUS

## 2021-06-04 MED ORDER — OXYCODONE HCL 5 MG PO TABS: 5.0000 mg | ORAL_TABLET | Freq: Once | ORAL | Status: DC | PRN

## 2021-06-04 MED ORDER — BUPIVACAINE HCL (PF) 0.25 % IJ SOLN
INTRAMUSCULAR | Status: AC
  Filled 2021-06-04: qty 30

## 2021-06-04 MED ORDER — SODIUM CHLORIDE 0.9 % IV SOLN
INTRAVENOUS | Status: DC | PRN
  Administered 2021-06-04: 500 mL

## 2021-06-04 MED ORDER — LIDOCAINE HCL (CARDIAC) PF 100 MG/5ML IV SOSY
PREFILLED_SYRINGE | INTRAVENOUS | Status: DC | PRN
Start: 2021-06-04 — End: 2021-06-04
  Administered 2021-06-04: 80 mg via INTRAVENOUS

## 2021-06-04 MED ORDER — ONDANSETRON HCL 4 MG/2ML IJ SOLN
INTRAMUSCULAR | Status: AC
  Filled 2021-06-04: qty 2

## 2021-06-04 MED ORDER — SIMETHICONE 80 MG PO CHEW: 40.0000 mg | CHEWABLE_TABLET | Freq: Four times a day (QID) | ORAL | Status: DC | PRN

## 2021-06-04 MED ORDER — POVIDONE-IODINE 10 % EX SOLN
CUTANEOUS | Status: DC | PRN
  Administered 2021-06-04: 1 via TOPICAL

## 2021-06-04 MED ORDER — ONDANSETRON 4 MG PO TBDP: 4.0000 mg | ORAL_TABLET | Freq: Four times a day (QID) | ORAL | Status: DC | PRN

## 2021-06-04 MED ORDER — ACETAMINOPHEN 500 MG PO TABS
ORAL_TABLET | ORAL | Status: AC
  Filled 2021-06-04: qty 2

## 2021-06-04 MED ORDER — SODIUM CHLORIDE 0.9 % IV SOLN: INTRAVENOUS | Status: DC

## 2021-06-04 MED ORDER — FENTANYL CITRATE (PF) 100 MCG/2ML IJ SOLN
INTRAMUSCULAR | Status: AC
  Filled 2021-06-04: qty 2

## 2021-06-04 MED ORDER — ROCURONIUM BROMIDE 100 MG/10ML IV SOLN
INTRAVENOUS | Status: DC | PRN
  Administered 2021-06-04: 80 mg via INTRAVENOUS

## 2021-06-04 MED ORDER — DEXAMETHASONE SODIUM PHOSPHATE 4 MG/ML IJ SOLN
INTRAMUSCULAR | Status: DC | PRN
  Administered 2021-06-04: 10 mg via INTRAVENOUS

## 2021-06-04 MED ORDER — KETOROLAC TROMETHAMINE 15 MG/ML IJ SOLN
INTRAMUSCULAR | Status: AC
  Filled 2021-06-04: qty 1

## 2021-06-04 MED ORDER — MIDAZOLAM HCL 2 MG/2ML IJ SOLN
2.0000 mg | Freq: Once | INTRAMUSCULAR | Status: AC
  Administered 2021-06-04: 2 mg via INTRAVENOUS

## 2021-06-04 MED ORDER — METHOCARBAMOL 500 MG PO TABS
500.0000 mg | ORAL_TABLET | Freq: Four times a day (QID) | ORAL | Status: DC | PRN
  Administered 2021-06-04 – 2021-06-05 (×3): 500 mg via ORAL
  Filled 2021-06-04 (×3): qty 1

## 2021-06-04 MED ORDER — PHENYLEPHRINE HCL (PRESSORS) 10 MG/ML IV SOLN
INTRAVENOUS | Status: AC
  Filled 2021-06-04: qty 1

## 2021-06-04 MED ORDER — CEFAZOLIN SODIUM-DEXTROSE 2-4 GM/100ML-% IV SOLN
2.0000 g | Freq: Three times a day (TID) | INTRAVENOUS | Status: DC
  Administered 2021-06-04 – 2021-06-05 (×2): 2 g via INTRAVENOUS
  Filled 2021-06-04 (×2): qty 100

## 2021-06-04 MED ORDER — OXYCODONE HCL 5 MG PO TABS
5.0000 mg | ORAL_TABLET | ORAL | Status: DC | PRN
  Administered 2021-06-04 – 2021-06-05 (×3): 5 mg via ORAL
  Filled 2021-06-04 (×3): qty 1

## 2021-06-04 MED ORDER — MORPHINE SULFATE (PF) 4 MG/ML IV SOLN
2.0000 mg | INTRAVENOUS | Status: DC | PRN
Start: 2021-06-04 — End: 2021-06-05

## 2021-06-04 MED ORDER — OXYCODONE HCL 5 MG/5ML PO SOLN: 5.0000 mg | Freq: Once | ORAL | Status: DC | PRN

## 2021-06-04 MED ORDER — ROCURONIUM BROMIDE 10 MG/ML (PF) SYRINGE
PREFILLED_SYRINGE | INTRAVENOUS | Status: AC
  Filled 2021-06-04: qty 10

## 2021-06-04 MED ORDER — DEXAMETHASONE SODIUM PHOSPHATE 10 MG/ML IJ SOLN
INTRAMUSCULAR | Status: AC
  Filled 2021-06-04: qty 1

## 2021-06-04 MED ORDER — HYDROMORPHONE HCL 1 MG/ML IJ SOLN
0.2500 mg | INTRAMUSCULAR | Status: DC | PRN
  Administered 2021-06-04 (×2): 0.5 mg via INTRAVENOUS

## 2021-06-04 MED ORDER — PROPOFOL 500 MG/50ML IV EMUL
INTRAVENOUS | Status: DC | PRN
  Administered 2021-06-04: 25 ug/kg/min via INTRAVENOUS

## 2021-06-04 MED ORDER — CEFAZOLIN SODIUM-DEXTROSE 2-4 GM/100ML-% IV SOLN
INTRAVENOUS | Status: AC
  Filled 2021-06-04: qty 100

## 2021-06-04 MED ORDER — ONDANSETRON HCL 4 MG/2ML IJ SOLN
INTRAMUSCULAR | Status: DC | PRN
  Administered 2021-06-04: 4 mg via INTRAVENOUS

## 2021-06-04 MED ORDER — ACETAMINOPHEN 500 MG PO TABS
1000.0000 mg | ORAL_TABLET | Freq: Four times a day (QID) | ORAL | Status: DC
  Administered 2021-06-04 – 2021-06-05 (×3): 1000 mg via ORAL
  Filled 2021-06-04 (×3): qty 2

## 2021-06-04 MED ORDER — ROPIVACAINE HCL 5 MG/ML IJ SOLN
INTRAMUSCULAR | Status: DC | PRN
  Administered 2021-06-04: 30 mL via PERINEURAL

## 2021-06-04 MED ORDER — PHENYLEPHRINE 40 MCG/ML (10ML) SYRINGE FOR IV PUSH (FOR BLOOD PRESSURE SUPPORT)
PREFILLED_SYRINGE | INTRAVENOUS | Status: DC | PRN
  Administered 2021-06-04: 120 ug via INTRAVENOUS

## 2021-06-04 MED ORDER — 0.9 % SODIUM CHLORIDE (POUR BTL) OPTIME
TOPICAL | Status: DC | PRN
  Administered 2021-06-04: 400 mL

## 2021-06-04 MED ORDER — PROMETHAZINE HCL 25 MG/ML IJ SOLN: 6.2500 mg | INTRAMUSCULAR | Status: DC | PRN

## 2021-06-04 MED ORDER — PHENYLEPHRINE HCL-NACL 20-0.9 MG/250ML-% IV SOLN
INTRAVENOUS | Status: DC | PRN
  Administered 2021-06-04: 50 ug/min via INTRAVENOUS

## 2021-06-04 MED ORDER — SUGAMMADEX SODIUM 200 MG/2ML IV SOLN
INTRAVENOUS | Status: DC | PRN
  Administered 2021-06-04: 200 mg via INTRAVENOUS

## 2021-06-04 MED ORDER — ALBUTEROL SULFATE HFA 108 (90 BASE) MCG/ACT IN AERS: 2.0000 | INHALATION_SPRAY | Freq: Four times a day (QID) | RESPIRATORY_TRACT | Status: DC | PRN

## 2021-06-04 MED ORDER — MEPERIDINE HCL 25 MG/ML IJ SOLN: 6.2500 mg | INTRAMUSCULAR | Status: DC | PRN

## 2021-06-04 MED ORDER — TECHNETIUM TC 99M TILMANOCEPT KIT
1.0000 | PACK | Freq: Once | INTRAVENOUS | Status: AC | PRN
  Administered 2021-06-04: 1 via INTRADERMAL

## 2021-06-04 MED ORDER — FENTANYL CITRATE (PF) 100 MCG/2ML IJ SOLN
INTRAMUSCULAR | Status: DC | PRN
  Administered 2021-06-04: 50 ug via INTRAVENOUS

## 2021-06-04 SURGICAL SUPPLY — 103 items
ALLOGRAFT PERF 16X20 1.6+/-0.4 (Tissue) ×1 IMPLANT
APPLIER CLIP 11 MED OPEN (CLIP)
APPLIER CLIP 9.375 MED OPEN (MISCELLANEOUS) ×2
BAG DECANTER FOR FLEXI CONT (MISCELLANEOUS) ×2 IMPLANT
BENZOIN TINCTURE PRP APPL 2/3 (GAUZE/BANDAGES/DRESSINGS) IMPLANT
BINDER BREAST LRG (GAUZE/BANDAGES/DRESSINGS) IMPLANT
BINDER BREAST MEDIUM (GAUZE/BANDAGES/DRESSINGS) IMPLANT
BINDER BREAST XLRG (GAUZE/BANDAGES/DRESSINGS) ×1 IMPLANT
BINDER BREAST XXLRG (GAUZE/BANDAGES/DRESSINGS) IMPLANT
BIOPATCH RED 1 DISK 7.0 (GAUZE/BANDAGES/DRESSINGS) ×2 IMPLANT
BLADE CLIPPER SURG (BLADE) IMPLANT
BLADE HEX COATED 2.75 (ELECTRODE) IMPLANT
BLADE SURG 10 STRL SS (BLADE) ×2 IMPLANT
BLADE SURG 15 STRL LF DISP TIS (BLADE) ×1 IMPLANT
BLADE SURG 15 STRL SS (BLADE) ×1
BNDG GAUZE ELAST 4 BULKY (GAUZE/BANDAGES/DRESSINGS) ×2 IMPLANT
CANISTER SUCT 1200ML W/VALVE (MISCELLANEOUS) ×2 IMPLANT
CHLORAPREP W/TINT 26 (MISCELLANEOUS) ×3 IMPLANT
CLIP APPLIE 11 MED OPEN (CLIP) IMPLANT
CLIP APPLIE 9.375 MED OPEN (MISCELLANEOUS) IMPLANT
CLIP TI WIDE RED SMALL 6 (CLIP) IMPLANT
COUNTER NEEDLE 1200 MAGNETIC (NEEDLE) IMPLANT
COVER BACK TABLE 60X90IN (DRAPES) ×2 IMPLANT
COVER MAYO STAND STRL (DRAPES) ×3 IMPLANT
COVER PROBE W GEL 5X96 (DRAPES) ×3 IMPLANT
DECANTER SPIKE VIAL GLASS SM (MISCELLANEOUS) IMPLANT
DERMABOND ADVANCED (GAUZE/BANDAGES/DRESSINGS) ×2
DERMABOND ADVANCED .7 DNX12 (GAUZE/BANDAGES/DRESSINGS) ×1 IMPLANT
DRAIN CHANNEL 15F RND FF W/TCR (WOUND CARE) ×1 IMPLANT
DRAIN CHANNEL 19F RND (DRAIN) ×2 IMPLANT
DRAPE INCISE IOBAN 66X45 STRL (DRAPES) ×1 IMPLANT
DRAPE TOP ARMCOVERS (MISCELLANEOUS) ×2 IMPLANT
DRAPE U-SHAPE 76X120 STRL (DRAPES) ×3 IMPLANT
DRAPE UTILITY XL STRL (DRAPES) ×2 IMPLANT
DRSG PAD ABDOMINAL 8X10 ST (GAUZE/BANDAGES/DRESSINGS) ×4 IMPLANT
DRSG TEGADERM 2-3/8X2-3/4 SM (GAUZE/BANDAGES/DRESSINGS) ×1 IMPLANT
DRSG TEGADERM 4X10 (GAUZE/BANDAGES/DRESSINGS) ×2 IMPLANT
DRSG TEGADERM 4X4.75 (GAUZE/BANDAGES/DRESSINGS) IMPLANT
ELECT BLADE 4.0 EZ CLEAN MEGAD (MISCELLANEOUS) ×2
ELECT COATED BLADE 2.86 ST (ELECTRODE) ×2 IMPLANT
ELECT REM PT RETURN 9FT ADLT (ELECTROSURGICAL) ×2
ELECTRODE BLDE 4.0 EZ CLN MEGD (MISCELLANEOUS) ×1 IMPLANT
ELECTRODE REM PT RTRN 9FT ADLT (ELECTROSURGICAL) ×1 IMPLANT
EVACUATOR SILICONE 100CC (DRAIN) ×3 IMPLANT
EXPANDER TISSUE FV FOURTE 500 (Prosthesis & Implant Plastic) IMPLANT
GAUZE SPONGE 4X4 12PLY STRL LF (GAUZE/BANDAGES/DRESSINGS) IMPLANT
GLOVE SURG ENC MOIS LTX SZ6.5 (GLOVE) IMPLANT
GLOVE SURG POLYISO LF SZ6.5 (GLOVE) ×2 IMPLANT
GLOVE SURG POLYISO LF SZ7 (GLOVE) ×1 IMPLANT
GLOVE SURG UNDER POLY LF SZ6.5 (GLOVE) ×1 IMPLANT
GLOVE SURG UNDER POLY LF SZ7 (GLOVE) ×1 IMPLANT
GLOVE SURG UNDER POLY LF SZ7.5 (GLOVE) ×2 IMPLANT
GOWN STRL REUS W/ TWL LRG LVL3 (GOWN DISPOSABLE) ×3 IMPLANT
GOWN STRL REUS W/ TWL XL LVL3 (GOWN DISPOSABLE) IMPLANT
GOWN STRL REUS W/TWL LRG LVL3 (GOWN DISPOSABLE) ×3
GOWN STRL REUS W/TWL XL LVL3 (GOWN DISPOSABLE) ×1
HEMOSTAT ARISTA ABSORB 3G PWDR (HEMOSTASIS) IMPLANT
KIT FILL SYSTEM UNIVERSAL (SET/KITS/TRAYS/PACK) IMPLANT
LIGHT WAVEGUIDE WIDE FLAT (MISCELLANEOUS) ×1 IMPLANT
MARKER SKIN DUAL TIP RULER LAB (MISCELLANEOUS) ×1 IMPLANT
NDL HYPO 25X1 1.5 SAFETY (NEEDLE) IMPLANT
NDL SAFETY ECLIPSE 18X1.5 (NEEDLE) ×1 IMPLANT
NEEDLE HYPO 18GX1.5 SHARP (NEEDLE) ×1
NEEDLE HYPO 25X1 1.5 SAFETY (NEEDLE) IMPLANT
NS IRRIG 1000ML POUR BTL (IV SOLUTION) ×2 IMPLANT
PACK BASIN DAY SURGERY FS (CUSTOM PROCEDURE TRAY) ×2 IMPLANT
PENCIL SMOKE EVACUATOR (MISCELLANEOUS) ×2 IMPLANT
PIN SAFETY STERILE (MISCELLANEOUS) ×2 IMPLANT
PUNCH BIOPSY DERMAL 4MM (MISCELLANEOUS) IMPLANT
RETRACTOR ONETRAX LX 90X20 (MISCELLANEOUS) IMPLANT
SHEET MEDIUM DRAPE 40X70 STRL (DRAPES) ×3 IMPLANT
SLEEVE SCD COMPRESS KNEE MED (STOCKING) ×2 IMPLANT
SPONGE T-LAP 18X18 ~~LOC~~+RFID (SPONGE) ×6 IMPLANT
SPONGE T-LAP 4X18 ~~LOC~~+RFID (SPONGE) IMPLANT
STAPLER VISISTAT 35W (STAPLE) ×2 IMPLANT
STRIP CLOSURE SKIN 1/2X4 (GAUZE/BANDAGES/DRESSINGS) IMPLANT
SUT CHROMIC 4 0 PS 2 18 (SUTURE) ×3 IMPLANT
SUT ETHILON 2 0 FS 18 (SUTURE) ×2 IMPLANT
SUT MNCRL AB 4-0 PS2 18 (SUTURE) ×3 IMPLANT
SUT PDS AB 2-0 CT2 27 (SUTURE) IMPLANT
SUT SILK 2 0 SH (SUTURE) ×1 IMPLANT
SUT VIC AB 2-0 SH 27 (SUTURE)
SUT VIC AB 2-0 SH 27XBRD (SUTURE) ×2 IMPLANT
SUT VIC AB 3-0 54X BRD REEL (SUTURE) IMPLANT
SUT VIC AB 3-0 BRD 54 (SUTURE)
SUT VIC AB 3-0 SH 27 (SUTURE) ×2
SUT VIC AB 3-0 SH 27X BRD (SUTURE) IMPLANT
SUT VICRYL 0 CT-2 (SUTURE) ×3 IMPLANT
SUT VICRYL 3-0 CR8 SH (SUTURE) ×2 IMPLANT
SUT VICRYL 4-0 PS2 18IN ABS (SUTURE) ×1 IMPLANT
SUT VLOC 180 0 24IN GS25 (SUTURE) ×1 IMPLANT
SYR 10ML LL (SYRINGE) ×2 IMPLANT
SYR 50ML LL SCALE MARK (SYRINGE) ×2 IMPLANT
SYR BULB IRRIG 60ML STRL (SYRINGE) ×3 IMPLANT
SYR CONTROL 10ML LL (SYRINGE) IMPLANT
TAPE MEASURE VINYL STERILE (MISCELLANEOUS) IMPLANT
TISSUE EXPNDR FV FOURTE 500 (Prosthesis & Implant Plastic) ×2 IMPLANT
TOWEL GREEN STERILE FF (TOWEL DISPOSABLE) ×4 IMPLANT
TRAY DSU PREP LF (CUSTOM PROCEDURE TRAY) ×1 IMPLANT
TRAY FOLEY W/BAG SLVR 14FR LF (SET/KITS/TRAYS/PACK) IMPLANT
TUBE CONNECTING 20X1/4 (TUBING) ×2 IMPLANT
UNDERPAD 30X36 HEAVY ABSORB (UNDERPADS AND DIAPERS) ×4 IMPLANT
YANKAUER SUCT BULB TIP NO VENT (SUCTIONS) ×2 IMPLANT

## 2021-06-04 NOTE — Transfer of Care (Signed)
Immediate Anesthesia Transfer of Care Note  Patient: Maria Kennedy  Procedure(s) Performed: LEFT MASTECTOMY, LEFT AXILLARY SENTINEL LYMPH NODE BIOPSY, LEFT AXILLARY NODE SEED GUIDED EXCISION (Left: Breast) BREAST RECONSTRUCTION WITH PLACEMENT OF TISSUE EXPANDER AND ALLODERM (Left: Breast)  Patient Location: PACU  Anesthesia Type:GA combined with regional for post-op pain  Level of Consciousness: sedated  Airway & Oxygen Therapy: Patient Spontanous Breathing and Patient connected to face mask oxygen  Post-op Assessment: Report given to RN and Post -op Vital signs reviewed and stable  Post vital signs: Reviewed and stable  Last Vitals:  Vitals Value Taken Time  BP 113/85 06/04/21 1158  Temp    Pulse 93 06/04/21 1159  Resp 17 06/04/21 1159  SpO2 98 % 06/04/21 1159  Vitals shown include unvalidated device data.  Last Pain:  Vitals:   06/04/21 0709  TempSrc: Oral  PainSc: 0-No pain         Complications: No notable events documented.

## 2021-06-04 NOTE — Op Note (Signed)
Preoperative diagnosis: HER2 positive left breast cancer, node positive, status post primary systemic therapy Postoperative diagnosis: Same as above Procedure: 1.  Left skin sparing mastectomy 2.  Left axillary radioactive seed guided node excision 3.  Left axillary sentinel lymph node biopsy 4.  Injection of tracer for sentinel lymph node identification Surgeon: Dr. Serita Grammes Anesthesia: General with a pectoral block Estimated blood loss: 50 cc Drains: None Specimens: 1.  Left breast tissue marked short superior, long lateral 2.  Left axillary node containing radioactive seed 3.  Left axillary sentinel lymph nodes Sponge needle count was correct at completion Disposition to recovery stable condition  Indications:49 y.o. female who is seen today after completing chemotherapy. She was diagnosed in March with new left breast cancer. She had fh but negative genetics. Mm had 5.5x3.6x3.3 cm mass. Mri had 9.5x5x5.4.5 cm mass with 3 abnormal nodes. She had biopsy of mass that is grade III IDC that is er/pr neg, her 2 pos, ki 70%, node was positive. She has underonge primary chemotherapy which she completed yesterday. Notes her nipple is not inverted now. No issues with chemo. Repeat mri shows 10x3.6x4 cm mass but significantly less enhancement. Nodes are all normal.  We discussed proceeding with a mastectomy combined with reconstruction per her choice as well as a targeted node dissection.  Procedure: After informed consent was obtained the patient was first injected with Lymphoseek in the standard periareolar fashion.  She also underwent a pectoral block.  I also used mag trace.  I cleansed the skin.  I then injected 2% lidocaine in the subareolar position as well as the skin.  I then used a 26-gauge needle to inject 2 cc of mag trace in the subareolar position.  She tolerated this well.  She was then taken to the operating room.  She was placed under general anesthesia without complication.   She was prepped and draped in the standard sterile surgical fashion.  A surgical timeout was then performed.  I first did the mastectomy.  She had a triangular incision around the nipple areolar complex marked out by Dr. Iran Planas.  I made the incision.  I carried the flaps to the parasternal area, inframammary fold, clavicle, and latissimus laterally.  I then was able to remove the breast and the pectoralis fashion from the muscle and rolled this laterally.  The breast was then removed and passed off the table.  It was marked as above.  I then was able to identify the radioactive seed.  This node was immediately adherent to the skin.  I was able to remove this and there indeed was a lymph node there.  The clip was not on the mammogram film but the seed was.  With the Lymphoseek I really was not able to identify any additional sentinel lymph nodes.  However with the mag trace I was unable to identify at least a couple of other sentinel lymph nodes with the counts above 1000.  I removed a packet of lymph nodes in the lower axilla that were both sentinel nodes and then 1 that was visible.  There was no other adenopathy noted in her axilla.  There was no background tracer present at all either.  I then obtained hemostasis.  I packed this and turned the case over to plastic surgery.

## 2021-06-04 NOTE — Interval H&P Note (Signed)
History and Physical Interval Note:  06/04/2021 8:21 AM  Maria Kennedy  has presented today for surgery, with the diagnosis of left breast cancer.  The various methods of treatment have been discussed with the patient and family. After consideration of risks, benefits and other options for treatment, the patient has consented to  Procedure(s) with comments: LEFT MASTECTOMY, LEFT AXILLARY SENTINEL LYMPH NODE BIOPSY, LEFT AXILALRY NODE SEED GUIDED EXCISION (Left) - GEN AND PEC BLOCK BREAST RECONSTRUCTION WITH PLACEMENT OF TISSUE EXPANDER AND ALLODERM (Left) as a surgical intervention.  The patient's history has been reviewed, patient examined, no change in status, stable for surgery.  I have reviewed the patient's chart and labs.  Questions were answered to the patient's satisfaction.     [name] [name]

## 2021-06-04 NOTE — H&P (Signed)
  50 y.o. female who is seen today after completing chemotherapy. She was diagnosed in March with new left breast cancer. She had fh but negative genetics. Mm had 5.5x3.6x3.3 cm mass. Mri had 9.5x5x5.4.5 cm mass with 3 abnormal nodes. She had biopsy of mass that is grade III IDC that is er/pr neg, her 2 pos, ki 70%, node was positive. She has underonge primary chemotherapy which she completed yesterday. Notes her nipple is not inverted now. No issues with chemo. Repeat mri shows 10x3.6x4 cm mass but significantly less enhancement. Nodes are all normal. No discharge. Here with her husband to discuss options  Review of Systems: A complete review of systems was obtained from the patient. I have reviewed this information and discussed as appropriate with the patient. See HPI as well for other ROS.  Review of Systems  All other systems reviewed and are negative.   Medical History: Past Medical History:  Diagnosis Date   Anemia   Anxiety   Asthma, unspecified asthma severity, unspecified whether complicated, unspecified whether persistent   History of cancer   There is no problem list on file for this patient.  Past Surgical History:  Procedure Laterality Date   port a cath insertion N/A   tubligation N/A    Allergies  Allergen Reactions   Latex Itching   Current Outpatient Medications on File Prior to Visit  Medication Sig Dispense Refill   dexAMETHasone (DECADRON) 4 MG tablet Take by mouth   ondansetron (ZOFRAN) 8 MG tablet Take by mouth   prochlorperazine (COMPAZINE) 10 MG tablet Take by mouth   acetaminophen (TYLENOL) 650 MG ER tablet Take 650 mg by mouth every 8 (eight) hours as needed   albuterol 90 mcg/actuation inhaler Inhale into the lungs   cholecalciferol, vitamin D3, (CHOLECALCIFEROL, VIT D3,,BULK,) 100,000 unit/gram Powd Take by mouth   LORazepam (ATIVAN) 0.5 MG tablet Take 0.5 mg by mouth nightly   No current facility-administered medications on file prior to visit.    Family History  Problem Relation Age of Onset   High blood pressure (Hypertension) Mother   Hyperlipidemia (Elevated cholesterol) Mother   Diabetes Mother   Hyperlipidemia (Elevated cholesterol) Father   Deep vein thrombosis (DVT or abnormal blood clot formation) Sister    Social History   Tobacco Use  Smoking Status Never Smoker  Smokeless Tobacco Never Used    Social History   Socioeconomic History   Marital status: Unknown  Tobacco Use   Smoking status: Never Smoker   Smokeless tobacco: Never Used  Scientific laboratory technician Use: Never used  Substance and Sexual Activity   Alcohol use: Yes   Drug use: Never     Body mass index is 31.28 kg/m.  Physical Exam Constitutional:  Appearance: Normal appearance.  Pulmonary:  Comments: No breast mass bilaterally No axillary adenopathy Neurological:  Mental Status: She is alert.     Assessment and Plan:  Diagnoses and all orders for this visit:  Breast cancer metastasized to axillary lymph node, left (CMS-HCC)   Left [facility] with left TAD Discussed follow up mri and plan for surgery in about a month. I think needs mastectomy now and that nsm not good option. Discussed [facility]sm with and without recon. She desires recon and I think reasonable. Will have her see Dr Iran Planas. Plan for left ax sn biopsy as well as seed guided node excision. Discussed surgery, risks and recovery

## 2021-06-04 NOTE — Anesthesia Preprocedure Evaluation (Signed)
Anesthesia Evaluation  Patient identified by MRN, date of birth, ID band Patient awake    Reviewed: Allergy & Precautions, H&P , NPO status , Patient's Chart, lab work & pertinent test results  Airway Mallampati: II   Neck ROM: full    Dental   Pulmonary asthma ,    breath sounds clear to auscultation       Cardiovascular negative cardio ROS   Rhythm:regular Rate:Normal     Neuro/Psych PSYCHIATRIC DISORDERS Depression ADHD   GI/Hepatic   Endo/Other    Renal/GU      Musculoskeletal   Abdominal (+) + obese,   Peds  Hematology   Anesthesia Other Findings Breast Cancer  Reproductive/Obstetrics Breast CA                             Anesthesia Physical  Anesthesia Plan  ASA: 3  Anesthesia Plan: General   Post-op Pain Management:  Regional for Post-op pain   Induction: Intravenous  PONV Risk Score and Plan: 3 and Ondansetron, Dexamethasone, Midazolam and Treatment may vary due to age or medical condition  Airway Management Planned: Oral ETT  Additional Equipment:   Intra-op Plan:   Post-operative Plan: Extubation in OR  Informed Consent: I have reviewed the patients History and Physical, chart, labs and discussed the procedure including the risks, benefits and alternatives for the proposed anesthesia with the patient or authorized representative who has indicated his/her understanding and acceptance.     Dental advisory given  Plan Discussed with: CRNA, Anesthesiologist and Surgeon  Anesthesia Plan Comments:         Anesthesia Quick Evaluation

## 2021-06-04 NOTE — Anesthesia Procedure Notes (Signed)
Procedure Name: Intubation Date/Time: 06/04/2021 10:09 AM Performed by: Tawni Millers, CRNA Pre-anesthesia Checklist: Patient identified, Emergency Drugs available, Suction available and Patient being monitored Patient Re-evaluated:Patient Re-evaluated prior to induction Oxygen Delivery Method: Circle system utilized Preoxygenation: Pre-oxygenation with 100% oxygen Induction Type: IV induction Ventilation: Mask ventilation without difficulty Laryngoscope Size: Mac and 3 Grade View: Grade I Tube type: Oral Tube size: 7.0 mm Number of attempts: 1 Airway Equipment and Method: Stylet and Oral airway Placement Confirmation: ETT inserted through vocal cords under direct vision, positive ETCO2 and breath sounds checked- equal and bilateral Tube secured with: Tape Dental Injury: Teeth and Oropharynx as per pre-operative assessment

## 2021-06-04 NOTE — Progress Notes (Signed)
Assisted Dr. Miller with left, ultrasound guided, pectoralis block. Side rails up, monitors on throughout procedure. See vital signs in flow sheet. Tolerated Procedure well. 

## 2021-06-04 NOTE — Anesthesia Procedure Notes (Signed)
Anesthesia Regional Block: Pectoralis block   Pre-Anesthetic Checklist: , timeout performed,  Correct Patient, Correct Site, Correct Laterality,  Correct Procedure, Correct Position, site marked,  Risks and benefits discussed,  Surgical consent,  Pre-op evaluation,  At surgeon's request and post-op pain management  Laterality: Left  Prep: chloraprep       Needles:  Injection technique: Single-shot  Needle Type: Stimiplex     Needle Length: 9cm  Needle Gauge: 21     Additional Needles:   Procedures:,,,, ultrasound used (permanent image in chart),,    Narrative:  Start time: 06/04/2021 7:48 AM End time: 06/04/2021 7:53 AM Injection made incrementally with aspirations every 5 mL.  Performed by: Personally  Anesthesiologist: Lynda Rainwater, MD

## 2021-06-04 NOTE — Op Note (Signed)
Operative Note   DATE OF OPERATION: 8.23.22  LOCATION: [facility]-observation  SURGICAL DIVISION: Plastic Surgery  PREOPERATIVE DIAGNOSES:  1. Left breast UOQ ER-   POSTOPERATIVE DIAGNOSES:  same  PROCEDURE:  1. Left breast reconstruction with tissue expander 2. Acellular dermis (Alloderm) 300 cm2 to left chest  SURGEON: Irene Limbo MD MBA  ASSISTANT: none  ANESTHESIA:  General.   EBL: 100 ml for entire procedure.  COMPLICATIONS: None immediate.   INDICATIONS FOR PROCEDURE:  The patient, Maria Kennedy, is a 50 y.o. female born on 1971-01-13, is here for immediate prepectoral tissue expander acellular dermis reconstruction following left skin reduction pattern mastectomy.   FINDINGS: Natrelle 133S FV-13-T 500 ml tissue expander placed initial fill volume 250 ml air. SN [id number]  DESCRIPTION OF PROCEDURE:  The patient was marked to mark sternal notch, chest midline, anterior axillary lines and inframammary folds. Patient was marked for skin reduction mastectomy with most superior portion nipple areola marked on breast meridian. Vertical limbs marked by breast displacement and set at 9 cm length. The patient was taken to the operating room. SCDs were placed and IV antibiotics were given. The patient's operative site was prepped and draped in a sterile fashion. A time out was performed and all information was confirmed to be correct. In supine position, the lateral limbs for resection marked and area over lower pole preserved as inferiorly based dermal pedicle. Skin de epithelialized in this area.    I assisted in mastectomy with exposure and retraction. Following completion of mastectomy, the cavity was irrigated with saline solution containing Ancef, gentamicin, and Betadine. Hemostasis was ensured. A 19 Fr drain was placed in subcutaneous position laterally and a 15 Fr drain placed along inframammary fold. Each secured to skin with 2-0 nylon. The tissue expander was  prepared on back table prior in insertion. The expander was filled with air. Perforated acellular dermis was  draped over anterior surface expander. The ADM was then secured to itself over posterior surface of expander with 4-0 chromic. Redundant folds acellular dermis excised so that the ADM lay flat without folds over air filled expander. The expander was secured to medial insertion pectoralis with a 0 vicryl. The superior and lateral tabs also secured to pectoralis muscle with 0-vicryl. The ADM was secured to pectoralis muscle and chest wall along inferior border at inframammary fold with 0 V lock. Laterally the mastectomy flap over posterior axillary line was advanced anteriorly and the subcutaneous tissue and superficial fascia was secured to chest wall with 0-vicryl. The inferiorly based dermal pedicle was redraped superiorly over expander and acellular dermis and secured to pectoralis with interrupted 0-vicryl. Skin closure completed with 3-0 vicryl in fascial layer and 4-0 vicryl in dermis. Skin closure completed with 4-0 monocryl subcuticular and tissue adhesive. Tegaderms applied, followed by dry dressing and breast binder  The patient was allowed to wake from anesthesia, extubated and taken to the recovery room in satisfactory condition.   SPECIMENS: none  DRAINS: 15 and 19 Fr JP in left breast reconstruction

## 2021-06-04 NOTE — Interval H&P Note (Signed)
History and Physical Interval Note:  06/04/2021 8:28 AM  Maria Kennedy  has presented today for surgery, with the diagnosis of left breast cancer.  The various methods of treatment have been discussed with the patient and family. After consideration of risks, benefits and other options for treatment, the patient has consented to  Procedure(s) with comments: LEFT MASTECTOMY, LEFT AXILLARY SENTINEL LYMPH NODE BIOPSY, LEFT AXILALRY NODE SEED GUIDED EXCISION (Left) - GEN AND PEC BLOCK BREAST RECONSTRUCTION WITH PLACEMENT OF TISSUE EXPANDER AND ALLODERM (Left) as a surgical intervention.  The patient's history has been reviewed, patient examined, no change in status, stable for surgery.  I have reviewed the patient's chart and labs.  Questions were answered to the patient's satisfaction.     Rolm Bookbinder

## 2021-06-05 ENCOUNTER — Encounter (HOSPITAL_BASED_OUTPATIENT_CLINIC_OR_DEPARTMENT_OTHER): Payer: Self-pay | Admitting: General Surgery

## 2021-06-05 DIAGNOSIS — N6022 Fibroadenosis of left breast: Secondary | ICD-10-CM | POA: Diagnosis not present

## 2021-06-05 NOTE — Anesthesia Postprocedure Evaluation (Signed)
Anesthesia Post Note  Patient: Tayia Owensby  Procedure(s) Performed: LEFT MASTECTOMY, LEFT AXILLARY SENTINEL LYMPH NODE BIOPSY, LEFT AXILLARY NODE SEED GUIDED EXCISION (Left: Breast) BREAST RECONSTRUCTION WITH PLACEMENT OF TISSUE EXPANDER AND ALLODERM (Left: Breast)     Patient location during evaluation: PACU Anesthesia Type: General Level of consciousness: awake and alert Pain management: pain level controlled Vital Signs Assessment: post-procedure vital signs reviewed and stable Respiratory status: spontaneous breathing, nonlabored ventilation and respiratory function stable Cardiovascular status: blood pressure returned to baseline and stable Postop Assessment: no apparent nausea or vomiting Anesthetic complications: no   No notable events documented.  Last Vitals:  Vitals:   06/05/21 0500 06/05/21 0600  BP:    Pulse: 88 81  Resp:    Temp:    SpO2: 98% 96%    Last Pain:  Vitals:   06/05/21 0600  TempSrc:   PainSc: Asleep                 Lynda Rainwater

## 2021-06-05 NOTE — Discharge Summary (Signed)
Physician Discharge Summary  Patient ID: Maria Kennedy MRN: [id number] DOB/AGE: [birth year] 48 y.o.  Admit date: 06/04/2021 Discharge date: 06/05/2021  Admission Diagnoses: Left breast cancer UOQ ER-  Discharge Diagnoses:  same  Discharged Condition: stable  Hospital Course: Post operatively patient did well tolerating diet, ambulatory with minimal assist and tolerating oral pain medication. Instructed on bathing and drain care.  Treatments: surgery: left mastectomy sentinel node left breast reconstruction with tissue expander acellular dermis 8.23.22  Discharge Exam: Blood pressure 109/77, pulse 94, temperature 98.3 F (36.8 C), resp. rate 16, height '5\' 3"'$  (1.6 m), weight 81.6 kg, SpO2 99 %. Incision/Wound: chest with tegaderms in place soft incisions dry intact drains serosanguinous  Disposition: Discharge disposition: 01-Home or Self Care       Discharge Instructions     Call MD for:  redness, tenderness, or signs of infection (pain, swelling, bleeding, redness, odor or green/yellow discharge around incision site)   Complete by: As directed    Call MD for:  temperature >100.5   Complete by: As directed    Discharge instructions   Complete by: As directed    Ok to remove dressings and shower am 8.25.22. Soap and water ok, pat Tegaderms dry. Do not remove Tegaderms. No creams or ointments over incisions. Do not let drains dangle in shower, attach to lanyard or similar.Strip and record drains twice daily and bring log to clinic visit.  Breast binder or soft compression bra all other times.  Ok to raise arms above shoulders for bathing and dressing.  No house yard work or exercise until cleared by MD.   Patient received all Rx prior to surgery. Recommend ibuprofen with meals to aid with pain control. Recommend Miralax or Dulcolax for constipation. Also ok to use Tylenol for pain control.   Driving Restrictions   Complete by: As directed    No driving for 2 weeks then no  driving if taking narcotics   Lifting restrictions   Complete by: As directed    No lifting > 5-10 lbs until cleared by MD   Resume previous diet   Complete by: As directed       Allergies as of 06/05/2021       Reactions   Latex Itching        Medication List     TAKE these medications    acetaminophen 650 MG CR tablet Commonly known as: TYLENOL Take 650 mg by mouth every 8 (eight) hours as needed for pain.   albuterol 108 (90 Base) MCG/ACT inhaler Commonly known as: VENTOLIN HFA Inhale 2 puffs into the lungs every 6 (six) hours as needed for wheezing or shortness of breath.   amphetamine-dextroamphetamine 20 MG 24 hr capsule Commonly known as: ADDERALL XR Take 20 mg by mouth daily as needed.   dexamethasone 4 MG tablet Commonly known as: DECADRON Take 1 tablet (4 mg total) by mouth daily. Take 1 tablet day before chemo and 1 tablet day after chemo with food   lidocaine-prilocaine cream Commonly known as: EMLA Apply to affected area once   LORazepam 0.5 MG tablet Commonly known as: ATIVAN Take 1 tablet (0.5 mg total) by mouth at bedtime.   ondansetron 8 MG tablet Commonly known as: Zofran Take 1 tablet (8 mg total) by mouth 2 (two) times daily as needed (Nausea or vomiting). Start on the third day after chemotherapy.   prochlorperazine 10 MG tablet Commonly known as: COMPAZINE Take 1 tablet (10 mg total) by mouth every 6 (six) hours  as needed (Nausea or vomiting).   VITAMIN D PO Take 5,000 Int'l Units by mouth every other day.   Vitamin K2 40 MCG Tabs Take 2 tablets by mouth daily. 80 mcg daily total        Follow-up Information     Irene Limbo, MD Follow up in 1 week(s).   Specialty: Plastic Surgery Why: as scheduled Contact information: [address] [place] [address] [phone]         Rolm Bookbinder, MD. Schedule an appointment as soon as possible for a visit in 2 week(s).   Specialty: General  Surgery Contact information: [place] [address] [phone]                 Signed: Irene Limbo 06/05/2021, 8:05 AM

## 2021-06-05 NOTE — Discharge Instructions (Addendum)
*  You had '5mg'$  of Oxycodone and 500 mg of robaxin at 7:35 am  About my Jackson-Pratt Bulb Drain  What is a Jackson-Pratt bulb? A Jackson-Pratt is a soft, round device used to collect drainage. It is connected to a long, thin drainage catheter, which is held in place by one or two small stiches near your surgical incision site. When the bulb is squeezed, it forms a vacuum, forcing the drainage to empty into the bulb.  Emptying the Jackson-Pratt bulb- To empty the bulb: 1. Release the plug on the top of the bulb. 2. Pour the bulb's contents into a measuring container which your nurse will provide. 3. Record the time emptied and amount of drainage. Empty the drain(s) as often as your     doctor or nurse recommends.  Date                  Time                    Amount (Drain 1)                 Amount (Drain 2)  ________________________________________________________________________________  ________________________________________________________________________________  ________________________________________________________________________________  ________________________________________________________________________________  ________________________________________________________________________________  ________________________________________________________________________________  ________________________________________________________________________________  ________________________________________________________________________________  Squeezing the Jackson-Pratt Bulb- To squeeze the bulb: 1. Make sure the plug at the top of the bulb is open. 2. Squeeze the bulb tightly in your fist. You will hear air squeezing from the bulb. 3. Replace the plug while the bulb is squeezed. 4. Use a safety pin to attach the bulb to your clothing. This will keep the catheter from     pulling at the bulb insertion site.  When to call your doctor- Call your doctor if: Drain site  becomes red, swollen or hot. You have a fever greater than 101 degrees F. There is oozing at the drain site. Drain falls out (apply a guaze bandage over the drain hole and secure it with tape). Drainage increases daily not related to activity patterns. (You will usually have more drainage when you are active than when you are resting.) Drainage has a bad odor.

## 2021-06-10 LAB — SURGICAL PATHOLOGY

## 2021-06-11 ENCOUNTER — Encounter: Payer: Self-pay | Admitting: *Deleted

## 2021-06-12 NOTE — Progress Notes (Signed)
Patient Care Team: London Pepper, MD as PCP - General (Family Medicine) Mauro Kaufmann, RN as Oncology Nurse Navigator Rockwell Germany, RN as Oncology Nurse Navigator Rolm Bookbinder, MD as Consulting Physician (General Surgery) Nicholas Lose, MD as Consulting Physician (Hematology and Oncology) Eppie Gibson, MD as Attending Physician (Radiation Oncology)  DIAGNOSIS:    ICD-10-CM   1. Malignant neoplasm of upper-outer quadrant of left breast in female, estrogen receptor negative ([place])  C50.412    Z17.1       SUMMARY OF ONCOLOGIC HISTORY: Oncology History  Malignant neoplasm of upper-outer quadrant of left breast in female, estrogen receptor negative ([place])  12/19/2020 Initial Diagnosis   Patient palpated a left breast mass and noted left axilla tenderness for one week. Diagnostic mammogram and US showed a conglomeration of masses in the left breast at the 2 o'clock position 3-4cm from the nipple spanning 3.9cm total, with a 0.7cm mass at the 2 o'clock position 6cm from the nipple, and two abnormal axillary lymph nodes. 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   12/26/2020 Cancer Staging   Staging form: Breast, AJCC 8th Edition - Clinical stage from 12/26/2020: Stage IIIA (cT3, cN1, cM0, G3, ER-, PR-, HER2+) - Signed by Nicholas Lose, MD on 12/26/2020 Stage prefix: Initial diagnosis Histologic grading system: 3 grade system   01/08/2021 -  Chemotherapy    Patient is on Treatment Plan: BREAST  DOCETAXEL + CARBOPLATIN + TRASTUZUMAB + PERTUZUMAB  (TCHP) Q21D        01/18/2021 Genetic Testing   Negative genetic testing:  No pathogenic variants detected on the [facility] CancerNext-Expanded + RNAinsight panel. The report date is 01/18/2021.   The CancerNext-Expanded + RNAinsight gene panel offered by [employer] and includes sequencing and rearrangement analysis for the following 77 genes: AIP, ALK, APC, ATM, AXIN2, BAP1,  BARD1, BLM, BMPR1A, BRCA1, BRCA2, BRIP1, CDC73, CDH1, CDK4, CDKN1B, CDKN2A, CHEK2, CTNNA1, DICER1, FANCC, FH, FLCN, GALNT12, KIF1B, LZTR1, MAX, MEN1, MET, MLH1, MSH2, MSH3, MSH6, MUTYH, NBN, NF1, NF2, NTHL1, PALB2, PHOX2B, PMS2, POT1, PRKAR1A, PTCH1, PTEN, RAD51C, RAD51D, RB1, RECQL, RET, SDHA, SDHAF2, SDHB, SDHC, SDHD, SMAD4, SMARCA4, SMARCB1, SMARCE1, STK11, SUFU, TMEM127, TP53, TSC1, TSC2, VHL and XRCC2 (sequencing and deletion/duplication); EGFR, EGLN1, HOXB13, KIT, MITF, PDGFRA, POLD1 and POLE (sequencing only); EPCAM and GREM1 (deletion/duplication only). RNA data is routinely analyzed for use in variant interpretation for all genes.     CHIEF COMPLIANT: Cycle  TCHP  INTERVAL HISTORY: Maria Kennedy is a 50 y.o. with above-mentioned history of breast cancer currently on neoadjuvant chemotherapy with TCH Perjeta. She presents to the clinic today for a toxicity check and cycle 7.   ALLERGIES:  is allergic to latex.  MEDICATIONS:  Current Outpatient Medications  Medication Sig Dispense Refill   acetaminophen (TYLENOL) 650 MG CR tablet Take 650 mg by mouth every 8 (eight) hours as needed for pain.     albuterol (VENTOLIN HFA) 108 (90 Base) MCG/ACT inhaler Inhale 2 puffs into the lungs every 6 (six) hours as needed for wheezing or shortness of breath.     amphetamine-dextroamphetamine (ADDERALL XR) 20 MG 24 hr capsule Take 20 mg by mouth daily as needed.     dexamethasone (DECADRON) 4 MG tablet Take 1 tablet (4 mg total) by mouth daily. Take 1 tablet day before chemo and 1 tablet day after chemo with food 12 tablet 0   lidocaine-prilocaine (EMLA) cream Apply to affected area once 30 g 3  LORazepam (ATIVAN) 0.5 MG tablet Take 1 tablet (0.5 mg total) by mouth at bedtime. 30 tablet 3   Menaquinone-7 (VITAMIN K2) 40 MCG TABS Take 2 tablets by mouth daily. 80 mcg daily total     ondansetron (ZOFRAN) 8 MG tablet Take 1 tablet (8 mg total) by mouth 2 (two) times daily as needed (Nausea or vomiting).  Start on the third day after chemotherapy. 30 tablet 1   prochlorperazine (COMPAZINE) 10 MG tablet Take 1 tablet (10 mg total) by mouth every 6 (six) hours as needed (Nausea or vomiting). 30 tablet 1   VITAMIN D PO Take 5,000 Int'l Units by mouth every other day.     No current facility-administered medications for this visit.    PHYSICAL EXAMINATION: ECOG PERFORMANCE STATUS: 1 - Symptomatic but completely ambulatory  Vitals:   06/13/21 1533  BP: (!) 125/92  Pulse: 98  Resp: 18  Temp: 97.7 F (36.5 C)  SpO2: 99%   Filed Weights   06/13/21 1533  Weight: 173 lb 4.8 oz (78.6 kg)      LABORATORY DATA:  I have reviewed the data as listed CMP Latest Ref Rng & Units 05/14/2021 04/23/2021 04/02/2021  Glucose 70 - 99 mg/dL 93 197(H) 214(H)  BUN 6 - 20 mg/dL _0 Creatinine 0.44 - 1.00 mg/dL 0.76 0.82 0.72  Sodium 135 - 145 mmol/L 140 140 138  Potassium 3.5 - 5.1 mmol/L 3.9 4.3 4.2  Chloride 98 - 111 mmol/L 106 106 107  CO2 22 - 32 mmol/L _1 Calcium 8.9 - 10.3 mg/dL 9.3 10.1 8.9  Total Protein 6.5 - 8.1 g/dL 6.5 7.0 7.0  Total Bilirubin 0.3 - 1.2 mg/dL 0.3 0.3 0.3  Alkaline Phos 38 - 126 U/L 53 56 75  AST 15 - 41 U/L 27 16 40  ALT 0 - 44 U/L 24 16 49(H)    Lab Results  Component Value Date   WBC 5.5 05/14/2021   HGB 10.2 (L) 05/14/2021   HCT 32.1 (L) 05/14/2021   MCV 96.1 05/14/2021   PLT 305 05/14/2021   NEUTROABS 3.2 05/14/2021    ASSESSMENT & PLAN:  Malignant neoplasm of upper-outer quadrant of left breast in female, estrogen receptor negative ([place]) 12/19/2020: Palpable left breast mass and left axillary tenderness.  Mammogram revealed conglomeration of masses 3.9 cm and an adjacent 0.7 cm mass and 2 axillary lymph nodes that were abnormal. Biopsy revealed IDC with DCIS, grade 3, ER/PR negative, HER-2 +3+ by [facility]   12/24/2020: Breast MRI revealed mass or non-mass enhancement measuring 9.5 cm and 3 abnormal lymph nodes 01-02-21: Bone scan: No bone  mets 01/01/2021: CT CAP: No distant metastatic disease   Treatment Plan: 1. Neoadjuvant chemotherapy with TCH Perjeta 6 cycles followed by Herceptin Perjeta maintenance  2. left mastectomy 06/04/2021: Dr. Donne Hazel: Complete pathologic response, no residual cancer, 0/1 lymph node negative 3. Followed by adjuvant radiation therapy  -------------------------------------------------------------------------------------------------------------------------------- Pathology counseling: I discussed the final pathology report of the patient provided  a copy of this report. I discussed the margins as well as lymph node surgeries. We also discussed the final staging along with previously performed ER/PR and HER-2/neu testing.  Based on complete pathologic response, recommended continuation of Herceptin and Perjeta maintenance. Return to clinic to start back on Herceptin and Perjeta and every 6 weeks for follow-up with me.    No orders of the defined types were placed in this encounter.  The patient has a good understanding of the  overall plan. she agrees with it. she will call with any problems that may develop before the next visit here.  Total time spent: 30 mins including face to face time and time spent for planning, charting and coordination of care  Rulon Eisenmenger, MD, MPH 06/13/2021  I, Thana Ates, am acting as scribe for Dr. Nicholas Lose.  I have reviewed the above documentation for accuracy and completeness, and I agree with the above.

## 2021-06-13 ENCOUNTER — Other Ambulatory Visit: Payer: Self-pay

## 2021-06-13 ENCOUNTER — Inpatient Hospital Stay (HOSPITAL_BASED_OUTPATIENT_CLINIC_OR_DEPARTMENT_OTHER): Payer: 59 | Admitting: Hematology and Oncology

## 2021-06-13 DIAGNOSIS — Z79899 Other long term (current) drug therapy: Secondary | ICD-10-CM | POA: Insufficient documentation

## 2021-06-13 DIAGNOSIS — C50412 Malignant neoplasm of upper-outer quadrant of left female breast: Secondary | ICD-10-CM | POA: Diagnosis present

## 2021-06-13 DIAGNOSIS — Z5112 Encounter for antineoplastic immunotherapy: Secondary | ICD-10-CM | POA: Diagnosis present

## 2021-06-13 DIAGNOSIS — Z171 Estrogen receptor negative status [ER-]: Secondary | ICD-10-CM

## 2021-06-13 DIAGNOSIS — Z9012 Acquired absence of left breast and nipple: Secondary | ICD-10-CM | POA: Insufficient documentation

## 2021-06-13 DIAGNOSIS — Z510 Encounter for antineoplastic radiation therapy: Secondary | ICD-10-CM | POA: Diagnosis not present

## 2021-06-13 NOTE — Assessment & Plan Note (Signed)
12/19/2020: Palpable left breast mass and left axillary tenderness. Mammogram revealed conglomeration of masses 3.9 cm and an adjacent 0.7 cm mass and 2 axillary lymph nodes that were abnormal. Biopsy revealed IDC with DCIS, grade 3, ER/PR negative, HER-2 +3+ by [facility]  12/24/2020: Breast MRI revealed mass or non-mass enhancement measuring 9.5 cm and 3 abnormal lymph nodes 01-02-21: Bone scan: No bone mets 01/01/2021: CT CAP: No distant metastatic disease  Treatment Plan: 1. Neoadjuvant chemotherapy with TCH Perjeta 6 cycles followed by Herceptin Perjeta maintenance  2. left mastectomy 06/04/2021: Dr. Donne Hazel: Complete pathologic response, no residual cancer, 0/1 lymph node negative 3. Followed by adjuvant radiation therapy -------------------------------------------------------------------------------------------------------------------------------- Pathology counseling: I discussed the final pathology report of the patient provided  a copy of this report. I discussed the margins as well as lymph node surgeries. We also discussed the final staging along with previously performed ER/PR and HER-2/neu testing.  Based on complete pathologic response, recommended continuation of Herceptin and Perjeta maintenance. Return to clinic to start back on Herceptin and Perjeta and every 6 weeks for follow-up with me.

## 2021-06-14 ENCOUNTER — Ambulatory Visit: Payer: 59 | Admitting: Hematology and Oncology

## 2021-06-18 ENCOUNTER — Other Ambulatory Visit: Payer: Self-pay | Admitting: Hematology and Oncology

## 2021-06-18 DIAGNOSIS — Z171 Estrogen receptor negative status [ER-]: Secondary | ICD-10-CM

## 2021-06-18 NOTE — Progress Notes (Signed)
DISCONTINUE ON PATHWAY REGIMEN - Breast     A cycle is every 21 days:     Pertuzumab      Pertuzumab      Trastuzumab-xxxx      Trastuzumab-xxxx      Carboplatin      Docetaxel   **Always confirm dose/schedule in your pharmacy ordering system**  REASON: Other Reason PRIOR TREATMENT: [id number]: Docetaxel + Carboplatin + Trastuzumab IV + Pertuzumab IV (TCHP IV) q21 Days x 6 Cycles TREATMENT RESPONSE: Complete Response (CR)  START ON PATHWAY REGIMEN - Breast     A cycle is every 21 days:     Trastuzumab-xxxx      Pertuzumab   **Always confirm dose/schedule in your pharmacy ordering system**  Patient Characteristics: Post-Neoadjuvant Therapy and Resection, HER2 Positive, ER Negative/Unknown, No Residual Disease, Adjuvant Targeted Therapy After Neoadjuvant Chemo/Targeted Therapy Therapeutic Status: Post-Neoadjuvant Therapy and Resection Residual Invasive Disease Post-Neoadjuvant Therapy<= No ER Status: Negative (-) HER2 Status: Positive (+) PR Status: Negative (-) Intent of Therapy: Curative Intent, Discussed with Patient

## 2021-06-19 ENCOUNTER — Encounter: Payer: Self-pay | Admitting: Hematology and Oncology

## 2021-06-20 ENCOUNTER — Encounter: Payer: Self-pay | Admitting: *Deleted

## 2021-06-21 ENCOUNTER — Encounter: Payer: Self-pay | Admitting: Hematology and Oncology

## 2021-06-22 ENCOUNTER — Other Ambulatory Visit: Payer: Self-pay

## 2021-06-22 ENCOUNTER — Inpatient Hospital Stay: Payer: 59

## 2021-06-22 VITALS — BP 119/82 | HR 74 | Temp 98.2°F | Resp 18 | Wt 174.5 lb

## 2021-06-22 DIAGNOSIS — Z171 Estrogen receptor negative status [ER-]: Secondary | ICD-10-CM

## 2021-06-22 DIAGNOSIS — C50412 Malignant neoplasm of upper-outer quadrant of left female breast: Secondary | ICD-10-CM | POA: Insufficient documentation

## 2021-06-22 DIAGNOSIS — Z5112 Encounter for antineoplastic immunotherapy: Secondary | ICD-10-CM | POA: Insufficient documentation

## 2021-06-22 DIAGNOSIS — Z510 Encounter for antineoplastic radiation therapy: Secondary | ICD-10-CM | POA: Diagnosis not present

## 2021-06-22 MED ORDER — DIPHENHYDRAMINE HCL 25 MG PO CAPS
25.0000 mg | ORAL_CAPSULE | Freq: Once | ORAL | Status: AC
  Administered 2021-06-22: 25 mg via ORAL
  Filled 2021-06-22: qty 1

## 2021-06-22 MED ORDER — SODIUM CHLORIDE 0.9 % IV SOLN: Freq: Once | INTRAVENOUS | Status: AC

## 2021-06-22 MED ORDER — SODIUM CHLORIDE 0.9% FLUSH
10.0000 mL | INTRAVENOUS | Status: DC | PRN
  Administered 2021-06-22: 10 mL

## 2021-06-22 MED ORDER — HEPARIN SOD (PORK) LOCK FLUSH 100 UNIT/ML IV SOLN
500.0000 [IU] | Freq: Once | INTRAVENOUS | Status: AC | PRN
  Administered 2021-06-22: 500 [IU]

## 2021-06-22 MED ORDER — TRASTUZUMAB-DKST CHEMO 150 MG IV SOLR
8.0000 mg/kg | Freq: Once | INTRAVENOUS | Status: AC
  Administered 2021-06-22: 630 mg via INTRAVENOUS
  Filled 2021-06-22: qty 30

## 2021-06-22 MED ORDER — ACETAMINOPHEN 325 MG PO TABS
650.0000 mg | ORAL_TABLET | Freq: Once | ORAL | Status: AC
  Administered 2021-06-22: 650 mg via ORAL
  Filled 2021-06-22: qty 2

## 2021-06-22 MED ORDER — SODIUM CHLORIDE 0.9 % IV SOLN
420.0000 mg | Freq: Once | INTRAVENOUS | Status: AC
  Administered 2021-06-22: 420 mg via INTRAVENOUS
  Filled 2021-06-22: qty 14

## 2021-06-22 NOTE — Patient Instructions (Signed)
[facility]COL[facility]  Discharge Instructions: Thank you for choosing [facility] to provide your oncology and hematology care.   If you have a lab appointment with the [facility], please go directly to the [facility] and check in at the registration area.   Wear comfortable clothing and clothing appropriate for easy access to any Portacath or PICC line.   We strive to give you quality time with your provider. You may need to reschedule your appointment if you arrive late (15 or more minutes).  Arriving late affects you and other patients whose appointments are after yours.  Also, if you miss three or more appointments without notifying the office, you may be dismissed from the clinic at the provider's discretion.      For prescription refill requests, have your pharmacy contact our office and allow 72 hours for refills to be completed.    Today you received the following chemotherapy and/or immunotherapy agents Ogivri & Perjeta      To help prevent nausea and vomiting after your treatment, we encourage you to take your nausea medication as directed.  BELOW ARE SYMPTOMS THAT SHOULD BE REPORTED IMMEDIATELY: *FEVER GREATER THAN 100.4 F (38 C) OR HIGHER *CHILLS OR SWEATING *NAUSEA AND VOMITING THAT IS NOT CONTROLLED WITH YOUR NAUSEA MEDICATION *UNUSUAL SHORTNESS OF BREATH *UNUSUAL BRUISING OR BLEEDING *URINARY PROBLEMS (pain or burning when urinating, or frequent urination) *BOWEL PROBLEMS (unusual diarrhea, constipation, pain near the anus) TENDERNESS IN MOUTH AND THROAT WITH OR WITHOUT PRESENCE OF ULCERS (sore throat, sores in mouth, or a toothache) UNUSUAL RASH, SWELLING OR PAIN  UNUSUAL VAGINAL DISCHARGE OR ITCHING   Items with * indicate a potential emergency and should be followed up as soon as possible or go to the Emergency Department if any problems should occur.  Please show the CHEMOTHERAPY ALERT CARD or IMMUNOTHERAPY ALERT CARD at  check-in to the Emergency Department and triage nurse.  Should you have questions after your visit or need to cancel or reschedule your appointment, please contact [facility]  Dept: [phone]  and follow the prompts.  Office hours are 8:00 a.m. to 4:30 p.m. Monday - Friday. Please note that voicemails left after 4:00 p.m. may not be returned until the following business day.  We are closed weekends and major holidays. You have access to a nurse at all times for urgent questions. Please call the main number to the clinic Dept: [phone] and follow the prompts.   For any non-urgent questions, you may also contact your provider using MyChart. We now offer e-Visits for anyone 38 and older to request care online for non-urgent symptoms. For details visit [email or web address].   Also download the MyChart app! Go to the app store, search "MyChart", open the app, select [facility], and log in with your MyChart username and password.  Due to Covid, a mask is required upon entering the hospital/clinic. If you do not have a mask, one will be given to you upon arrival. For doctor visits, patients may have 1 support person aged 31 or older with them. For treatment visits, patients cannot have anyone with them due to current Covid guidelines and our immunocompromised population.   Trastuzumab injection for infusion What is this medication? TRASTUZUMAB (tras TOO zoo mab) is a monoclonal antibody. It is used to treat breast cancer and stomach cancer. This medicine may be used for other purposes; ask your health care provider or pharmacist if you have questions. COMMON  BRAND NAME(S): Herceptin, Galvin Proffer, Trazimera What should I tell my care team before I take this medication? They need to know if you have any of these conditions: heart disease heart failure lung or breathing disease, like asthma an unusual or allergic reaction to trastuzumab,  benzyl alcohol, or other medications, foods, dyes, or preservatives pregnant or trying to get pregnant breast-feeding How should I use this medication? This drug is given as an infusion into a vein. It is administered in a hospital or clinic by a specially trained health care professional. Talk to your pediatrician regarding the use of this medicine in children. This medicine is not approved for use in children. Overdosage: If you think you have taken too much of this medicine contact a poison control center or emergency room at once. NOTE: This medicine is only for you. Do not share this medicine with others. What if I miss a dose? It is important not to miss a dose. Call your doctor or health care professional if you are unable to keep an appointment. What may interact with this medication? This medicine may interact with the following medications: certain types of chemotherapy, such as daunorubicin, doxorubicin, epirubicin, and idarubicin This list may not describe all possible interactions. Give your health care provider a list of all the medicines, herbs, non-prescription drugs, or dietary supplements you use. Also tell them if you smoke, drink alcohol, or use illegal drugs. Some items may interact with your medicine. What should I watch for while using this medication? Visit your doctor for checks on your progress. Report any side effects. Continue your course of treatment even though you feel ill unless your doctor tells you to stop. Call your doctor or health care professional for advice if you get a fever, chills or sore throat, or other symptoms of a cold or flu. Do not treat yourself. Try to avoid being around people who are sick. You may experience fever, chills and shaking during your first infusion. These effects are usually mild and can be treated with other medicines. Report any side effects during the infusion to your health care professional. Fever and chills usually do not happen  with later infusions. Do not become pregnant while taking this medicine or for 7 months after stopping it. Women should inform their doctor if they wish to become pregnant or think they might be pregnant. Women of child-bearing potential will need to have a negative pregnancy test before starting this medicine. There is a potential for serious side effects to an unborn child. Talk to your health care professional or pharmacist for more information. Do not breast-feed an infant while taking this medicine or for 7 months after stopping it. Women must use effective birth control with this medicine. What side effects may I notice from receiving this medication? Side effects that you should report to your doctor or health care professional as soon as possible: allergic reactions like skin rash, itching or hives, swelling of the face, lips, or tongue chest pain or palpitations cough dizziness feeling faint or lightheaded, falls fever general ill feeling or flu-like symptoms signs of worsening heart failure like breathing problems; swelling in your legs and feet unusually weak or tired Side effects that usually do not require medical attention (report to your doctor or health care professional if they continue or are bothersome): bone pain changes in taste diarrhea joint pain nausea/vomiting weight loss This list may not describe all possible side effects. Call your doctor for medical advice about  side effects. You may report side effects to FDA at 1-800-FDA-1088. Where should I keep my medication? This drug is given in a hospital or clinic and will not be stored at home. NOTE: This sheet is a summary. It may not cover all possible information. If you have questions about this medicine, talk to your doctor, pharmacist, or health care provider.  2022 Elsevier/Gold Standard (2016-09-23 14:37:52)   Pertuzumab injection What is this medication? PERTUZUMAB (per TOOZ ue mab) is a monoclonal  antibody. It is used to treat breast cancer. This medicine may be used for other purposes; ask your health care provider or pharmacist if you have questions. COMMON BRAND NAME(S): PERJETA What should I tell my care team before I take this medication? They need to know if you have any of these conditions: heart disease heart failure high blood pressure history of irregular heart beat recent or ongoing radiation therapy an unusual or allergic reaction to pertuzumab, other medicines, foods, dyes, or preservatives pregnant or trying to get pregnant breast-feeding How should I use this medication? This medicine is for infusion into a vein. It is given by a health care professional in a hospital or clinic setting. Talk to your pediatrician regarding the use of this medicine in children. Special care may be needed. Overdosage: If you think you have taken too much of this medicine contact a poison control center or emergency room at once. NOTE: This medicine is only for you. Do not share this medicine with others. What if I miss a dose? It is important not to miss your dose. Call your doctor or health care professional if you are unable to keep an appointment. What may interact with this medication? Interactions are not expected. Give your health care provider a list of all the medicines, herbs, non-prescription drugs, or dietary supplements you use. Also tell them if you smoke, drink alcohol, or use illegal drugs. Some items may interact with your medicine. This list may not describe all possible interactions. Give your health care provider a list of all the medicines, herbs, non-prescription drugs, or dietary supplements you use. Also tell them if you smoke, drink alcohol, or use illegal drugs. Some items may interact with your medicine. What should I watch for while using this medication? Your condition will be monitored carefully while you are receiving this medicine. Report any side effects.  Continue your course of treatment even though you feel ill unless your doctor tells you to stop. Do not become pregnant while taking this medicine or for 7 months after stopping it. Women should inform their doctor if they wish to become pregnant or think they might be pregnant. Women of child-bearing potential will need to have a negative pregnancy test before starting this medicine. There is a potential for serious side effects to an unborn child. Talk to your health care professional or pharmacist for more information. Do not breast-feed an infant while taking this medicine or for 7 months after stopping it. Women must use effective birth control with this medicine. Call your doctor or health care professional for advice if you get a fever, chills or sore throat, or other symptoms of a cold or flu. Do not treat yourself. Try to avoid being around people who are sick. You may experience fever, chills, and headache during the infusion. Report any side effects during the infusion to your health care professional. What side effects may I notice from receiving this medication? Side effects that you should report to your doctor or  health care professional as soon as possible: breathing problems chest pain or palpitations dizziness feeling faint or lightheaded fever or chills skin rash, itching or hives sore throat swelling of the face, lips, or tongue swelling of the legs or ankles unusually weak or tired Side effects that usually do not require medical attention (report to your doctor or health care professional if they continue or are bothersome): diarrhea hair loss nausea, vomiting tiredness This list may not describe all possible side effects. Call your doctor for medical advice about side effects. You may report side effects to FDA at 1-800-FDA-1088. Where should I keep my medication? This drug is given in a hospital or clinic and will not be stored at home. NOTE: This sheet is a summary.  It may not cover all possible information. If you have questions about this medicine, talk to your doctor, pharmacist, or health care provider.  2022 Elsevier/Gold Standard (2015-11-01 12:08:50)

## 2021-07-01 NOTE — Progress Notes (Signed)
Radiation Oncology         [phone] ________________________________  Name: Maria Kennedy MRN: [id number]  Date: 07/02/2021  DOB: May 09, 1971  Follow-Up Visit Note - Mychart Video Visit  Outpatient  CC: London Pepper, MD  Nicholas Lose, MD  Diagnosis:      ICD-10-CM   1. Malignant neoplasm of upper-outer quadrant of left breast in female, estrogen receptor negative [facility])  C50.412    Z17.1       Left mastectomy performed on 06/04/21: no residual carcinoma identified status post neoadjuvant therapy   Cancer Staging Malignant neoplasm of upper-outer quadrant of left breast in female, estrogen receptor negative ([place]) Staging form: Breast, AJCC 8th Edition - Clinical stage from 12/26/2020: Stage IIIA (cT3, cN1, cM0, G3, ER-, PR-, HER2+) - Signed by Nicholas Lose, MD on 12/26/2020 Stage prefix: Initial diagnosis Histologic grading system: 3 grade system  [id number]  CHIEF COMPLAINT: Here to discuss management of left breast cancer  Narrative:  The patient returns today for follow-up.    Since breast clinic consultation on 12/26/20, the patient received genetic testing (collected) on 12/26/20 which revealed no pathogenic variants detected on the [facility] CancerNext-Expanded + RNAinsight panel.   Pertinent imaging since then includes (dates and results as follows):  --Whole body bone scan on 01/01/21 which demonstrated no scintigraphic evidence of osseous metastatic disease, as well as subtle extraosseous radiotracer activity within the soft tissues of the bilateral breasts, noted as likely corresponding to known malignancy and/or prior biopsy site.  --CT of the chest, abdomen and pelvis on 01/03/21 demonstrated 3.5 cm left breast mass with enlarged left axillary lymph nodes; consistent with metastatic disease. No findings of metastatic disease were otherwise seen involving the chest, abdomen, pelvis, or osseous structures. Enlarged fibroid uterus was also seen. --Bilateral breast MRI on  04/14/21 which demonstrated persistent non mass enhancement throughout the upper-outer quadrant of th eleft breast, similar in distribution compared to previous exam. However, markedly less enhancement and mass-like appearance was seen in respects to the abnormality. Significant improvement was also seen to the left axillary adenopathy. No enlarged axillary lymph nodes were otherwise identified. Right breast seen to remain negative for metastasis.     Systemic therapy, if applicable, involved (dates and therapy as follows): Neoadjuvant chemotherapy with TCH Perjeta 6 cycles followed by Herceptin Perjeta maintenance versus Kadcyla maintenance (based on response to neoadjuvant chemo) for 1 year. First dose on 01/08/21 under the care of Dr. Lindi Adie.   The patient opted to proceed with left breast mastectomy and Left axillary radioactive seed guided node excision with  Left axillary sentinel lymph node biopsy on the date of 06/04/21. Pathology revealed: no residual carcinoma status post neoadjuvant therapy, focal usual ductal hyperplasia, adenosis, diffuse hyperplasia, and pseudoangiomatous stromal hyperplasia. Left axillary lymph node biopsy revealed no carcinoma identified.    Symptomatically, the patient reports: Healing well from surgery.  She has good range of motion in her arm and is about to start physical therapy.  She works from home and hopes to be able to work during radiation therapy.  Dr. Iran Planas placed an expander at the time of surgery and I have verified that this has been fully expanded after conferring today with Dr. Iran Planas.         ALLERGIES:  is allergic to latex.  Meds: Current Outpatient Medications  Medication Sig Dispense Refill   acetaminophen (TYLENOL) 650 MG CR tablet Take 650 mg by mouth every 8 (eight) hours as needed for pain.     albuterol (  VENTOLIN HFA) 108 (90 Base) MCG/ACT inhaler Inhale 2 puffs into the lungs every 6 (six) hours as needed for wheezing or  shortness of breath.     amphetamine-dextroamphetamine (ADDERALL XR) 20 MG 24 hr capsule Take 20 mg by mouth daily as needed.     LORazepam (ATIVAN) 0.5 MG tablet Take 1 tablet (0.5 mg total) by mouth at bedtime. 30 tablet 3   Menaquinone-7 (VITAMIN K2) 40 MCG TABS Take 2 tablets by mouth daily. 80 mcg daily total     VITAMIN D PO Take 5,000 Int'l Units by mouth every other day.     No current facility-administered medications for this encounter.    Physical Findings:  vitals were not taken for this visit. .     General: Alert and oriented, in no acute distress.  Lab Findings: Lab Results  Component Value Date   WBC 5.5 05/14/2021   HGB 10.2 (L) 05/14/2021   HCT 32.1 (L) 05/14/2021   MCV 96.1 05/14/2021   PLT 305 05/14/2021    @LASTCHEMISTRY @  Radiographic Findings: NM Sentinel Node Inj-No Rpt (Breast)  Result Date: 06/04/2021 Sulfur Colloid was injected by the Nuclear Medicine Technologist for sentinel lymph node localization.   MM Breast Surgical Specimen  Result Date: 06/04/2021 CLINICAL DATA:  Patient status post targeted left axillary lymph node dissection. EXAM: SPECIMEN RADIOGRAPH OF THE LEFT AXILLA COMPARISON:  Previous exam(s). FINDINGS: Status post excision of the left axilla. The radioactive seed is present and completely intact. The biopsy marking clip is not identified in the specimen. IMPRESSION: Specimen radiograph of the left axilla. Electronically Signed   By: Lovey Newcomer M.D.   On: 06/04/2021 10:41  [place] LT RADIOACTIVE SEED LOC  Result Date: 06/03/2021 CLINICAL DATA:  Patient is scheduled for LEFT axillary targeted lymph node dissection requiring preoperative radioactive seed localization. EXAM: ULTRASOUND GUIDED RADIOACTIVE SEED LOCALIZATION OF THE LEFT AXILLA COMPARISON:  Previous exam(s). FINDINGS: Patient presents for radioactive seed localization prior to targeted lymph node dissection. I met with the patient and we discussed the procedure of seed  localization including benefits and alternatives. We discussed the high likelihood of a successful procedure. We discussed the risks of the procedure including infection, bleeding, tissue injury and further surgery. We discussed the low dose of radioactivity involved in the procedure. Informed, written consent was given. The usual time-out protocol was performed immediately prior to the procedure. Using ultrasound guidance, sterile technique, 1% lidocaine and an I-125 radioactive seed, previously biopsied lymph node in the LEFT axilla and containing a biopsy clip was localized using a lateral approach. The follow-up mammogram images confirm the seed in the expected location and were marked for Dr. Donne Hazel. Follow-up survey of the patient confirms presence of the radioactive seed. Order number of I-125 seed:  [id number]. Total activity:  8.850 millicuries reference Date: 05/20/2021 The patient tolerated the procedure well and was released from the [place]. She was given instructions regarding seed removal. IMPRESSION: Radioactive seed localization left breast. No apparent complications. Electronically Signed   By: Franki Cabot M.D.   On: 06/03/2021 13:38  MM CLIP PLACEMENT LEFT  Result Date: 06/03/2021 CLINICAL DATA:  Status post ultrasound-guided placement of a radioactive seed into a LEFT axillary lymph node for scheduled targeted lymph node dissection. EXAM: 3D DIAGNOSTIC LEFT MAMMOGRAM POST ULTRASOUND-GUIDED RADIOACTIVE SEED PLACEMENT COMPARISON:  Previous exam(s). FINDINGS: 3D Mammographic images were obtained following ultrasound guided radioactive seed localization of of a LEFT axillary lymph node. The biopsy marking clip is in expected position  at the site of biopsy. IMPRESSION: Appropriate positioning of the radioactive seed within the previously biopsied LEFT axillary lymph node that contains a Tribell biopsy marking clip. Final Assessment: Post Procedure Mammograms for Marker Placement  Electronically Signed   By: Franki Cabot M.D.   On: 06/03/2021 13:38   Impression/Plan: This is a wonderful 50 year old woman with a history of locally advanced left breast cancer with a complete pathologic response to chemotherapy, postmastectomy  We discussed adjuvant radiotherapy today.  I recommend postmastectomy radiation to the left chest wall and regional nodes, 28 fractions in total, in order to reduce risk of local regional recurrence.  We discussed the paucity of data to demonstrate that radiation can safely be spared from patients with a complete pathologic response to systemic therapy neoadjuvantly.  Based on the available data and the locally advanced nature of her disease prior to systemic therapy, I recommend she pursue treatment.  She is in agreement.  I reviewed the logistics, benefits, risks, and potential side effects of this treatment in detail. Risks may include but not necessary be limited to acute and late injury tissue in the radiation fields such as skin irritation (change in color/pigmentation, itching, dryness, pain, peeling). She may experience fatigue. We also discussed possible risk of long term cosmetic changes or scar tissue. There is also a risk of complications related to her reconstructive surgery and a risk for lung toxicity, cardiac toxicity, brachial plexopathy, lymphedema, musculoskeletal changes, rib fragility or induction of a second malignancy, late chronic non-healing soft tissue wound.    The patient asked good questions which I answered to her satisfaction. She is enthusiastic about proceeding with treatment.  She is scheduled for simulation at the end of this week.  This encounter was provided by telemedicine platform; patient desired telemedicine during pandemic precautions.  MyChart video was used. The patient has given verbal consent for this type of encounter and has been advised to only accept a meeting of this type in a secure network environment. On  date of service, in total, I spent 35 minutes on this encounter.   The attendants for this meeting include Eppie Gibson  and Mariann Barter During the encounter, Eppie Gibson was located at [facility] Radiation Oncology Department.  Aelyn Kovarik was located at home.     _____________________________________   Eppie Gibson, MD  This document serves as a record of services personally performed by Eppie Gibson, MD. It was created on her behalf by Roney Mans, a trained medical scribe. The creation of this record is based on the scribe's personal observations and the provider's statements to them. This document has been checked and approved by the attending provider.

## 2021-07-01 NOTE — Progress Notes (Signed)
Location of Breast Cancer:  Malignant neoplasm of upper-outer quadrant of LEFT breast, estrogen receptor negative   Histology per Pathology Report:  06/04/2021 FINAL MICROSCOPIC DIAGNOSIS:  A. BREAST, LEFT, MASTECTOMY:  -  No residual carcinoma status post neoadjuvant therapy  -  Focal usual ductal hyperplasia, adenosis and diffuse fibroadenomatoid change  -  Pseudoangiomatous stromal hyperplasia  -  See comment  B. LYMPH NODE, LEFT AXILLARY, EXCISION:  -  Benign fibroadipose tissue  -  No nodal tissue identified  C. LYMPH NODES, LEFT AXILLARY CONTENTS:  -  No carcinoma identified in one lymph node (0/1)  -  Treatment related changes  -  See comment  COMMENT:  A.  Cytokeratin 5/6 shows strong diffuse staining in the focus of usual ductal hyperplasia.  Immunohistochemistry (cytokeratin 5/6, cytokeratin  AE1/3, SMM and calponin) was performed on a select block to rule out invasive carcinoma.  Based on the immunohistochemistry, the findings are more consistent with adenosis C.  Cytokeratin AE1/3 does not highlight an infiltrative epithelial component, supporting absence of metastasis.  Receptor Status: ER(0%), PR (0%), Her2-neu (Positive), Ki-67(70%)  Did patient present with symptoms (if so, please note symptoms) or was this found on screening mammography?: Patient palpated a left breast mass and noted left axilla tenderness for one week. Diagnostic mammogram and US showed a conglomeration of masses in the left breast at the 2 o'clock position 3-4cm from the nipple spanning 3.9cm total, with a 0.7cm mass at the 2 o'clock position 6cm from the nipple, and two abnormal axillary lymph nodes  Past/Anticipated interventions by surgeon, if any:  06/04/2021 --Dr. Rolm Bookbinder  1.  Left skin sparing mastectomy 2.  Left axillary radioactive seed guided node excision 3.  Left axillary sentinel lymph node biopsy 4.  Injection of tracer for sentinel lymph node identification --Dr. Irene Limbo (plastic surgeon) 1. Left breast reconstruction with tissue expander  2. 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 Nicholas Lose 06/13/2021 --Treatment Plan: Neoadjuvant chemotherapy with TCH Perjeta 6 cycles (completed 04/23/21) followed by Herceptin Perjeta maintenance Left mastectomy 06/04/2021: Dr. Donne Hazel: Complete pathologic response, no residual cancer, 0/1 lymph node negative Followed by adjuvant radiation therapy --Based on complete pathologic response, recommended continuation of Herceptin and Perjeta maintenance. --Return to clinic to start back on Herceptin and Perjeta and every 6 weeks for follow-up with me  Lymphedema issues, if any:  Patient denies but is scheduled for PT evaluation/assessment on 07/09/2021  Pain issues, if any:  Some pain/tightness with range of motion to her left arm, but reports it's manageable.  SAFETY ISSUES: Prior radiation? No Pacemaker/ICD? No Possible current pregnancy? No--has not had a menstrual cycle since starting chemotherapy Is the patient on methotrexate? No  Current Complaints / other details:  Nothing else of note

## 2021-07-02 ENCOUNTER — Other Ambulatory Visit: Payer: Self-pay

## 2021-07-02 ENCOUNTER — Ambulatory Visit
Admission: RE | Admit: 2021-07-02 | Discharge: 2021-07-02 | Disposition: A | Discharge status: Home / Self Care | Payer: 59 | Source: Ambulatory Visit | Attending: Radiation Oncology | Admitting: Radiation Oncology

## 2021-07-02 ENCOUNTER — Encounter: Payer: Self-pay | Admitting: Radiation Oncology

## 2021-07-02 DIAGNOSIS — Z171 Estrogen receptor negative status [ER-]: Secondary | ICD-10-CM

## 2021-07-02 DIAGNOSIS — C50412 Malignant neoplasm of upper-outer quadrant of left female breast: Secondary | ICD-10-CM

## 2021-07-05 ENCOUNTER — Ambulatory Visit
Admission: RE | Admit: 2021-07-05 | Discharge: 2021-07-05 | Disposition: A | Discharge status: Home / Self Care | Payer: 59 | Source: Ambulatory Visit | Attending: Radiation Oncology | Admitting: Radiation Oncology

## 2021-07-05 ENCOUNTER — Other Ambulatory Visit: Payer: Self-pay

## 2021-07-05 DIAGNOSIS — Z510 Encounter for antineoplastic radiation therapy: Secondary | ICD-10-CM | POA: Diagnosis not present

## 2021-07-05 DIAGNOSIS — C50412 Malignant neoplasm of upper-outer quadrant of left female breast: Secondary | ICD-10-CM

## 2021-07-05 DIAGNOSIS — Z171 Estrogen receptor negative status [ER-]: Secondary | ICD-10-CM

## 2021-07-05 LAB — PREGNANCY, URINE: Preg Test, Ur: NEGATIVE

## 2021-07-09 ENCOUNTER — Ambulatory Visit: Payer: 59 | Attending: Plastic Surgery | Admitting: Rehabilitation

## 2021-07-09 ENCOUNTER — Encounter: Payer: Self-pay | Admitting: Rehabilitation

## 2021-07-09 ENCOUNTER — Other Ambulatory Visit: Payer: Self-pay

## 2021-07-09 DIAGNOSIS — M25612 Stiffness of left shoulder, not elsewhere classified: Secondary | ICD-10-CM | POA: Insufficient documentation

## 2021-07-09 DIAGNOSIS — R42 Dizziness and giddiness: Secondary | ICD-10-CM | POA: Insufficient documentation

## 2021-07-09 DIAGNOSIS — Z510 Encounter for antineoplastic radiation therapy: Secondary | ICD-10-CM | POA: Diagnosis not present

## 2021-07-09 DIAGNOSIS — Z483 Aftercare following surgery for neoplasm: Secondary | ICD-10-CM | POA: Insufficient documentation

## 2021-07-09 NOTE — Therapy (Signed)
[place], [name], [address] Phone: [phone]   Fax:  [phone]  Physical Therapy Evaluation  Patient Details  Name: Maria Kennedy MRN: [id number] Date of Birth: 1971-07-27 Referring Provider (PT): Dr Iran Planas   Encounter Date: 07/09/2021   PT End of Session - 07/09/21 0949     Visit Number 1    Number of Visits 9    Date for PT Re-Evaluation 09/03/21    PT Start Time 0900    PT Stop Time 0943    PT Time Calculation (min) 43 min    Activity Tolerance Patient tolerated treatment well    Behavior During Therapy [facility] for tasks assessed/performed             Past Medical History:  Diagnosis Date   ADHD (attention deficit hyperactivity disorder)    Asthma    Cancer ([place])    breast ca   Depression    Family history of breast cancer    Family history of leukemia    Family history of lung cancer    Family history of pancreatic cancer     Past Surgical History:  Procedure Laterality Date   BREAST RECONSTRUCTION WITH PLACEMENT OF TISSUE EXPANDER AND ALLODERM Left 06/04/2021   Procedure: BREAST RECONSTRUCTION WITH PLACEMENT OF TISSUE EXPANDER AND ALLODERM;  Surgeon: Irene Limbo, MD;  Location: [place];  Service: Plastics;  Laterality: Left;   MASTECTOMY WITH RADIOACTIVE SEED GUIDED EXCISION AND AXILLARY SENTINEL LYMPH NODE BIOPSY Left 06/04/2021   Procedure: LEFT MASTECTOMY, LEFT AXILLARY SENTINEL LYMPH NODE BIOPSY, LEFT AXILLARY NODE SEED GUIDED EXCISION;  Surgeon: Rolm Bookbinder, MD;  Location: [place];  Service: General;  Laterality: Left;   PORTACATH PLACEMENT Right 01/07/2021   Procedure: INSERTION PORT-A-CATH;  Surgeon: Rolm Bookbinder, MD;  Location: [place];  Service: General;  Laterality: Right;   TUBAL LIGATION      There were no vitals filed for this visit.    Subjective Assessment - 07/09/21 0858      Subjective I am having tightness in the left armpit.  I already did my simulation    Pertinent History History of left mastectomy with expander and 0/1 lymph nodes positive on 06/04/21.  Completed neoadjuvant chemotherapy and will have radiation    Limitations Lifting    Patient Stated Goals get my motion back    Currently in Pain? No/denies                [facility] PT Assessment - 07/09/21 0001       Assessment   Medical Diagnosis Lt breast cancer    Referring Provider (PT) Dr Iran Planas    Onset Date/Surgical Date 06/04/21    Hand Dominance Left    Prior Therapy baselines      Precautions   Precaution Comments lymphedema risk on the left UE      Balance Screen   Has the patient fallen in the past 6 months No    Has the patient had a decrease in activity level because of a fear of falling?  No    Is the patient reluctant to leave their home because of a fear of falling?  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      Cognition   Overall Cognitive Status Within Functional Limits for tasks assessed      Observation/Other Assessments   Observations well healed incisions - expander in place sitting nicely, no edema noted in axilla but puffiness equal to the opposite side, 2-3 cords in the left antecubital fossa      ROM / Strength   AROM / PROM / Strength AROM;PROM      AROM   AROM Assessment Site Shoulder    Right/Left Shoulder Right;Left    Right Shoulder Flexion --   pull   Left Shoulder Extension 60 Degrees    Left Shoulder Flexion 147 Degrees   pull   Left Shoulder ABduction 100 Degrees   with pain   Left Shoulder External Rotation 89 Degrees      PROM   Overall PROM Comments  cording noted in supine PROM abduction and flexion with pull in mid upper arm               LYMPHEDEMA/ONCOLOGY QUESTIONNAIRE - 07/09/21 0001       Type   Cancer Type Left breast cancer      Surgeries   Mastectomy Date 06/04/21    Number Lymph Nodes Removed 1      Treatment   Active Chemotherapy Treatment No    Past Chemotherapy Treatment Yes    Active Radiation Treatment Yes    Past Hormone Therapy No      What other symptoms do you have   Are you Having Heaviness or Tightness No    Are you having Pain No      Lymphedema Assessments   Lymphedema Assessments Upper extremities      Left Upper Extremity Lymphedema   15 cm Proximal to Olecranon Process 33 cm    10 cm Proximal to Olecranon Process 24.5 cm    Olecranon Process 20 cm    Just Proximal to Ulnar Styloid Process 15.5 cm    Across Hand at [employer] 20.7 cm    At [place] of 2nd Digit 6.5 cm                     Objective measurements completed on examination: See above findings.       [place] Adult PT Treatment/Exercise - 07/09/21 0001       Exercises   Exercises Other Exercises    Other Exercises  performed each new HEP exercise x 1 with vcs and tcs as needed: dowel abduction, supine flexion AAROM, supine chest stretch, median nerve mobilization      Manual Therapy   Manual Therapy Myofascial release    Myofascial Release to the antecubital fossa on the left with cocoa butter gentle tension due to discomfort                     PT Education - 07/09/21 0949     Education Details cording, POC, HEP    Person(s) Educated Patient    Methods Explanation;Handout;Verbal cues;Tactile cues;Demonstration    Comprehension Verbalized understanding;Returned demonstration;Verbal cues required;Tactile cues required                 PT Long Term Goals - 07/09/21 0953       PT LONG TERM GOAL #1   Title Patient will demonstrate she has regained full shoulder ROM and function post  operatively  compared to baselines.    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  Pt will start radiation and return to work on Monday but is willing to do 1x per week for around 4 weeks to improve ROM and decrease cording pain.    Personal Factors and Comorbidities Comorbidity 2    Comorbidities SLNB, chemotherapy hx    Examination-Activity Limitations Reach Overhead    Examination-Participation Restrictions Community Activity    Stability/Clinical Decision Making Stable/Uncomplicated    Clinical Decision Making Low    Rehab Potential Excellent    PT Frequency 1x / week    PT Duration 8 weeks    PT Treatment/Interventions ADLs/Self Care Home Management;Patient/family education;Manual techniques;Therapeutic exercise    PT Next Visit Plan PROM, AAROM, STM for Lt UE cording, discuss and give risk reduction handout, schedule SOZO around 09/04/21    PT Home Exercise Plan Access Code: [email or web address]    Consulted and Agree with Plan of Care Patient             Patient will benefit from skilled therapeutic intervention in order to improve the following deficits and impairments:  Decreased skin integrity, Impaired UE functional use  Visit Diagnosis: Aftercare following surgery for neoplasm  Stiffness of left shoulder, not elsewhere  classified     Problem List Patient Active Problem List   Diagnosis Date Noted   S/P mastectomy, left 06/04/2021   Genetic testing 01/18/2021   Port-A-Cath in place 01/15/2021   Family history of breast cancer    Family history of lung cancer    Family history of pancreatic cancer    Family history of leukemia    Malignant neoplasm of upper-outer quadrant of left breast in female, estrogen receptor negative ([place]) 12/24/2020   Benign paroxysmal positional vertigo of left ear 11/15/2018   ADD (attention deficit disorder) 08/11/2011    Stark Bray, PT 07/09/2021, 9:55 AM  [place], [name], [address] Phone: [phone]   Fax:  [phone]  Name: Maria Kennedy MRN: [id number] Date of Birth: November 27, 1970

## 2021-07-09 NOTE — Patient Instructions (Addendum)
Access Code: [email or web address]: [email or web address]: 09/27/2022Prepared by: Marcene Brawn TevisExercises  Standing Shoulder Abduction ROM with Dowel - 1 x daily - 7 x weekly - 1 sets - 10 reps - 3 second hold  Standing Median Nerve Glide - 1 x daily - 7 x weekly - 1 sets - 10 reps - no hold  Supine Shoulder Flexion AAROM with Hands Clasped - 1 x daily - 7 x weekly - 1-3 sets - 10 reps - 3 seconds hold  Supine Chest Stretch with Elbows Bent - 1 x daily - 7 x weekly - 1 sets - 3 reps - 6 seconds hold   Axillary web syndrome (also called cording) can happen after having breast cancer surgery when lymph nodes in the armpit are removed. It presents as if you have a thin cord in your arm and can run from the armpit all the way down into the forearm. If you've had a sentinel node biopsy, the risk is 1-20% and if you've had an axillary lymph node dissection (more than 7 nodes removed), the risk is 36-72%. The ranges vary depending on the research study.  It most often happens 3-4 weeks post-op but can happen sooner or later. There are several possibilities for what cording actually is. Although no one knows for sure as of yet, it may be related to lymphatics, veins, or other tissue. Sometimes cording resolves on its own but other times it requires physical therapy with a therapist who specializes in lymphedema and/or cancer rehab. Treatment typically involves stretching, manual techniques, and exercise. Sometimes cords get "released" while stretching or during manual treatment and the patient may experience the sensation of a "pop." This may feel strange but it is not dangerous and is a sign that the cord has released; range of motion may be improved in the process.

## 2021-07-12 ENCOUNTER — Other Ambulatory Visit: Payer: Self-pay

## 2021-07-12 ENCOUNTER — Encounter: Payer: Self-pay | Admitting: *Deleted

## 2021-07-12 ENCOUNTER — Ambulatory Visit: Admission: RE | Admit: 2021-07-12 | Payer: 59 | Source: Ambulatory Visit | Admitting: Radiation Oncology

## 2021-07-12 ENCOUNTER — Inpatient Hospital Stay: Payer: 59

## 2021-07-12 VITALS — BP 124/96 | HR 80 | Temp 98.1°F | Resp 17 | Wt 175.0 lb

## 2021-07-12 DIAGNOSIS — C50412 Malignant neoplasm of upper-outer quadrant of left female breast: Secondary | ICD-10-CM

## 2021-07-12 DIAGNOSIS — Z510 Encounter for antineoplastic radiation therapy: Secondary | ICD-10-CM | POA: Diagnosis not present

## 2021-07-12 MED ORDER — SODIUM CHLORIDE 0.9% FLUSH
10.0000 mL | INTRAVENOUS | Status: DC | PRN
  Administered 2021-07-12: 10 mL

## 2021-07-12 MED ORDER — ACETAMINOPHEN 325 MG PO TABS
650.0000 mg | ORAL_TABLET | Freq: Once | ORAL | Status: AC
  Administered 2021-07-12: 650 mg via ORAL
  Filled 2021-07-12: qty 2

## 2021-07-12 MED ORDER — DIPHENHYDRAMINE HCL 25 MG PO CAPS
25.0000 mg | ORAL_CAPSULE | Freq: Once | ORAL | Status: AC
  Administered 2021-07-12: 25 mg via ORAL
  Filled 2021-07-12: qty 1

## 2021-07-12 MED ORDER — SODIUM CHLORIDE 0.9 % IV SOLN: Freq: Once | INTRAVENOUS | Status: AC

## 2021-07-12 MED ORDER — SODIUM CHLORIDE 0.9 % IV SOLN
6.0000 mg/kg | Freq: Once | INTRAVENOUS | Status: AC
  Administered 2021-07-12: 462 mg via INTRAVENOUS
  Filled 2021-07-12: qty 22

## 2021-07-12 MED ORDER — SODIUM CHLORIDE 0.9 % IV SOLN
420.0000 mg | Freq: Once | INTRAVENOUS | Status: AC
  Administered 2021-07-12: 420 mg via INTRAVENOUS
  Filled 2021-07-12: qty 14

## 2021-07-12 MED ORDER — HEPARIN SOD (PORK) LOCK FLUSH 100 UNIT/ML IV SOLN
500.0000 [IU] | Freq: Once | INTRAVENOUS | Status: AC | PRN
  Administered 2021-07-12: 500 [IU]

## 2021-07-12 NOTE — Patient Instructions (Signed)
[facility] ONCOLOGY  Discharge Instructions: Thank you for choosing [facility] to provide your oncology and hematology care.   If you have a lab appointment with the [facility], please go directly to the [facility] and check in at the registration area.   Wear comfortable clothing and clothing appropriate for easy access to any Portacath or PICC line.   We strive to give you quality time with your provider. You may need to reschedule your appointment if you arrive late (15 or more minutes).  Arriving late affects you and other patients whose appointments are after yours.  Also, if you miss three or more appointments without notifying the office, you may be dismissed from the clinic at the provider's discretion.      For prescription refill requests, have your pharmacy contact our office and allow 72 hours for refills to be completed.    Today you received the following chemotherapy and/or immunotherapy agents: trastuzumab, pertuzumab   To help prevent nausea and vomiting after your treatment, we encourage you to take your nausea medication as directed.  BELOW ARE SYMPTOMS THAT SHOULD BE REPORTED IMMEDIATELY: *FEVER GREATER THAN 100.4 F (38 C) OR HIGHER *CHILLS OR SWEATING *NAUSEA AND VOMITING THAT IS NOT CONTROLLED WITH YOUR NAUSEA MEDICATION *UNUSUAL SHORTNESS OF BREATH *UNUSUAL BRUISING OR BLEEDING *URINARY PROBLEMS (pain or burning when urinating, or frequent urination) *BOWEL PROBLEMS (unusual diarrhea, constipation, pain near the anus) TENDERNESS IN MOUTH AND THROAT WITH OR WITHOUT PRESENCE OF ULCERS (sore throat, sores in mouth, or a toothache) UNUSUAL RASH, SWELLING OR PAIN  UNUSUAL VAGINAL DISCHARGE OR ITCHING   Items with * indicate a potential emergency and should be followed up as soon as possible or go to the Emergency Department if any problems should occur.  Please show the CHEMOTHERAPY ALERT CARD or IMMUNOTHERAPY ALERT CARD at  check-in to the Emergency Department and triage nurse.  Should you have questions after your visit or need to cancel or reschedule your appointment, please contact [facility]  Dept: [phone]  and follow the prompts.  Office hours are 8:00 a.m. to 4:30 p.m. Monday - Friday. Please note that voicemails left after 4:00 p.m. may not be returned until the following business day.  We are closed weekends and major holidays. You have access to a nurse at all times for urgent questions. Please call the main number to the clinic Dept: [phone] and follow the prompts.   For any non-urgent questions, you may also contact your provider using MyChart. We now offer e-Visits for anyone 29 and older to request care online for non-urgent symptoms. For details visit [email or web address].   Also download the MyChart app! Go to the app store, search "MyChart", open the app, select [facility], and log in with your MyChart username and password.  Due to Covid, a mask is required upon entering the hospital/clinic. If you do not have a mask, one will be given to you upon arrival. For doctor visits, patients may have 1 support person aged 1 or older with them. For treatment visits, patients cannot have anyone with them due to current Covid guidelines and our immunocompromised population.

## 2021-07-15 ENCOUNTER — Encounter: Payer: Self-pay | Admitting: Hematology and Oncology

## 2021-07-15 ENCOUNTER — Ambulatory Visit: Payer: 59 | Admitting: Radiation Oncology

## 2021-07-15 ENCOUNTER — Other Ambulatory Visit: Payer: Self-pay

## 2021-07-15 ENCOUNTER — Ambulatory Visit: Admission: RE | Admit: 2021-07-15 | Payer: 59 | Source: Ambulatory Visit

## 2021-07-16 ENCOUNTER — Ambulatory Visit: Payer: 59

## 2021-07-16 ENCOUNTER — Ambulatory Visit
Admission: RE | Admit: 2021-07-16 | Discharge: 2021-07-16 | Disposition: A | Discharge status: Home / Self Care | Payer: 59 | Source: Ambulatory Visit | Attending: Radiation Oncology | Admitting: Radiation Oncology

## 2021-07-16 DIAGNOSIS — Z79899 Other long term (current) drug therapy: Secondary | ICD-10-CM | POA: Diagnosis not present

## 2021-07-16 DIAGNOSIS — Z23 Encounter for immunization: Secondary | ICD-10-CM | POA: Diagnosis not present

## 2021-07-16 DIAGNOSIS — Z171 Estrogen receptor negative status [ER-]: Secondary | ICD-10-CM | POA: Insufficient documentation

## 2021-07-16 DIAGNOSIS — M25612 Stiffness of left shoulder, not elsewhere classified: Secondary | ICD-10-CM | POA: Diagnosis not present

## 2021-07-16 DIAGNOSIS — R42 Dizziness and giddiness: Secondary | ICD-10-CM | POA: Diagnosis not present

## 2021-07-16 DIAGNOSIS — M25512 Pain in left shoulder: Secondary | ICD-10-CM | POA: Diagnosis not present

## 2021-07-16 DIAGNOSIS — C773 Secondary and unspecified malignant neoplasm of axilla and upper limb lymph nodes: Secondary | ICD-10-CM | POA: Diagnosis present

## 2021-07-16 DIAGNOSIS — C50412 Malignant neoplasm of upper-outer quadrant of left female breast: Secondary | ICD-10-CM

## 2021-07-16 DIAGNOSIS — R293 Abnormal posture: Secondary | ICD-10-CM | POA: Diagnosis not present

## 2021-07-16 DIAGNOSIS — Z5112 Encounter for antineoplastic immunotherapy: Secondary | ICD-10-CM | POA: Diagnosis present

## 2021-07-16 MED ORDER — ALRA NON-METALLIC DEODORANT (RAD-ONC)
1.0000 "application " | Freq: Once | TOPICAL | Status: AC
  Administered 2021-07-16: 1 via TOPICAL

## 2021-07-16 MED ORDER — RADIAPLEXRX EX GEL: Freq: Once | CUTANEOUS | Status: AC

## 2021-07-16 NOTE — Progress Notes (Signed)
Pt here for patient teaching.    Pt given Radiation and You booklet, skin care instructions, Alra deodorant, and Radiaplex gel.    Reviewed areas of pertinence such as fatigue, hair loss, skin changes, breast tenderness, breast swelling, and taste changes .   Pt able to give teach back of to pat skin and use unscented/gentle soap,apply Radiaplex bid, avoid applying anything to skin within 4 hours of treatment, avoid wearing an under wire bra, and to use an electric razor if they must shave.   Pt verbalizes understanding of information given and will contact nursing with any questions or concerns.    [email or web address]

## 2021-07-17 ENCOUNTER — Ambulatory Visit
Admission: RE | Admit: 2021-07-17 | Discharge: 2021-07-17 | Disposition: A | Discharge status: Home / Self Care | Payer: 59 | Source: Ambulatory Visit | Attending: Radiation Oncology | Admitting: Radiation Oncology

## 2021-07-17 ENCOUNTER — Other Ambulatory Visit: Payer: Self-pay

## 2021-07-17 DIAGNOSIS — Z5112 Encounter for antineoplastic immunotherapy: Secondary | ICD-10-CM | POA: Diagnosis not present

## 2021-07-18 ENCOUNTER — Ambulatory Visit
Admission: RE | Admit: 2021-07-18 | Discharge: 2021-07-18 | Disposition: A | Discharge status: Home / Self Care | Payer: 59 | Source: Ambulatory Visit | Attending: Radiation Oncology | Admitting: Radiation Oncology

## 2021-07-18 ENCOUNTER — Ambulatory Visit: Payer: 59 | Admitting: Physical Therapy

## 2021-07-18 DIAGNOSIS — Z171 Estrogen receptor negative status [ER-]: Secondary | ICD-10-CM | POA: Insufficient documentation

## 2021-07-18 DIAGNOSIS — R42 Dizziness and giddiness: Secondary | ICD-10-CM

## 2021-07-18 DIAGNOSIS — Z5112 Encounter for antineoplastic immunotherapy: Secondary | ICD-10-CM | POA: Diagnosis not present

## 2021-07-18 DIAGNOSIS — Z483 Aftercare following surgery for neoplasm: Secondary | ICD-10-CM | POA: Insufficient documentation

## 2021-07-18 DIAGNOSIS — M25512 Pain in left shoulder: Secondary | ICD-10-CM

## 2021-07-18 DIAGNOSIS — R293 Abnormal posture: Secondary | ICD-10-CM | POA: Insufficient documentation

## 2021-07-18 DIAGNOSIS — M25612 Stiffness of left shoulder, not elsewhere classified: Secondary | ICD-10-CM | POA: Insufficient documentation

## 2021-07-18 DIAGNOSIS — C50412 Malignant neoplasm of upper-outer quadrant of left female breast: Secondary | ICD-10-CM | POA: Insufficient documentation

## 2021-07-18 NOTE — Therapy (Signed)
[facility] @ [place], [name], [address] Phone:     Fax:     Physical Therapy Treatment  Patient Details  Name: Zaya Kessenich MRN: [id number] Date of Birth: 11-28-1970 Referring Provider (PT): Dr Iran Planas   Encounter Date: 07/18/2021   PT End of Session - 07/18/21 1320     Visit Number 2    Number of Visits 9    Date for PT Re-Evaluation 09/03/21    PT Start Time 1115    PT Stop Time 1200    PT Time Calculation (min) 45 min    Activity Tolerance Patient tolerated treatment well    Behavior During Therapy [facility] for tasks assessed/performed             Past Medical History:  Diagnosis Date   ADHD (attention deficit hyperactivity disorder)    Asthma    Cancer ([place])    breast ca   Depression    Family history of breast cancer    Family history of leukemia    Family history of lung cancer    Family history of pancreatic cancer     Past Surgical History:  Procedure Laterality Date   BREAST RECONSTRUCTION WITH PLACEMENT OF TISSUE EXPANDER AND ALLODERM Left 06/04/2021   Procedure: BREAST RECONSTRUCTION WITH PLACEMENT OF TISSUE EXPANDER AND ALLODERM;  Surgeon: Irene Limbo, MD;  Location: [facility];  Service: Plastics;  Laterality: Left;   MASTECTOMY WITH RADIOACTIVE SEED GUIDED EXCISION AND AXILLARY SENTINEL LYMPH NODE BIOPSY Left 06/04/2021   Procedure: LEFT MASTECTOMY, LEFT AXILLARY SENTINEL LYMPH NODE BIOPSY, LEFT AXILLARY NODE SEED GUIDED EXCISION;  Surgeon: Rolm Bookbinder, MD;  Location: [facility];  Service: General;  Laterality: Left;   PORTACATH PLACEMENT Right 01/07/2021   Procedure: INSERTION PORT-A-CATH;  Surgeon: Rolm Bookbinder, MD;  Location: [facility];  Service: General;  Laterality: Right;   TUBAL LIGATION      There were no vitals filed for this visit.   Subjective Assessment - 07/18/21 1114     Subjective Pt states she had  radiation this morning and that is going ok.She is able to get into the radiatton position without difficulty  She says her cording is a little bit better    Pertinent History History of left mastectomy with expander and 0/1 lymph nodes positive on 06/04/21.  Completed neoadjuvant chemotherapy and will have radiation    Patient Stated Goals get my motion back    Currently in Pain? No/denies                [facility] PT Assessment - 07/18/21 0001       AROM   Left Shoulder Flexion 160 Degrees    Left Shoulder ABduction 135 Degrees                           [facility] Adult PT Treatment/Exercise - 07/18/21 0001       Exercises   Exercises Other Exercises    Other Exercises  meeks decompression      Manual Therapy   Manual Therapy Soft tissue mobilization    Soft tissue mobilization with cocoa butter to left forearm and upper arm. No cording palpated.                     PT Education - 07/18/21 1318     Education Details Meeks decompression exercises  Person(s) Educated Patient    Methods Explanation;Demonstration;Handout    Comprehension Verbalized understanding;Returned demonstration                 PT Long Term Goals - 07/09/21 0953       PT LONG TERM GOAL #1   Title Patient will demonstrate she has regained full shoulder ROM and function post operatively compared to baselines.    Time 8    Period Weeks    Status On-going      PT LONG TERM GOAL #2   Title Pt will be ind with final HEP for use during and after radiation    Time 8    Period Weeks    Status New      PT LONG TERM GOAL #3   Title Pt will be educated on risk reduction, lymphedema risk, and use of SOZO    Baseline paritally met - still needs risk reduction sheet    Time 8    Period Weeks    Status New                   Plan - 07/18/21 1320     Clinical Impression Statement Pt is doing very well. No cording manually or visually perceived today, but she  states she can still feels some pain. Pt with improved ROM that was even better after session.  Instructed in Meeks decompression and encouraged to continue doing shoulder ROM stretches she has been doing.  Encouraged her to exercise/walk 30 minutes a day while doing radiaton.  Talked about her putting PT "on hold" while doing radiation since she is making such good progress with ROM    Personal Factors and Comorbidities Comorbidity 2    Comorbidities SLNB, chemotherapy hx    Examination-Activity Limitations Reach Overhead    Examination-Participation Restrictions Community Activity    Stability/Clinical Decision Making Stable/Uncomplicated    Rehab Potential Excellent    PT Frequency 1x / week    PT Duration 8 weeks    PT Treatment/Interventions ADLs/Self Care Home Management;Patient/family education;Manual techniques;Therapeutic exercise;Visual/perceptual remediation/compensation;Canalith Repostioning    PT Next Visit Plan continue ROM and soft tissue work add strengthening.  consider putting PT on hold til after radiation completed ????    Consulted and Agree with Plan of Care Patient             Patient will benefit from skilled therapeutic intervention in order to improve the following deficits and impairments:  Decreased skin integrity, Impaired UE functional use, Dizziness  Visit Diagnosis: Aftercare following surgery for neoplasm  Stiffness of left shoulder, not elsewhere classified  Dizziness and giddiness  Malignant neoplasm of upper-outer quadrant of left breast in female, estrogen receptor negative (H[facility])  Abnormal posture  Acute pain of left shoulder     Problem List Patient Active Problem List   Diagnosis Date Noted   S/P mastectomy, left 06/04/2021   Genetic testing 01/18/2021   Port-A-Cath in place 01/15/2021   Family history of breast cancer    Family history of lung cancer    Family history of pancreatic cancer    Family history of leukemia    Malignant  neoplasm of upper-outer quadrant of left breast in female, estrogen receptor negative ([place]) 12/24/2020   Benign paroxysmal positional vertigo of left ear 11/15/2018   ADD (attention deficit disorder) 08/11/2011   Donato Heinz. Owens Shark PT  Norwood Levo, PT 07/18/2021, 1:25 PM  [facility] @ [place], [name], [address] Phone:     Fax:     Name: Elektra Wartman MRN: [id number] Date of Birth: 08-15-1971

## 2021-07-18 NOTE — Patient Instructions (Signed)

## 2021-07-19 ENCOUNTER — Ambulatory Visit
Admission: RE | Admit: 2021-07-19 | Discharge: 2021-07-19 | Disposition: A | Discharge status: Home / Self Care | Payer: 59 | Source: Ambulatory Visit | Attending: Radiation Oncology | Admitting: Radiation Oncology

## 2021-07-19 ENCOUNTER — Other Ambulatory Visit: Payer: Self-pay

## 2021-07-19 DIAGNOSIS — Z5112 Encounter for antineoplastic immunotherapy: Secondary | ICD-10-CM | POA: Diagnosis not present

## 2021-07-22 ENCOUNTER — Other Ambulatory Visit: Payer: Self-pay

## 2021-07-22 ENCOUNTER — Ambulatory Visit
Admission: RE | Admit: 2021-07-22 | Discharge: 2021-07-22 | Disposition: A | Discharge status: Home / Self Care | Payer: 59 | Source: Ambulatory Visit | Attending: Radiation Oncology | Admitting: Radiation Oncology

## 2021-07-22 DIAGNOSIS — Z5112 Encounter for antineoplastic immunotherapy: Secondary | ICD-10-CM | POA: Diagnosis not present

## 2021-07-23 ENCOUNTER — Encounter: Payer: Self-pay | Admitting: Hematology and Oncology

## 2021-07-23 ENCOUNTER — Ambulatory Visit
Admission: RE | Admit: 2021-07-23 | Discharge: 2021-07-23 | Disposition: A | Discharge status: Home / Self Care | Payer: 59 | Source: Ambulatory Visit | Attending: Radiation Oncology | Admitting: Radiation Oncology

## 2021-07-23 DIAGNOSIS — Z5112 Encounter for antineoplastic immunotherapy: Secondary | ICD-10-CM | POA: Diagnosis not present

## 2021-07-24 ENCOUNTER — Ambulatory Visit
Admission: RE | Admit: 2021-07-24 | Discharge: 2021-07-24 | Disposition: A | Discharge status: Home / Self Care | Payer: 59 | Source: Ambulatory Visit | Attending: Radiation Oncology | Admitting: Radiation Oncology

## 2021-07-24 ENCOUNTER — Other Ambulatory Visit: Payer: Self-pay

## 2021-07-24 ENCOUNTER — Ambulatory Visit: Payer: 59

## 2021-07-24 DIAGNOSIS — Z171 Estrogen receptor negative status [ER-]: Secondary | ICD-10-CM

## 2021-07-24 DIAGNOSIS — R293 Abnormal posture: Secondary | ICD-10-CM

## 2021-07-24 DIAGNOSIS — M25612 Stiffness of left shoulder, not elsewhere classified: Secondary | ICD-10-CM

## 2021-07-24 DIAGNOSIS — Z5112 Encounter for antineoplastic immunotherapy: Secondary | ICD-10-CM | POA: Diagnosis not present

## 2021-07-24 DIAGNOSIS — M25512 Pain in left shoulder: Secondary | ICD-10-CM

## 2021-07-24 DIAGNOSIS — Z483 Aftercare following surgery for neoplasm: Secondary | ICD-10-CM

## 2021-07-24 NOTE — Patient Instructions (Signed)

## 2021-07-24 NOTE — Therapy (Signed)
[facility] @ [place], [name], [address] Phone: [phone]   Fax:  [phone]  Physical Therapy Treatment  Patient Details  Name: Maria Kennedy MRN: [id number] Date of Birth: Feb 13, 1971 Referring Provider (PT): Dr Iran Planas   Encounter Date: 07/24/2021   PT End of Session - 07/24/21 1031     Visit Number 3    Number of Visits 9    Date for PT Re-Evaluation 09/03/21    PT Start Time 1005    PT Stop Time 1106    PT Time Calculation (min) 61 min    Activity Tolerance Patient tolerated treatment well    Behavior During Therapy [facility] for tasks assessed/performed             Past Medical History:  Diagnosis Date   ADHD (attention deficit hyperactivity disorder)    Asthma    Cancer ([place])    breast ca   Depression    Family history of breast cancer    Family history of leukemia    Family history of lung cancer    Family history of pancreatic cancer     Past Surgical History:  Procedure Laterality Date   BREAST RECONSTRUCTION WITH PLACEMENT OF TISSUE EXPANDER AND ALLODERM Left 06/04/2021   Procedure: BREAST RECONSTRUCTION WITH PLACEMENT OF TISSUE EXPANDER AND ALLODERM;  Surgeon: Irene Limbo, MD;  Location: [facility];  Service: Plastics;  Laterality: Left;   MASTECTOMY WITH RADIOACTIVE SEED GUIDED EXCISION AND AXILLARY SENTINEL LYMPH NODE BIOPSY Left 06/04/2021   Procedure: LEFT MASTECTOMY, LEFT AXILLARY SENTINEL LYMPH NODE BIOPSY, LEFT AXILLARY NODE SEED GUIDED EXCISION;  Surgeon: Rolm Bookbinder, MD;  Location: [facility];  Service: General;  Laterality: Left;   PORTACATH PLACEMENT Right 01/07/2021   Procedure: INSERTION PORT-A-CATH;  Surgeon: Rolm Bookbinder, MD;  Location: [facility];  Service: General;  Laterality: Right;   TUBAL LIGATION      There were no vitals filed for this visit.   Subjective Assessment - 07/24/21 1013      Subjective I'm doing really well. Been doing the HEP stretches and my cording is much improved since I started coming here. I think I'm ready to be placed on hold until after radiation.    Pertinent History History of left mastectomy with expander and 0/1 lymph nodes positive on 06/04/21.  Completed neoadjuvant chemotherapy and will have radiation    Patient Stated Goals get my motion back    Currently in Pain? No/denies                               [facility] Adult PT Treatment/Exercise - 07/24/21 0001       Shoulder Exercises: Supine   Horizontal ABduction Strengthening;Both;10 reps;Theraband    Theraband Level (Shoulder Horizontal ABduction) Level 2 (Red)    Horizontal ABduction Limitations Pt returned therapist demo of each supine scapular series    External Rotation Strengthening;Both;10 reps;Theraband    Theraband Level (Shoulder External Rotation) Level 2 (Red)    Flexion Strengthening;Both;10 reps    Theraband Level (Shoulder Flexion) Level 2 (Red)    Diagonals Strengthening;Right;Left;10 reps;Theraband    Theraband Level (Shoulder Diagonals) Level 2 (Red)      Shoulder Exercises: Stretch   Corner Stretch 3 reps;10 seconds   in doorway in varying positions   Wall Stretch - Flexion 2 reps   in doorway     Manual  Therapy   Manual Therapy Soft tissue mobilization;Scapular mobilization    Soft tissue mobilization with cocoa butter to left shoulder in supine; then into Rt S/L for STM to Lt periscapular area and upper trap    Scapular Mobilization In Rt S/L to Lt scapular into protraction and retraction and depression                     PT Education - 07/24/21 1031     Education Details Supine scapular series with red theraband and end ROM and pectoralis stretching in doorway    Person(s) Educated Patient    Methods Explanation;Demonstration;Handout    Comprehension Verbalized understanding;Returned demonstration                 PT Long  Term Goals - 07/09/21 0953       PT LONG TERM GOAL #1   Title Patient will demonstrate she has regained full shoulder ROM and function post operatively compared to baselines.    Time 8    Period Weeks    Status On-going      PT LONG TERM GOAL #2   Title Pt will be ind with final HEP for use during and after radiation    Time 8    Period Weeks    Status New      PT LONG TERM GOAL #3   Title Pt will be educated on risk reduction, lymphedema risk, and use of SOZO    Baseline paritally met - still needs risk reduction sheet    Time 8    Period Weeks    Status New                   Plan - 07/24/21 1108     Clinical Impression Statement Pt continues to do very well with her progress. No cording noted today but pt does present with Lt posterior upper quadrant tightness that was greatly relieved after STM today. Also progressed HEP to include supine scapular series with red theraband but instructing pt to only perform these about 3x/wk while undergoing radiation so as to focus on strtching during this time more. Instructed pt in end ROM stretching in doorway along with pectoralis stretch as well. Pt returned good demonstration of all and reports feeling very good stretching with each. Plan to keep next weeks appt to reassess scapular tightness as she is currently undergoing radiation.    Personal Factors and Comorbidities Comorbidity 2    Comorbidities SLNB, chemotherapy hx    Examination-Activity Limitations Reach Overhead    Examination-Participation Restrictions Community Activity    Stability/Clinical Decision Making Stable/Uncomplicated    Rehab Potential Excellent    PT Frequency 1x / week    PT Duration 8 weeks    PT Treatment/Interventions ADLs/Self Care Home Management;Patient/family education;Manual techniques;Therapeutic exercise;Visual/perceptual remediation/compensation;Canalith Repostioning    PT Next Visit Plan continue ROM and soft tissue work add strengthening.   consider putting PT on hold til after radiation completed ????    PT Home Exercise Plan Access Code: [email or web address]; supine scapular series and doorway stretching    Consulted and Agree with Plan of Care Patient             Patient will benefit from skilled therapeutic intervention in order to improve the following deficits and impairments:  Decreased skin integrity, Impaired UE functional use, Dizziness  Visit Diagnosis: Aftercare following surgery for neoplasm  Stiffness of left shoulder, not elsewhere classified  Malignant neoplasm of upper-outer quadrant  of left breast in female, estrogen receptor negative ([place])  Abnormal posture  Acute pain of left shoulder     Problem List Patient Active Problem List   Diagnosis Date Noted   S/P mastectomy, left 06/04/2021   Genetic testing 01/18/2021   Port-A-Cath in place 01/15/2021   Family history of breast cancer    Family history of lung cancer    Family history of pancreatic cancer    Family history of leukemia    Malignant neoplasm of upper-outer quadrant of left breast in female, estrogen receptor negative ([place]) 12/24/2020   Benign paroxysmal positional vertigo of left ear 11/15/2018   ADD (attention deficit disorder) 08/11/2011    Otelia Limes, PTA 07/24/2021, 12:49 PM  [facility] @ [place], [name], [address] Phone: [phone]   Fax:  [phone]  Name: July Linam MRN: [id number] Date of Birth: Nov 08, 1970

## 2021-07-25 ENCOUNTER — Ambulatory Visit
Admission: RE | Admit: 2021-07-25 | Discharge: 2021-07-25 | Disposition: A | Discharge status: Home / Self Care | Payer: 59 | Source: Ambulatory Visit | Attending: Radiation Oncology | Admitting: Radiation Oncology

## 2021-07-25 DIAGNOSIS — Z5112 Encounter for antineoplastic immunotherapy: Secondary | ICD-10-CM | POA: Diagnosis not present

## 2021-07-26 ENCOUNTER — Ambulatory Visit
Admission: RE | Admit: 2021-07-26 | Discharge: 2021-07-26 | Disposition: A | Discharge status: Home / Self Care | Payer: 59 | Source: Ambulatory Visit | Attending: Radiation Oncology | Admitting: Radiation Oncology

## 2021-07-26 ENCOUNTER — Other Ambulatory Visit: Payer: Self-pay

## 2021-07-26 DIAGNOSIS — Z5112 Encounter for antineoplastic immunotherapy: Secondary | ICD-10-CM | POA: Diagnosis not present

## 2021-07-29 ENCOUNTER — Ambulatory Visit
Admission: RE | Admit: 2021-07-29 | Discharge: 2021-07-29 | Disposition: A | Discharge status: Home / Self Care | Payer: 59 | Source: Ambulatory Visit | Attending: Radiation Oncology | Admitting: Radiation Oncology

## 2021-07-29 ENCOUNTER — Other Ambulatory Visit: Payer: Self-pay

## 2021-07-29 DIAGNOSIS — Z5112 Encounter for antineoplastic immunotherapy: Secondary | ICD-10-CM | POA: Diagnosis not present

## 2021-07-30 ENCOUNTER — Encounter: Payer: Self-pay | Admitting: Rehabilitation

## 2021-07-30 ENCOUNTER — Ambulatory Visit: Payer: 59 | Admitting: Rehabilitation

## 2021-07-30 ENCOUNTER — Ambulatory Visit
Admission: RE | Admit: 2021-07-30 | Discharge: 2021-07-30 | Disposition: A | Discharge status: Home / Self Care | Payer: 59 | Source: Ambulatory Visit | Attending: Radiation Oncology | Admitting: Radiation Oncology

## 2021-07-30 DIAGNOSIS — C50412 Malignant neoplasm of upper-outer quadrant of left female breast: Secondary | ICD-10-CM

## 2021-07-30 DIAGNOSIS — Z483 Aftercare following surgery for neoplasm: Secondary | ICD-10-CM

## 2021-07-30 DIAGNOSIS — Z5112 Encounter for antineoplastic immunotherapy: Secondary | ICD-10-CM | POA: Diagnosis not present

## 2021-07-30 DIAGNOSIS — R293 Abnormal posture: Secondary | ICD-10-CM

## 2021-07-30 DIAGNOSIS — M25612 Stiffness of left shoulder, not elsewhere classified: Secondary | ICD-10-CM

## 2021-07-30 NOTE — Therapy (Signed)
[facility] @ [place], [name], [address] Phone: [phone]   Fax:  [phone]  Physical Therapy Treatment  Patient Details  Name: Maria Kennedy MRN: [id number] Date of Birth: 1971-02-11 Referring Provider (PT): Dr Iran Planas   Encounter Date: 07/30/2021   PT End of Session - 07/30/21 1647     Visit Number 4    Number of Visits 9    Date for PT Re-Evaluation 09/03/21    PT Start Time 1600    PT Stop Time 1640    PT Time Calculation (min) 40 min    Activity Tolerance Patient tolerated treatment well    Behavior During Therapy [facility] for tasks assessed/performed             Past Medical History:  Diagnosis Date   ADHD (attention deficit hyperactivity disorder)    Asthma    Cancer ([place])    breast ca   Depression    Family history of breast cancer    Family history of leukemia    Family history of lung cancer    Family history of pancreatic cancer     Past Surgical History:  Procedure Laterality Date   BREAST RECONSTRUCTION WITH PLACEMENT OF TISSUE EXPANDER AND ALLODERM Left 06/04/2021   Procedure: BREAST RECONSTRUCTION WITH PLACEMENT OF TISSUE EXPANDER AND ALLODERM;  Surgeon: Irene Limbo, MD;  Location: [facility];  Service: Plastics;  Laterality: Left;   MASTECTOMY WITH RADIOACTIVE SEED GUIDED EXCISION AND AXILLARY SENTINEL LYMPH NODE BIOPSY Left 06/04/2021   Procedure: LEFT MASTECTOMY, LEFT AXILLARY SENTINEL LYMPH NODE BIOPSY, LEFT AXILLARY NODE SEED GUIDED EXCISION;  Surgeon: Rolm Bookbinder, MD;  Location: [facility];  Service: General;  Laterality: Left;   PORTACATH PLACEMENT Right 01/07/2021   Procedure: INSERTION PORT-A-CATH;  Surgeon: Rolm Bookbinder, MD;  Location: [facility];  Service: General;  Laterality: Right;   TUBAL LIGATION      There were no vitals filed for this visit.   Subjective Assessment - 07/30/21 1602      Subjective My skin is doing well.  I just have that tight shoulder    Pertinent History History of left mastectomy with expander and 0/1 lymph nodes positive on 06/04/21.  Completed neoadjuvant chemotherapy and will have radiation    Currently in Pain? No/denies                [facility] PT Assessment - 07/30/21 0001       AROM   Left Shoulder Flexion 167 Degrees    Left Shoulder ABduction 165 Degrees      PROM   Overall PROM Comments no cording                           [facility] Adult PT Treatment/Exercise - 07/30/21 0001       Exercises   Exercises Shoulder;Other Exercises    Other Exercises  seated UT and LS stretch, XYW AROM x 5      Manual Therapy   Manual therapy comments assessed goals and AROM    Soft tissue mobilization with cocoa butter to left shoulder in supine; then into Rt S/L for STM to Lt periscapular area and upper trap                          PT Long Term Goals - 07/30/21 1607  PT LONG TERM GOAL #1   Title Patient will demonstrate she has regained full shoulder ROM and function post operatively compared to baselines.    Status Achieved      PT LONG TERM GOAL #2   Title Pt will be ind with final HEP for use during and after radiation    Status Achieved      PT LONG TERM GOAL #3   Title Pt will be educated on risk reduction, lymphedema risk, and use of SOZO    Status Achieved                   Plan - 07/30/21 1649     Clinical Impression Statement Left UT tightness and tension remain improved with MT but overall all goals have been met and pt will be finishing up radiation.  Pt knows she can return at any time if needed and will be back for SOZO in around 1 month.    PT Frequency 1x / week    PT Duration 8 weeks    PT Treatment/Interventions ADLs/Self Care Home Management;Patient/family education;Manual techniques;Therapeutic exercise;Visual/perceptual remediation/compensation;Canalith Repostioning    PT  Next Visit Plan SOZO, see how pt is doing    Consulted and Agree with Plan of Care Patient             Patient will benefit from skilled therapeutic intervention in order to improve the following deficits and impairments:     Visit Diagnosis: Aftercare following surgery for neoplasm  Stiffness of left shoulder, not elsewhere classified  Malignant neoplasm of upper-outer quadrant of left breast in female, estrogen receptor negative ([place])  Abnormal posture     Problem List Patient Active Problem List   Diagnosis Date Noted   S/P mastectomy, left 06/04/2021   Genetic testing 01/18/2021   Port-A-Cath in place 01/15/2021   Family history of breast cancer    Family history of lung cancer    Family history of pancreatic cancer    Family history of leukemia    Malignant neoplasm of upper-outer quadrant of left breast in female, estrogen receptor negative ([place]) 12/24/2020   Benign paroxysmal positional vertigo of left ear 11/15/2018   ADD (attention deficit disorder) 08/11/2011    Stark Bray, PT 07/30/2021, 4:50 PM  [facility] @ [place], [name], [address] Phone: [phone]   Fax:  [phone]  Name: Rayshell Goecke MRN: [id number] Date of Birth: 1971-09-30

## 2021-07-31 ENCOUNTER — Ambulatory Visit
Admission: RE | Admit: 2021-07-31 | Discharge: 2021-07-31 | Disposition: A | Discharge status: Home / Self Care | Payer: 59 | Source: Ambulatory Visit | Attending: Radiation Oncology | Admitting: Radiation Oncology

## 2021-07-31 ENCOUNTER — Other Ambulatory Visit: Payer: Self-pay

## 2021-07-31 DIAGNOSIS — Z5112 Encounter for antineoplastic immunotherapy: Secondary | ICD-10-CM | POA: Diagnosis not present

## 2021-08-01 ENCOUNTER — Ambulatory Visit
Admission: RE | Admit: 2021-08-01 | Discharge: 2021-08-01 | Disposition: A | Discharge status: Home / Self Care | Payer: 59 | Source: Ambulatory Visit | Attending: Radiation Oncology | Admitting: Radiation Oncology

## 2021-08-01 ENCOUNTER — Other Ambulatory Visit: Payer: Self-pay | Admitting: *Deleted

## 2021-08-01 DIAGNOSIS — C50412 Malignant neoplasm of upper-outer quadrant of left female breast: Secondary | ICD-10-CM

## 2021-08-01 DIAGNOSIS — Z5112 Encounter for antineoplastic immunotherapy: Secondary | ICD-10-CM | POA: Diagnosis not present

## 2021-08-01 MED ORDER — RADIAPLEXRX EX GEL: Freq: Once | CUTANEOUS | Status: AC

## 2021-08-02 ENCOUNTER — Ambulatory Visit: Payer: PRIVATE HEALTH INSURANCE | Admitting: Hematology and Oncology

## 2021-08-02 ENCOUNTER — Other Ambulatory Visit: Payer: Self-pay | Admitting: *Deleted

## 2021-08-02 ENCOUNTER — Inpatient Hospital Stay: Payer: 59 | Attending: Hematology and Oncology

## 2021-08-02 ENCOUNTER — Ambulatory Visit
Admission: RE | Admit: 2021-08-02 | Discharge: 2021-08-02 | Disposition: A | Discharge status: Home / Self Care | Payer: 59 | Source: Ambulatory Visit | Attending: Radiation Oncology | Admitting: Radiation Oncology

## 2021-08-02 ENCOUNTER — Other Ambulatory Visit: Payer: Self-pay

## 2021-08-02 ENCOUNTER — Inpatient Hospital Stay: Payer: 59

## 2021-08-02 VITALS — BP 130/95 | HR 69 | Temp 98.4°F | Resp 18 | Wt 185.8 lb

## 2021-08-02 DIAGNOSIS — Z79899 Other long term (current) drug therapy: Secondary | ICD-10-CM | POA: Insufficient documentation

## 2021-08-02 DIAGNOSIS — C773 Secondary and unspecified malignant neoplasm of axilla and upper limb lymph nodes: Secondary | ICD-10-CM | POA: Insufficient documentation

## 2021-08-02 DIAGNOSIS — Z23 Encounter for immunization: Secondary | ICD-10-CM | POA: Insufficient documentation

## 2021-08-02 DIAGNOSIS — R293 Abnormal posture: Secondary | ICD-10-CM | POA: Insufficient documentation

## 2021-08-02 DIAGNOSIS — M25612 Stiffness of left shoulder, not elsewhere classified: Secondary | ICD-10-CM | POA: Insufficient documentation

## 2021-08-02 DIAGNOSIS — Z171 Estrogen receptor negative status [ER-]: Secondary | ICD-10-CM | POA: Insufficient documentation

## 2021-08-02 DIAGNOSIS — R42 Dizziness and giddiness: Secondary | ICD-10-CM | POA: Insufficient documentation

## 2021-08-02 DIAGNOSIS — C50412 Malignant neoplasm of upper-outer quadrant of left female breast: Secondary | ICD-10-CM

## 2021-08-02 DIAGNOSIS — Z5112 Encounter for antineoplastic immunotherapy: Secondary | ICD-10-CM | POA: Insufficient documentation

## 2021-08-02 DIAGNOSIS — Z95828 Presence of other vascular implants and grafts: Secondary | ICD-10-CM

## 2021-08-02 DIAGNOSIS — M25512 Pain in left shoulder: Secondary | ICD-10-CM | POA: Insufficient documentation

## 2021-08-02 LAB — CBC WITH DIFFERENTIAL (CANCER CENTER ONLY)
Abs Immature Granulocytes: 0.01 10*3/uL (ref 0.00–0.07)
Basophils Absolute: 0 10*3/uL (ref 0.0–0.1)
Basophils Relative: 1 %
Eosinophils Absolute: 0.1 10*3/uL (ref 0.0–0.5)
Eosinophils Relative: 3 %
HCT: 33.3 % — ABNORMAL LOW (ref 36.0–46.0)
Hemoglobin: 10.5 g/dL — ABNORMAL LOW (ref 12.0–15.0)
Immature Granulocytes: 0 %
Lymphocytes Relative: 25 %
Lymphs Abs: 1.1 10*3/uL (ref 0.7–4.0)
MCH: 27.3 pg (ref 26.0–34.0)
MCHC: 31.5 g/dL (ref 30.0–36.0)
MCV: 86.7 fL (ref 80.0–100.0)
Monocytes Absolute: 0.3 10*3/uL (ref 0.1–1.0)
Monocytes Relative: 7 %
Neutro Abs: 2.8 10*3/uL (ref 1.7–7.7)
Neutrophils Relative %: 64 %
Platelet Count: 311 10*3/uL (ref 150–400)
RBC: 3.84 MIL/uL — ABNORMAL LOW (ref 3.87–5.11)
RDW: 14.5 % (ref 11.5–15.5)
WBC Count: 4.3 10*3/uL (ref 4.0–10.5)
nRBC: 0 % (ref 0.0–0.2)

## 2021-08-02 LAB — CMP (CANCER CENTER ONLY)
ALT: 18 U/L (ref 0–44)
AST: 18 U/L (ref 15–41)
Albumin: 4.2 g/dL (ref 3.5–5.0)
Alkaline Phosphatase: 49 U/L (ref 38–126)
Anion gap: 7 (ref 5–15)
BUN: 14 mg/dL (ref 6–20)
CO2: 26 mmol/L (ref 22–32)
Calcium: 9.5 mg/dL (ref 8.9–10.3)
Chloride: 108 mmol/L (ref 98–111)
Creatinine: 0.65 mg/dL (ref 0.44–1.00)
GFR, Estimated: >60 mL/min (ref >60)
Glucose, Bld: 109 mg/dL — ABNORMAL HIGH (ref 70–99)
Potassium: 4 mmol/L (ref 3.5–5.1)
Sodium: 141 mmol/L (ref 135–145)
Total Bilirubin: 0.5 mg/dL (ref 0.3–1.2)
Total Protein: 7.3 g/dL (ref 6.5–8.1)

## 2021-08-02 MED ORDER — INFLUENZA VAC SPLIT QUAD 0.5 ML IM SUSY
0.5000 mL | PREFILLED_SYRINGE | Freq: Once | INTRAMUSCULAR | Status: AC
  Administered 2021-08-02: 0.5 mL via INTRAMUSCULAR
  Filled 2021-08-02: qty 0.5

## 2021-08-02 MED ORDER — SODIUM CHLORIDE 0.9% FLUSH
10.0000 mL | INTRAVENOUS | Status: DC | PRN
  Administered 2021-08-02: 10 mL

## 2021-08-02 MED ORDER — HEPARIN SOD (PORK) LOCK FLUSH 100 UNIT/ML IV SOLN
500.0000 [IU] | Freq: Once | INTRAVENOUS | Status: AC | PRN
  Administered 2021-08-02: 500 [IU]

## 2021-08-02 MED ORDER — DIPHENHYDRAMINE HCL 25 MG PO CAPS
25.0000 mg | ORAL_CAPSULE | Freq: Once | ORAL | Status: AC
  Administered 2021-08-02: 25 mg via ORAL
  Filled 2021-08-02: qty 1

## 2021-08-02 MED ORDER — ACETAMINOPHEN 325 MG PO TABS
650.0000 mg | ORAL_TABLET | Freq: Once | ORAL | Status: AC
  Administered 2021-08-02: 650 mg via ORAL
  Filled 2021-08-02: qty 2

## 2021-08-02 MED ORDER — SODIUM CHLORIDE 0.9% FLUSH
10.0000 mL | Freq: Once | INTRAVENOUS | Status: AC
  Administered 2021-08-02: 10 mL

## 2021-08-02 MED ORDER — SODIUM CHLORIDE 0.9 % IV SOLN
420.0000 mg | Freq: Once | INTRAVENOUS | Status: AC
  Administered 2021-08-02: 420 mg via INTRAVENOUS
  Filled 2021-08-02: qty 14

## 2021-08-02 MED ORDER — SODIUM CHLORIDE 0.9 % IV SOLN: Freq: Once | INTRAVENOUS | Status: AC

## 2021-08-02 MED ORDER — TRASTUZUMAB-DKST CHEMO 150 MG IV SOLR
6.0000 mg/kg | Freq: Once | INTRAVENOUS | Status: AC
  Administered 2021-08-02: 462 mg via INTRAVENOUS
  Filled 2021-08-02: qty 22

## 2021-08-02 NOTE — Patient Instructions (Signed)
[facility]  Discharge Instructions: Thank you for choosing [facility] to provide your oncology and hematology care.   If you have a lab appointment with the [facility], please go directly to the [facility] and check in at the registration area.   Wear comfortable clothing and clothing appropriate for easy access to any Portacath or PICC line.   We strive to give you quality time with your provider. You may need to reschedule your appointment if you arrive late (15 or more minutes).  Arriving late affects you and other patients whose appointments are after yours.  Also, if you miss three or more appointments without notifying the office, you may be dismissed from the clinic at the provider's discretion.      For prescription refill requests, have your pharmacy contact our office and allow 72 hours for refills to be completed.    Today you received the following chemotherapy and/or immunotherapy agents Herceptin and perjeta      To help prevent nausea and vomiting after your treatment, we encourage you to take your nausea medication as directed.  BELOW ARE SYMPTOMS THAT SHOULD BE REPORTED IMMEDIATELY: *FEVER GREATER THAN 100.4 F (38 C) OR HIGHER *CHILLS OR SWEATING *NAUSEA AND VOMITING THAT IS NOT CONTROLLED WITH YOUR NAUSEA MEDICATION *UNUSUAL SHORTNESS OF BREATH *UNUSUAL BRUISING OR BLEEDING *URINARY PROBLEMS (pain or burning when urinating, or frequent urination) *BOWEL PROBLEMS (unusual diarrhea, constipation, pain near the anus) TENDERNESS IN MOUTH AND THROAT WITH OR WITHOUT PRESENCE OF ULCERS (sore throat, sores in mouth, or a toothache) UNUSUAL RASH, SWELLING OR PAIN  UNUSUAL VAGINAL DISCHARGE OR ITCHING   Items with * indicate a potential emergency and should be followed up as soon as possible or go to the Emergency Department if any problems should occur.  Please show the CHEMOTHERAPY ALERT CARD or IMMUNOTHERAPY ALERT CARD at  check-in to the Emergency Department and triage nurse.  Should you have questions after your visit or need to cancel or reschedule your appointment, please contact [facility]  Dept: [phone]  and follow the prompts.  Office hours are 8:00 a.m. to 4:30 p.m. Monday - Friday. Please note that voicemails left after 4:00 p.m. may not be returned until the following business day.  We are closed weekends and major holidays. You have access to a nurse at all times for urgent questions. Please call the main number to the clinic Dept: [phone] and follow the prompts.   For any non-urgent questions, you may also contact your provider using MyChart. We now offer e-Visits for anyone 60 and older to request care online for non-urgent symptoms. For details visit [email or web address].   Also download the MyChart app! Go to the app store, search "MyChart", open the app, select [facility], and log in with your MyChart username and password.  Due to Covid, a mask is required upon entering the hospital/clinic. If you do not have a mask, one will be given to you upon arrival. For doctor visits, patients may have 1 support person aged 34 or older with them. For treatment visits, patients cannot have anyone with them due to current Covid guidelines and our immunocompromised population.

## 2021-08-02 NOTE — Progress Notes (Signed)
MD aware last echo 04/17/21; ok to proceed with treatment.

## 2021-08-05 ENCOUNTER — Other Ambulatory Visit: Payer: Self-pay

## 2021-08-05 ENCOUNTER — Ambulatory Visit
Admission: RE | Admit: 2021-08-05 | Discharge: 2021-08-05 | Disposition: A | Discharge status: Home / Self Care | Payer: 59 | Source: Ambulatory Visit | Attending: Radiation Oncology | Admitting: Radiation Oncology

## 2021-08-05 DIAGNOSIS — Z5112 Encounter for antineoplastic immunotherapy: Secondary | ICD-10-CM | POA: Diagnosis not present

## 2021-08-06 ENCOUNTER — Ambulatory Visit
Admission: RE | Admit: 2021-08-06 | Discharge: 2021-08-06 | Disposition: A | Discharge status: Home / Self Care | Payer: 59 | Source: Ambulatory Visit | Attending: Radiation Oncology | Admitting: Radiation Oncology

## 2021-08-06 DIAGNOSIS — Z5112 Encounter for antineoplastic immunotherapy: Secondary | ICD-10-CM | POA: Diagnosis not present

## 2021-08-07 ENCOUNTER — Ambulatory Visit
Admission: RE | Admit: 2021-08-07 | Discharge: 2021-08-07 | Disposition: A | Discharge status: Home / Self Care | Payer: 59 | Source: Ambulatory Visit | Attending: Radiation Oncology | Admitting: Radiation Oncology

## 2021-08-07 ENCOUNTER — Other Ambulatory Visit: Payer: Self-pay

## 2021-08-07 DIAGNOSIS — Z5112 Encounter for antineoplastic immunotherapy: Secondary | ICD-10-CM | POA: Diagnosis not present

## 2021-08-08 ENCOUNTER — Ambulatory Visit
Admission: RE | Admit: 2021-08-08 | Discharge: 2021-08-08 | Disposition: A | Discharge status: Home / Self Care | Payer: 59 | Source: Ambulatory Visit | Attending: Radiation Oncology | Admitting: Radiation Oncology

## 2021-08-08 DIAGNOSIS — Z5112 Encounter for antineoplastic immunotherapy: Secondary | ICD-10-CM | POA: Diagnosis not present

## 2021-08-09 ENCOUNTER — Ambulatory Visit
Admission: RE | Admit: 2021-08-09 | Discharge: 2021-08-09 | Disposition: A | Discharge status: Home / Self Care | Payer: 59 | Source: Ambulatory Visit | Attending: Radiation Oncology | Admitting: Radiation Oncology

## 2021-08-09 ENCOUNTER — Ambulatory Visit (HOSPITAL_COMMUNITY)
Admission: RE | Admit: 2021-08-09 | Discharge: 2021-08-09 | Disposition: A | Discharge status: Home / Self Care | Payer: 59 | Source: Ambulatory Visit | Attending: Hematology and Oncology | Admitting: Hematology and Oncology

## 2021-08-09 ENCOUNTER — Other Ambulatory Visit: Payer: Self-pay

## 2021-08-09 DIAGNOSIS — Z5181 Encounter for therapeutic drug level monitoring: Secondary | ICD-10-CM | POA: Insufficient documentation

## 2021-08-09 DIAGNOSIS — C50919 Malignant neoplasm of unspecified site of unspecified female breast: Secondary | ICD-10-CM | POA: Diagnosis present

## 2021-08-09 DIAGNOSIS — Z79899 Other long term (current) drug therapy: Secondary | ICD-10-CM

## 2021-08-09 DIAGNOSIS — Z0189 Encounter for other specified special examinations: Secondary | ICD-10-CM | POA: Diagnosis not present

## 2021-08-09 DIAGNOSIS — I517 Cardiomegaly: Secondary | ICD-10-CM | POA: Diagnosis not present

## 2021-08-09 LAB — ECHOCARDIOGRAM COMPLETE
Area-P 1/2: 3.6 cm2
Calc EF: 60.9 %
S' Lateral: 2.4 cm
Single Plane A2C EF: 66.9 %
Single Plane A4C EF: 60.4 %

## 2021-08-09 NOTE — Progress Notes (Signed)
  Echocardiogram 2D Echocardiogram has been performed.  Maria Kennedy 08/09/2021, 10:07 AM

## 2021-08-12 ENCOUNTER — Other Ambulatory Visit: Payer: Self-pay

## 2021-08-12 ENCOUNTER — Ambulatory Visit
Admission: RE | Admit: 2021-08-12 | Discharge: 2021-08-12 | Disposition: A | Discharge status: Home / Self Care | Payer: 59 | Source: Ambulatory Visit | Attending: Radiation Oncology | Admitting: Radiation Oncology

## 2021-08-12 DIAGNOSIS — Z5112 Encounter for antineoplastic immunotherapy: Secondary | ICD-10-CM | POA: Diagnosis not present

## 2021-08-13 ENCOUNTER — Ambulatory Visit: Payer: 59

## 2021-08-13 ENCOUNTER — Ambulatory Visit
Admission: RE | Admit: 2021-08-13 | Discharge: 2021-08-13 | Disposition: A | Discharge status: Home / Self Care | Payer: 59 | Source: Ambulatory Visit | Attending: Radiation Oncology | Admitting: Radiation Oncology

## 2021-08-13 DIAGNOSIS — Z171 Estrogen receptor negative status [ER-]: Secondary | ICD-10-CM | POA: Insufficient documentation

## 2021-08-13 DIAGNOSIS — C50412 Malignant neoplasm of upper-outer quadrant of left female breast: Secondary | ICD-10-CM | POA: Insufficient documentation

## 2021-08-13 DIAGNOSIS — Z803 Family history of malignant neoplasm of breast: Secondary | ICD-10-CM | POA: Insufficient documentation

## 2021-08-14 ENCOUNTER — Other Ambulatory Visit: Payer: Self-pay

## 2021-08-14 ENCOUNTER — Ambulatory Visit
Admission: RE | Admit: 2021-08-14 | Discharge: 2021-08-14 | Disposition: A | Discharge status: Home / Self Care | Payer: 59 | Source: Ambulatory Visit | Attending: Radiation Oncology | Admitting: Radiation Oncology

## 2021-08-14 DIAGNOSIS — C50412 Malignant neoplasm of upper-outer quadrant of left female breast: Secondary | ICD-10-CM | POA: Diagnosis not present

## 2021-08-15 ENCOUNTER — Ambulatory Visit
Admission: RE | Admit: 2021-08-15 | Discharge: 2021-08-15 | Disposition: A | Discharge status: Home / Self Care | Payer: 59 | Source: Ambulatory Visit | Attending: Radiation Oncology | Admitting: Radiation Oncology

## 2021-08-15 DIAGNOSIS — C50412 Malignant neoplasm of upper-outer quadrant of left female breast: Secondary | ICD-10-CM | POA: Diagnosis not present

## 2021-08-16 ENCOUNTER — Other Ambulatory Visit: Payer: Self-pay

## 2021-08-16 ENCOUNTER — Ambulatory Visit
Admission: RE | Admit: 2021-08-16 | Discharge: 2021-08-16 | Disposition: A | Discharge status: Home / Self Care | Payer: 59 | Source: Ambulatory Visit | Attending: Radiation Oncology | Admitting: Radiation Oncology

## 2021-08-16 DIAGNOSIS — C50412 Malignant neoplasm of upper-outer quadrant of left female breast: Secondary | ICD-10-CM | POA: Diagnosis not present

## 2021-08-19 ENCOUNTER — Other Ambulatory Visit: Payer: Self-pay

## 2021-08-19 ENCOUNTER — Ambulatory Visit
Admission: RE | Admit: 2021-08-19 | Discharge: 2021-08-19 | Disposition: A | Discharge status: Home / Self Care | Payer: 59 | Source: Ambulatory Visit | Attending: Radiation Oncology | Admitting: Radiation Oncology

## 2021-08-19 DIAGNOSIS — C50412 Malignant neoplasm of upper-outer quadrant of left female breast: Secondary | ICD-10-CM

## 2021-08-19 DIAGNOSIS — Z171 Estrogen receptor negative status [ER-]: Secondary | ICD-10-CM

## 2021-08-19 DIAGNOSIS — Z803 Family history of malignant neoplasm of breast: Secondary | ICD-10-CM

## 2021-08-19 MED ORDER — RADIAPLEXRX EX GEL: Freq: Once | CUTANEOUS | Status: AC

## 2021-08-20 ENCOUNTER — Ambulatory Visit
Admission: RE | Admit: 2021-08-20 | Discharge: 2021-08-20 | Disposition: A | Discharge status: Home / Self Care | Payer: 59 | Source: Ambulatory Visit | Attending: Radiation Oncology | Admitting: Radiation Oncology

## 2021-08-20 ENCOUNTER — Encounter: Payer: Self-pay | Admitting: *Deleted

## 2021-08-20 DIAGNOSIS — C50412 Malignant neoplasm of upper-outer quadrant of left female breast: Secondary | ICD-10-CM | POA: Diagnosis not present

## 2021-08-21 ENCOUNTER — Ambulatory Visit
Admission: RE | Admit: 2021-08-21 | Discharge: 2021-08-21 | Disposition: A | Discharge status: Home / Self Care | Payer: 59 | Source: Ambulatory Visit | Attending: Radiation Oncology | Admitting: Radiation Oncology

## 2021-08-21 ENCOUNTER — Ambulatory Visit: Payer: 59

## 2021-08-21 ENCOUNTER — Other Ambulatory Visit: Payer: Self-pay

## 2021-08-21 DIAGNOSIS — C50412 Malignant neoplasm of upper-outer quadrant of left female breast: Secondary | ICD-10-CM | POA: Diagnosis not present

## 2021-08-21 NOTE — Progress Notes (Signed)
Patient Care Team: London Pepper, MD as PCP - General (Family Medicine) Mauro Kaufmann, RN as Oncology Nurse Navigator Rockwell Germany, RN as Oncology Nurse Navigator Rolm Bookbinder, MD as Consulting Physician (General Surgery) Nicholas Lose, MD as Consulting Physician (Hematology and Oncology) Eppie Gibson, MD as Attending Physician (Radiation Oncology)  DIAGNOSIS:    ICD-10-CM   1. Malignant neoplasm of upper-outer quadrant of left breast in female, estrogen receptor negative ([facility])  C50.412    Z17.1       SUMMARY OF ONCOLOGIC HISTORY: Oncology History  Malignant neoplasm of upper-outer quadrant of left breast in female, estrogen receptor negative ([facility])  12/19/2020 Initial Diagnosis   Patient palpated a left breast mass and noted left axilla tenderness for one week. Diagnostic mammogram and US showed a conglomeration of masses in the left breast at the 2 o'clock position 3-4cm from the nipple spanning 3.9cm total, with a 0.7cm mass at the 2 o'clock position 6cm from the nipple, and two abnormal axillary lymph nodes. 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   12/26/2020 Cancer Staging   Staging form: Breast, AJCC 8th Edition - Clinical stage from 12/26/2020: Stage IIIA (cT3, cN1, cM0, G3, ER-, PR-, HER2+) - Signed by Nicholas Lose, MD on 12/26/2020 Stage prefix: Initial diagnosis Histologic grading system: 3 grade system    01/08/2021 - 05/14/2021 Chemotherapy   TCHP x6 cycles followed by Herceptin Perjeta maintenance        01/18/2021 Genetic Testing   Negative genetic testing:  No pathogenic variants detected on the [facility] CancerNext-Expanded + RNAinsight panel. The report date is 01/18/2021.   The CancerNext-Expanded + RNAinsight gene panel offered by [employer] and includes sequencing and rearrangement analysis for the following 77 genes: AIP, ALK, APC, ATM, AXIN2, BAP1, BARD1, BLM, BMPR1A, BRCA1, BRCA2,  BRIP1, CDC73, CDH1, CDK4, CDKN1B, CDKN2A, CHEK2, CTNNA1, DICER1, FANCC, FH, FLCN, GALNT12, KIF1B, LZTR1, MAX, MEN1, MET, MLH1, MSH2, MSH3, MSH6, MUTYH, NBN, NF1, NF2, NTHL1, PALB2, PHOX2B, PMS2, POT1, PRKAR1A, PTCH1, PTEN, RAD51C, RAD51D, RB1, RECQL, RET, SDHA, SDHAF2, SDHB, SDHC, SDHD, SMAD4, SMARCA4, SMARCB1, SMARCE1, STK11, SUFU, TMEM127, TP53, TSC1, TSC2, VHL and XRCC2 (sequencing and deletion/duplication); EGFR, EGLN1, HOXB13, KIT, MITF, PDGFRA, POLD1 and POLE (sequencing only); EPCAM and GREM1 (deletion/duplication only). RNA data is routinely analyzed for use in variant interpretation for all genes.   06/22/2021 -  Chemotherapy   Patient is on Treatment Plan : BREAST Trastuzumab + Pertuzumab q21d     07/16/2021 - 08/22/2021 Radiation Therapy   Adjuvant radiation     CHIEF COMPLIANT: Cycle 4 Herceptin and Perjeta  INTERVAL HISTORY: Maria Kennedy is a 50 y.o. with above-mentioned history of breast cancer currently on neoadjuvant chemotherapy with Herceptin and Perjeta. She presents to the clinic today for treatment.   ALLERGIES:  is allergic to latex.  MEDICATIONS:  Current Outpatient Medications  Medication Sig Dispense Refill   acetaminophen (TYLENOL) 650 MG CR tablet Take 650 mg by mouth every 8 (eight) hours as needed for pain.     albuterol (VENTOLIN HFA) 108 (90 Base) MCG/ACT inhaler Inhale 2 puffs into the lungs every 6 (six) hours as needed for wheezing or shortness of breath.     amphetamine-dextroamphetamine (ADDERALL XR) 20 MG 24 hr capsule Take 20 mg by mouth daily as needed.     LORazepam (ATIVAN) 0.5 MG tablet Take 1 tablet (0.5 mg total) by mouth at bedtime. 30 tablet 3   Menaquinone-7 (VITAMIN K2)  40 MCG TABS Take 2 tablets by mouth daily. 80 mcg daily total     VITAMIN D PO Take 5,000 Int'l Units by mouth every other day.     No current facility-administered medications for this visit.    PHYSICAL EXAMINATION: ECOG PERFORMANCE STATUS: 1 - Symptomatic but completely  ambulatory  Vitals:   08/23/21 0841  BP: (!) 121/95  Pulse: 89  Resp: 16  Temp: (!) 97.5 F (36.4 C)  SpO2: 98%   Filed Weights   08/23/21 0841  Weight: 185 lb (83.9 kg)    LABORATORY DATA:  I have reviewed the data as listed CMP Latest Ref Rng & Units 08/02/2021 05/14/2021 04/23/2021  Glucose 70 - 99 mg/dL 109(H) 93 197(H)  BUN 6 - 20 mg/dL _0 Creatinine 0.44 - 1.00 mg/dL 0.65 0.76 0.82  Sodium 135 - 145 mmol/L 141 140 140  Potassium 3.5 - 5.1 mmol/L 4.0 3.9 4.3  Chloride 98 - 111 mmol/L 108 106 106  CO2 22 - 32 mmol/L _1 Calcium 8.9 - 10.3 mg/dL 9.5 9.3 10.1  Total Protein 6.5 - 8.1 g/dL 7.3 6.5 7.0  Total Bilirubin 0.3 - 1.2 mg/dL 0.5 0.3 0.3  Alkaline Phos 38 - 126 U/L 49 53 56  AST 15 - 41 U/L _2 ALT 0 - 44 U/L _3 Lab Results  Component Value Date   WBC 4.3 08/02/2021   HGB 10.5 (L) 08/02/2021   HCT 33.3 (L) 08/02/2021   MCV 86.7 08/02/2021   PLT 311 08/02/2021   NEUTROABS 2.8 08/02/2021    ASSESSMENT & PLAN:  Malignant neoplasm of upper-outer quadrant of left breast in female, estrogen receptor negative ([facility]) 12/19/2020: Palpable left breast mass and left axillary tenderness.  Mammogram revealed conglomeration of masses 3.9 cm and an adjacent 0.7 cm mass and 2 axillary lymph nodes that were abnormal. Biopsy revealed IDC with DCIS, grade 3, ER/PR negative, HER-2 +3+ by [facility]   12/24/2020: Breast MRI revealed mass or non-mass enhancement measuring 9.5 cm and 3 abnormal lymph nodes 01-02-21: Bone scan: No bone mets 01/01/2021: CT CAP: No distant metastatic disease   Treatment Plan: 1. Neoadjuvant chemotherapy with TCH Perjeta 6 cycles followed by Herceptin Perjeta maintenance  2. left mastectomy 06/04/2021: Dr. Donne Hazel: Complete pathologic response, no residual cancer, 0/1 lymph node negative 3. Followed by adjuvant radiation therapy completed  08/22/2021 -------------------------------------------------------------------------------------------------------------------------------- Current treatment: Herceptin Perjeta maintenance Toxicities: None Return to clinic every 3 weeks for Herceptin and Perjeta and every 6 weeks with follow-up with me.    No orders of the defined types were placed in this encounter.  The patient has a good understanding of the overall plan. she agrees with it. she will call with any problems that may develop before the next visit here.  Total time spent: 20 mins including face to face time and time spent for planning, charting and coordination of care  Rulon Eisenmenger, MD, MPH 08/23/2021  I, Thana Ates, am acting as scribe for Dr. Nicholas Lose.  I have reviewed the above documentation for accuracy and completeness, and I agree with the above.

## 2021-08-22 ENCOUNTER — Ambulatory Visit: Payer: 59

## 2021-08-22 ENCOUNTER — Encounter: Payer: Self-pay | Admitting: Radiation Oncology

## 2021-08-22 ENCOUNTER — Ambulatory Visit
Admission: RE | Admit: 2021-08-22 | Discharge: 2021-08-22 | Disposition: A | Discharge status: Home / Self Care | Payer: 59 | Source: Ambulatory Visit | Attending: Radiation Oncology | Admitting: Radiation Oncology

## 2021-08-22 DIAGNOSIS — C50412 Malignant neoplasm of upper-outer quadrant of left female breast: Secondary | ICD-10-CM | POA: Diagnosis not present

## 2021-08-23 ENCOUNTER — Inpatient Hospital Stay (HOSPITAL_BASED_OUTPATIENT_CLINIC_OR_DEPARTMENT_OTHER): Payer: 59 | Admitting: Hematology and Oncology

## 2021-08-23 ENCOUNTER — Inpatient Hospital Stay: Payer: 59 | Attending: Hematology and Oncology

## 2021-08-23 ENCOUNTER — Other Ambulatory Visit: Payer: Self-pay

## 2021-08-23 DIAGNOSIS — C50412 Malignant neoplasm of upper-outer quadrant of left female breast: Secondary | ICD-10-CM

## 2021-08-23 DIAGNOSIS — Z79899 Other long term (current) drug therapy: Secondary | ICD-10-CM | POA: Diagnosis not present

## 2021-08-23 DIAGNOSIS — Z171 Estrogen receptor negative status [ER-]: Secondary | ICD-10-CM | POA: Diagnosis not present

## 2021-08-23 DIAGNOSIS — C773 Secondary and unspecified malignant neoplasm of axilla and upper limb lymph nodes: Secondary | ICD-10-CM | POA: Diagnosis present

## 2021-08-23 DIAGNOSIS — Z5112 Encounter for antineoplastic immunotherapy: Secondary | ICD-10-CM | POA: Diagnosis not present

## 2021-08-23 MED ORDER — HEPARIN SOD (PORK) LOCK FLUSH 100 UNIT/ML IV SOLN
500.0000 [IU] | Freq: Once | INTRAVENOUS | Status: AC | PRN
  Administered 2021-08-23: 500 [IU]

## 2021-08-23 MED ORDER — SODIUM CHLORIDE 0.9 % IV SOLN: Freq: Once | INTRAVENOUS | Status: AC

## 2021-08-23 MED ORDER — ACETAMINOPHEN 325 MG PO TABS
650.0000 mg | ORAL_TABLET | Freq: Once | ORAL | Status: AC
  Administered 2021-08-23: 650 mg via ORAL
  Filled 2021-08-23: qty 2

## 2021-08-23 MED ORDER — DIPHENHYDRAMINE HCL 25 MG PO CAPS
25.0000 mg | ORAL_CAPSULE | Freq: Once | ORAL | Status: AC
  Administered 2021-08-23: 25 mg via ORAL
  Filled 2021-08-23: qty 1

## 2021-08-23 MED ORDER — SODIUM CHLORIDE 0.9% FLUSH
10.0000 mL | INTRAVENOUS | Status: DC | PRN
Start: 2021-08-23 — End: 2021-08-23
  Administered 2021-08-23: 10 mL

## 2021-08-23 MED ORDER — TRASTUZUMAB-DKST CHEMO 150 MG IV SOLR
6.0000 mg/kg | Freq: Once | INTRAVENOUS | Status: AC
  Administered 2021-08-23: 462 mg via INTRAVENOUS
  Filled 2021-08-23: qty 22

## 2021-08-23 MED ORDER — SODIUM CHLORIDE 0.9 % IV SOLN
420.0000 mg | Freq: Once | INTRAVENOUS | Status: AC
  Administered 2021-08-23: 420 mg via INTRAVENOUS
  Filled 2021-08-23: qty 14

## 2021-08-23 NOTE — Assessment & Plan Note (Signed)
12/19/2020: Palpable left breast mass and left axillary tenderness.  Mammogram revealed conglomeration of masses 3.9 cm and an adjacent 0.7 cm mass and 2 axillary lymph nodes that were abnormal. °Biopsy revealed IDC with DCIS, grade 3, ER/PR negative, HER-2 +3+ by IHC °  °12/24/2020: Breast MRI revealed mass or non-mass enhancement measuring 9.5 cm and 3 abnormal lymph nodes °01-02-21: Bone scan: No bone mets °01/01/2021: CT CAP: No distant metastatic disease °  °Treatment Plan: °1. Neoadjuvant chemotherapy with TCH Perjeta ×6 cycles followed by Herceptin Perjeta maintenance  °2. left mastectomy 06/04/2021: Dr. Wakefield: Complete pathologic response, no residual cancer, 0/1 lymph node negative °3. Followed by adjuvant radiation therapy completed 08/22/2021 °-------------------------------------------------------------------------------------------------------------------------------- °Current treatment: Herceptin Perjeta maintenance °Toxicities: None °Return to clinic every 3 weeks for Herceptin and Perjeta and every 6 weeks with follow-up with me. °

## 2021-08-23 NOTE — Patient Instructions (Signed)
[facility]   Discharge Instructions: Thank you for choosing [facility] to provide your oncology and hematology care.   If you have a lab appointment with the [facility], please go directly to the [facility] and check in at the registration area.   Wear comfortable clothing and clothing appropriate for easy access to any Portacath or PICC line.   We strive to give you quality time with your provider. You may need to reschedule your appointment if you arrive late (15 or more minutes).  Arriving late affects you and other patients whose appointments are after yours.  Also, if you miss three or more appointments without notifying the office, you may be dismissed from the clinic at the provider's discretion.      For prescription refill requests, have your pharmacy contact our office and allow 72 hours for refills to be completed.    Today you received the following chemotherapy and/or immunotherapy agents: trastuzumab-dkst and pertuzumab.      To help prevent nausea and vomiting after your treatment, we encourage you to take your nausea medication as directed.  BELOW ARE SYMPTOMS THAT SHOULD BE REPORTED IMMEDIATELY: *FEVER GREATER THAN 100.4 F (38 C) OR HIGHER *CHILLS OR SWEATING *NAUSEA AND VOMITING THAT IS NOT CONTROLLED WITH YOUR NAUSEA MEDICATION *UNUSUAL SHORTNESS OF BREATH *UNUSUAL BRUISING OR BLEEDING *URINARY PROBLEMS (pain or burning when urinating, or frequent urination) *BOWEL PROBLEMS (unusual diarrhea, constipation, pain near the anus) TENDERNESS IN MOUTH AND THROAT WITH OR WITHOUT PRESENCE OF ULCERS (sore throat, sores in mouth, or a toothache) UNUSUAL RASH, SWELLING OR PAIN  UNUSUAL VAGINAL DISCHARGE OR ITCHING   Items with * indicate a potential emergency and should be followed up as soon as possible or go to the Emergency Department if any problems should occur.  Please show the CHEMOTHERAPY ALERT CARD or IMMUNOTHERAPY  ALERT CARD at check-in to the Emergency Department and triage nurse.  Should you have questions after your visit or need to cancel or reschedule your appointment, please contact [facility]  Dept: [phone]  and follow the prompts.  Office hours are 8:00 a.m. to 4:30 p.m. Monday - Friday. Please note that voicemails left after 4:00 p.m. may not be returned until the following business day.  We are closed weekends and major holidays. You have access to a nurse at all times for urgent questions. Please call the main number to the clinic Dept: [phone] and follow the prompts.   For any non-urgent questions, you may also contact your provider using MyChart. We now offer e-Visits for anyone 67 and older to request care online for non-urgent symptoms. For details visit [email or web address].   Also download the MyChart app! Go to the app store, search "MyChart", open the app, select [facility], and log in with your MyChart username and password.  Due to Covid, a mask is required upon entering the hospital/clinic. If you do not have a mask, one will be given to you upon arrival. For doctor visits, patients may have 1 support person aged 24 or older with them. For treatment visits, patients cannot have anyone with them due to current Covid guidelines and our immunocompromised population.

## 2021-08-26 ENCOUNTER — Ambulatory Visit: Payer: 59

## 2021-09-07 ENCOUNTER — Encounter: Payer: Self-pay | Admitting: Hematology and Oncology

## 2021-09-09 ENCOUNTER — Other Ambulatory Visit: Payer: Self-pay

## 2021-09-09 ENCOUNTER — Ambulatory Visit: Payer: 59 | Attending: Plastic Surgery

## 2021-09-09 VITALS — Wt 181.5 lb

## 2021-09-09 DIAGNOSIS — Z483 Aftercare following surgery for neoplasm: Secondary | ICD-10-CM | POA: Insufficient documentation

## 2021-09-09 NOTE — Therapy (Signed)
[facility] [place], [name], [address] Phone: [phone]   Fax:  [phone]  Physical Therapy Treatment  Patient Details  Name: Maria Kennedy MRN: [id number] Date of Birth: 1971-03-23 Referring Provider (PT): Dr Iran Planas   Encounter Date: 09/09/2021   PT End of Session - 09/09/21 1604     Visit Number 4   # unchanged due to screen only   PT Start Time 1601    PT Stop Time 1610    PT Time Calculation (min) 9 min    Activity Tolerance Patient tolerated treatment well    Behavior During Therapy WFL for tasks assessed/performed             Past Medical History:  Diagnosis Date   ADHD (attention deficit hyperactivity disorder)    Asthma    Cancer ([facility])    breast ca   Depression    Family history of breast cancer    Family history of leukemia    Family history of lung cancer    Family history of pancreatic cancer     Past Surgical History:  Procedure Laterality Date   BREAST RECONSTRUCTION WITH PLACEMENT OF TISSUE EXPANDER AND ALLODERM Left 06/04/2021   Procedure: BREAST RECONSTRUCTION WITH PLACEMENT OF TISSUE EXPANDER AND ALLODERM;  Surgeon: Irene Limbo, MD;  Location: [facility];  Service: Plastics;  Laterality: Left;   MASTECTOMY WITH RADIOACTIVE SEED GUIDED EXCISION AND AXILLARY SENTINEL LYMPH NODE BIOPSY Left 06/04/2021   Procedure: LEFT MASTECTOMY, LEFT AXILLARY SENTINEL LYMPH NODE BIOPSY, LEFT AXILLARY NODE SEED GUIDED EXCISION;  Surgeon: Rolm Bookbinder, MD;  Location: [facility];  Service: General;  Laterality: Left;   PORTACATH PLACEMENT Right 01/07/2021   Procedure: INSERTION PORT-A-CATH;  Surgeon: Rolm Bookbinder, MD;  Location: [facility];  Service: General;  Laterality: Right;   TUBAL LIGATION      Vitals:   09/09/21 1603  Weight: 181 lb 8 oz (82.3 kg)     Subjective Assessment - 09/09/21 1602     Subjective Pt returns  for her 3 month L-Dex screen.    Pertinent History History of left mastectomy with expander and 0/1 lymph nodes positive on 06/04/21.  Completed neoadjuvant chemotherapy and will have radiation                    L-DEX FLOWSHEETS - 09/09/21 1600       L-DEX LYMPHEDEMA SCREENING   Measurement Type Unilateral    L-DEX MEASUREMENT EXTREMITY Upper Extremity    POSITION  Standing    DOMINANT SIDE Left    At Risk Side Left    BASELINE SCORE (UNILATERAL) -5.2    L-DEX SCORE (UNILATERAL) -4.4    VALUE CHANGE (UNILAT) 0.8                                     PT Long Term Goals - 07/30/21 1607       PT LONG TERM GOAL #1   Title Patient will demonstrate she has regained full shoulder ROM and function post operatively compared to baselines.    Status Achieved      PT LONG TERM GOAL #2   Title Pt will be ind with final HEP for use during and after radiation    Status Achieved      PT LONG TERM GOAL #3   Title Pt will  be educated on risk reduction, lymphedema risk, and use of SOZO    Status Achieved                   Plan - 09/09/21 1611     Clinical Impression Statement Pt returns for her 3 month L-Dex screen. Her change from baseline of 0.8 is WNLs so no furhter treatment is required at this time except to cont every 3 month L-Dex screens which pt is agreeable to.    PT Next Visit Plan Cont every 3 month L-Dex screens for up to 2 years from her SLNB (~06/05/23)    Consulted and Agree with Plan of Care Patient             Patient will benefit from skilled therapeutic intervention in order to improve the following deficits and impairments:     Visit Diagnosis: Aftercare following surgery for neoplasm     Problem List Patient Active Problem List   Diagnosis Date Noted   S/P mastectomy, left 06/04/2021   Genetic testing 01/18/2021   Port-A-Cath in place 01/15/2021   Family history of breast cancer    Family history of lung  cancer    Family history of pancreatic cancer    Family history of leukemia    Malignant neoplasm of upper-outer quadrant of left breast in female, estrogen receptor negative ([facility]) 12/24/2020   Benign paroxysmal positional vertigo of left ear 11/15/2018   ADD (attention deficit disorder) 08/11/2011    Otelia Limes, PTA 09/09/2021, 4:12 PM  [facility] @ [place], [name], [address] Phone: [phone]   Fax:  [phone]  Name: Maria Kennedy MRN: [id number] Date of Birth: 08-02-1971

## 2021-09-12 ENCOUNTER — Encounter: Payer: Self-pay | Admitting: *Deleted

## 2021-09-13 ENCOUNTER — Inpatient Hospital Stay: Payer: 59 | Attending: Hematology and Oncology

## 2021-09-13 ENCOUNTER — Other Ambulatory Visit: Payer: Self-pay

## 2021-09-13 VITALS — BP 119/88 | HR 87 | Temp 98.4°F | Resp 18 | Wt 182.8 lb

## 2021-09-13 DIAGNOSIS — Z171 Estrogen receptor negative status [ER-]: Secondary | ICD-10-CM | POA: Insufficient documentation

## 2021-09-13 DIAGNOSIS — Z79899 Other long term (current) drug therapy: Secondary | ICD-10-CM | POA: Insufficient documentation

## 2021-09-13 DIAGNOSIS — Z5112 Encounter for antineoplastic immunotherapy: Secondary | ICD-10-CM | POA: Insufficient documentation

## 2021-09-13 DIAGNOSIS — C50412 Malignant neoplasm of upper-outer quadrant of left female breast: Secondary | ICD-10-CM | POA: Insufficient documentation

## 2021-09-13 DIAGNOSIS — Z923 Personal history of irradiation: Secondary | ICD-10-CM | POA: Diagnosis not present

## 2021-09-13 DIAGNOSIS — C773 Secondary and unspecified malignant neoplasm of axilla and upper limb lymph nodes: Secondary | ICD-10-CM | POA: Insufficient documentation

## 2021-09-13 DIAGNOSIS — Z9012 Acquired absence of left breast and nipple: Secondary | ICD-10-CM | POA: Insufficient documentation

## 2021-09-13 DIAGNOSIS — R059 Cough, unspecified: Secondary | ICD-10-CM | POA: Diagnosis not present

## 2021-09-13 DIAGNOSIS — Z9221 Personal history of antineoplastic chemotherapy: Secondary | ICD-10-CM | POA: Diagnosis not present

## 2021-09-13 MED ORDER — SODIUM CHLORIDE 0.9% FLUSH
10.0000 mL | INTRAVENOUS | Status: DC | PRN
  Administered 2021-09-13: 10 mL

## 2021-09-13 MED ORDER — SODIUM CHLORIDE 0.9 % IV SOLN
420.0000 mg | Freq: Once | INTRAVENOUS | Status: AC
  Administered 2021-09-13: 420 mg via INTRAVENOUS
  Filled 2021-09-13: qty 14

## 2021-09-13 MED ORDER — ACETAMINOPHEN 325 MG PO TABS
650.0000 mg | ORAL_TABLET | Freq: Once | ORAL | Status: AC
  Administered 2021-09-13: 650 mg via ORAL
  Filled 2021-09-13: qty 2

## 2021-09-13 MED ORDER — DIPHENHYDRAMINE HCL 25 MG PO CAPS
25.0000 mg | ORAL_CAPSULE | Freq: Once | ORAL | Status: AC
  Administered 2021-09-13: 25 mg via ORAL
  Filled 2021-09-13: qty 1

## 2021-09-13 MED ORDER — HEPARIN SOD (PORK) LOCK FLUSH 100 UNIT/ML IV SOLN
500.0000 [IU] | Freq: Once | INTRAVENOUS | Status: AC | PRN
  Administered 2021-09-13: 500 [IU]

## 2021-09-13 MED ORDER — TRASTUZUMAB-DKST CHEMO 150 MG IV SOLR
6.0000 mg/kg | Freq: Once | INTRAVENOUS | Status: AC
  Administered 2021-09-13: 462 mg via INTRAVENOUS
  Filled 2021-09-13: qty 22

## 2021-09-13 MED ORDER — SODIUM CHLORIDE 0.9 % IV SOLN: Freq: Once | INTRAVENOUS | Status: AC

## 2021-09-13 NOTE — Patient Instructions (Signed)
[facility]  Discharge Instructions: Thank you for choosing [facility] to provide your oncology and hematology care.   If you have a lab appointment with the [facility], please go directly to the [facility] and check in at the registration area.   Wear comfortable clothing and clothing appropriate for easy access to any Portacath or PICC line.   We strive to give you quality time with your provider. You may need to reschedule your appointment if you arrive late (15 or more minutes).  Arriving late affects you and other patients whose appointments are after yours.  Also, if you miss three or more appointments without notifying the office, you may be dismissed from the clinic at the provider's discretion.      For prescription refill requests, have your pharmacy contact our office and allow 72 hours for refills to be completed.    Today you received the following chemotherapy and/or immunotherapy agents Trastuzumab and Perjeta      To help prevent nausea and vomiting after your treatment, we encourage you to take your nausea medication as directed.  BELOW ARE SYMPTOMS THAT SHOULD BE REPORTED IMMEDIATELY: *FEVER GREATER THAN 100.4 F (38 C) OR HIGHER *CHILLS OR SWEATING *NAUSEA AND VOMITING THAT IS NOT CONTROLLED WITH YOUR NAUSEA MEDICATION *UNUSUAL SHORTNESS OF BREATH *UNUSUAL BRUISING OR BLEEDING *URINARY PROBLEMS (pain or burning when urinating, or frequent urination) *BOWEL PROBLEMS (unusual diarrhea, constipation, pain near the anus) TENDERNESS IN MOUTH AND THROAT WITH OR WITHOUT PRESENCE OF ULCERS (sore throat, sores in mouth, or a toothache) UNUSUAL RASH, SWELLING OR PAIN  UNUSUAL VAGINAL DISCHARGE OR ITCHING   Items with * indicate a potential emergency and should be followed up as soon as possible or go to the Emergency Department if any problems should occur.  Please show the CHEMOTHERAPY ALERT CARD or IMMUNOTHERAPY ALERT CARD at  check-in to the Emergency Department and triage nurse.  Should you have questions after your visit or need to cancel or reschedule your appointment, please contact [facility]  Dept: [phone]  and follow the prompts.  Office hours are 8:00 a.m. to 4:30 p.m. Monday - Friday. Please note that voicemails left after 4:00 p.m. may not be returned until the following business day.  We are closed weekends and major holidays. You have access to a nurse at all times for urgent questions. Please call the main number to the clinic Dept: [phone] and follow the prompts.   For any non-urgent questions, you may also contact your provider using MyChart. We now offer e-Visits for anyone 35 and older to request care online for non-urgent symptoms. For details visit [email or web address].   Also download the MyChart app! Go to the app store, search "MyChart", open the app, select [facility], and log in with your MyChart username and password.  Due to Covid, a mask is required upon entering the hospital/clinic. If you do not have a mask, one will be given to you upon arrival. For doctor visits, patients may have 1 support person aged 80 or older with them. For treatment visits, patients cannot have anyone with them due to current Covid guidelines and our immunocompromised population.

## 2021-09-27 ENCOUNTER — Ambulatory Visit: Payer: Self-pay | Admitting: Radiation Oncology

## 2021-09-27 ENCOUNTER — Telehealth: Payer: Self-pay

## 2021-09-27 NOTE — Telephone Encounter (Signed)
I called the patient today about her upcoming follow-up appointment in radiation oncology.   Given the state of the COVID-19 pandemic, concerning case numbers in our community, and guidance from [facility], I offered a phone assessment with the patient to determine if coming to the clinic was necessary. She accepted.  The patient denies any symptomatic concerns other than a dry cough that occurs in the evenings.  She reports lingering fatigue, but she restarted her vitamin D supplement and reports a slight improvement. Reports slight tenderness to her breast, but states it's manageable. Does admit to stiffness to her left shoulder, but attributes that to not being able to do her PT exercises regularly. Specifically, she reports good healing of her skin in the radiation fields.  Skin is intact and almost completely back to her baseline color/texture. I recommended that she continue skin care by applying oil or lotion with vitamin E to the skin in the radiation fields, BID, for 2 more months.  Patient verbalized understanding and agreement.   Continue follow-up with medical oncology - follow-up is scheduled on 10/04/2021 with Dr. Nicholas Lose  before her next infusion appointment.  I explained that yearly mammograms are important for patients with intact breast tissue, and physical exams are important after mastectomy for patients that cannot undergo mammography.  I encouraged her to call if she had further questions or concerns about her healing. Otherwise, she will follow-up PRN in radiation oncology. Patient is pleased with this plan, and we will cancel her upcoming follow-up to reduce the risk of COVID-19 transmission.

## 2021-10-02 NOTE — Progress Notes (Signed)
Patient Care Team: London Pepper, MD as PCP - General (Family Medicine) Mauro Kaufmann, RN as Oncology Nurse Navigator Rockwell Germany, RN as Oncology Nurse Navigator Rolm Bookbinder, MD as Consulting Physician (General Surgery) Nicholas Lose, MD as Consulting Physician (Hematology and Oncology) Eppie Gibson, MD as Attending Physician (Radiation Oncology)  DIAGNOSIS:    ICD-10-CM   1. Malignant neoplasm of upper-outer quadrant of left breast in female, estrogen receptor negative ([place])  C50.412    Z17.1       SUMMARY OF ONCOLOGIC HISTORY: Oncology History  Malignant neoplasm of upper-outer quadrant of left breast in female, estrogen receptor negative ([place])  12/19/2020 Initial Diagnosis   Patient palpated a left breast mass and noted left axilla tenderness for one week. Diagnostic mammogram and US showed a conglomeration of masses in the left breast at the 2 o'clock position 3-4cm from the nipple spanning 3.9cm total, with a 0.7cm mass at the 2 o'clock position 6cm from the nipple, and two abnormal axillary lymph nodes. 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   12/26/2020 Cancer Staging   Staging form: Breast, AJCC 8th Edition - Clinical stage from 12/26/2020: Stage IIIA (cT3, cN1, cM0, G3, ER-, PR-, HER2+) - Signed by Nicholas Lose, MD on 12/26/2020 Stage prefix: Initial diagnosis Histologic grading system: 3 grade system    01/08/2021 - 05/14/2021 Chemotherapy   TCHP x6 cycles followed by Herceptin Perjeta maintenance        01/18/2021 Genetic Testing   Negative genetic testing:  No pathogenic variants detected on the [employer] CancerNext-Expanded + RNAinsight panel. The report date is 01/18/2021.   The CancerNext-Expanded + RNAinsight gene panel offered by [employer] and includes sequencing and rearrangement analysis for the following 77 genes: AIP, ALK, APC, ATM, AXIN2, BAP1, BARD1, BLM, BMPR1A, BRCA1, BRCA2,  BRIP1, CDC73, CDH1, CDK4, CDKN1B, CDKN2A, CHEK2, CTNNA1, DICER1, FANCC, FH, FLCN, GALNT12, KIF1B, LZTR1, MAX, MEN1, MET, MLH1, MSH2, MSH3, MSH6, MUTYH, NBN, NF1, NF2, NTHL1, PALB2, PHOX2B, PMS2, POT1, PRKAR1A, PTCH1, PTEN, RAD51C, RAD51D, RB1, RECQL, RET, SDHA, SDHAF2, SDHB, SDHC, SDHD, SMAD4, SMARCA4, SMARCB1, SMARCE1, STK11, SUFU, TMEM127, TP53, TSC1, TSC2, VHL and XRCC2 (sequencing and deletion/duplication); EGFR, EGLN1, HOXB13, KIT, MITF, PDGFRA, POLD1 and POLE (sequencing only); EPCAM and GREM1 (deletion/duplication only). RNA data is routinely analyzed for use in variant interpretation for all genes.   06/22/2021 -  Chemotherapy   Patient is on Treatment Plan : BREAST Trastuzumab + Pertuzumab q21d     07/16/2021 - 08/22/2021 Radiation Therapy   Adjuvant radiation     CHIEF COMPLIANT: Follow-up of breast cancer  INTERVAL HISTORY: Maria Kennedy is a 50 y.o. with above-mentioned history of breast cancer currently on neoadjuvant chemotherapy with Herceptin and Perjeta. She presents to the clinic today for follow-up and toxicity check.   ALLERGIES:  is allergic to latex.  MEDICATIONS:  Current Outpatient Medications  Medication Sig Dispense Refill   acetaminophen (TYLENOL) 650 MG CR tablet Take 650 mg by mouth every 8 (eight) hours as needed for pain.     albuterol (VENTOLIN HFA) 108 (90 Base) MCG/ACT inhaler Inhale 2 puffs into the lungs every 6 (six) hours as needed for wheezing or shortness of breath.     amphetamine-dextroamphetamine (ADDERALL XR) 20 MG 24 hr capsule Take 20 mg by mouth daily as needed.     LORazepam (ATIVAN) 0.5 MG tablet Take 1 tablet (0.5 mg total) by mouth at bedtime. 30 tablet 3   Menaquinone-7 (  VITAMIN K2) 40 MCG TABS Take 2 tablets by mouth daily. 80 mcg daily total     VITAMIN D PO Take 5,000 Int'l Units by mouth every other day.     No current facility-administered medications for this visit.    PHYSICAL EXAMINATION: ECOG PERFORMANCE STATUS: 1 - Symptomatic  but completely ambulatory  Vitals:   10/04/21 0849  BP: (!) 135/98  Pulse: 81  Resp: 18  Temp: (!) 97.3 F (36.3 C)  SpO2: 99%   Filed Weights   10/04/21 0849  Weight: 184 lb 9.6 oz (83.7 kg)    BREAST: No palpable masses or nodules in either right or left breasts. No palpable axillary supraclavicular or infraclavicular adenopathy no breast tenderness or nipple discharge. (exam performed in the presence of a chaperone)  LABORATORY DATA:  I have reviewed the data as listed CMP Latest Ref Rng & Units 08/02/2021 05/14/2021 04/23/2021  Glucose 70 - 99 mg/dL 109(H) 93 197(H)  BUN 6 - 20 mg/dL 14 12 11   Creatinine 0.44 - 1.00 mg/dL 0.65 0.76 0.82  Sodium 135 - 145 mmol/L 141 140 140  Potassium 3.5 - 5.1 mmol/L 4.0 3.9 4.3  Chloride 98 - 111 mmol/L 108 106 106  CO2 22 - 32 mmol/L 26 25 25   Calcium 8.9 - 10.3 mg/dL 9.5 9.3 10.1  Total Protein 6.5 - 8.1 g/dL 7.3 6.5 7.0  Total Bilirubin 0.3 - 1.2 mg/dL 0.5 0.3 0.3  Alkaline Phos 38 - 126 U/L 49 53 56  AST 15 - 41 U/L 18 27 16   ALT 0 - 44 U/L 18 24 16     Lab Results  Component Value Date   WBC 4.8 10/04/2021   HGB 11.0 (L) 10/04/2021   HCT 34.9 (L) 10/04/2021   MCV 86.2 10/04/2021   PLT 306 10/04/2021   NEUTROABS 3.1 10/04/2021    ASSESSMENT & PLAN:  Malignant neoplasm of upper-outer quadrant of left breast in female, estrogen receptor negative ([place]) 12/19/2020: Palpable left breast mass and left axillary tenderness.  Mammogram revealed conglomeration of masses 3.9 cm and an adjacent 0.7 cm mass and 2 axillary lymph nodes that were abnormal. Biopsy revealed IDC with DCIS, grade 3, ER/PR negative, HER-2 +3+ by [facility]   12/24/2020: Breast MRI revealed mass or non-mass enhancement measuring 9.5 cm and 3 abnormal lymph nodes 01-02-21: Bone scan: No bone mets 01/01/2021: CT CAP: No distant metastatic disease   Treatment Plan: 1. Neoadjuvant chemotherapy with TCH Perjeta 6 cycles followed by Herceptin Perjeta maintenance  2. left  mastectomy 06/04/2021: Dr. Donne Hazel: Complete pathologic response, no residual cancer, 0/1 lymph node negative 3. Followed by adjuvant radiation therapy completed 08/22/2021 -------------------------------------------------------------------------------------------------------------------------------- Current treatment: Herceptin Perjeta maintenance Toxicities: None Send a prescription for vitamin D 50,000 units Radiation related cough: Sent the prescription for Medrol Dosepak Return to clinic every 3 weeks for Herceptin and Perjeta and every 6 weeks with follow-up with me.    No orders of the defined types were placed in this encounter.  The patient has a good understanding of the overall plan. she agrees with it. she will call with any problems that may develop before the next visit here.  Total time spent: 20 mins including face to face time and time spent for planning, charting and coordination of care  Rulon Eisenmenger, MD, MPH 10/04/2021  I, Thana Ates, am acting as scribe for Dr. Nicholas Lose.  I have reviewed the above documentation for accuracy and completeness, and I agree with the above.

## 2021-10-03 ENCOUNTER — Encounter: Payer: Self-pay | Admitting: Hematology and Oncology

## 2021-10-03 NOTE — Progress Notes (Signed)
° °                                                                                                                                                          °  Patient Name: Maria Kennedy MRN: [id number] DOB: Feb 18, 1971 Referring Physician: Delena Bali (Profile Not Attached) Date of Service: 08/22/2021 [facility],                                                         End Of Treatment Note  Diagnoses: C50.412-Malignant neoplasm of upper-outer quadrant of left female breast  Cancer Staging:  Cancer Staging  Malignant neoplasm of upper-outer quadrant of left breast in female, estrogen receptor negative ([place]) Staging form: Breast, AJCC 8th Edition - Clinical stage from 12/26/2020: Stage IIIA (cT3, cN1, cM0, G3, ER-, PR-, HER2+) - Signed by Nicholas Lose, MD on 12/26/2020 Stage prefix: Initial diagnosis Histologic grading system: 3 grade system  Intent: Curative  Radiation Treatment Dates: 07/16/2021 through 08/22/2021 Site Technique Total Dose (Gy) Dose per Fx (Gy) Completed Fx Beam Energies  Chest Wall, Left: CW_Lt 3D 50.4/50.4 1.8 28/28 10X  Chest Wall, Left: CW_Lt_SCV_PAB 3D 50.4/50.4 1.8 28/28 6X, 10X   Narrative: The patient tolerated radiation therapy relatively well.   Plan: The patient will follow-up with radiation oncology in 40mo. -----------------------------------  Eppie Gibson, MD

## 2021-10-03 NOTE — Assessment & Plan Note (Signed)
12/19/2020: Palpable left breast mass and left axillary tenderness. Mammogram revealed conglomeration of masses 3.9 cm and an adjacent 0.7 cm mass and 2 axillary lymph nodes that were abnormal. Biopsy revealed IDC with DCIS, grade 3, ER/PR negative, HER-2 +3+ by [facility]  12/24/2020: Breast MRI revealed mass or non-mass enhancement measuring 9.5 cm and 3 abnormal lymph nodes 01-02-21: Bone scan: No bone mets 01/01/2021: CT CAP: No distant metastatic disease  Treatment Plan: 1. Neoadjuvant chemotherapy with TCH Perjeta 6 cycles followed by Herceptin Perjeta maintenance  2.left mastectomy 06/04/2021: Dr. Donne Hazel: Complete pathologic response, no residual cancer, 0/1 lymph node negative 3. Followed by adjuvant radiation therapycompleted 08/22/2021 -------------------------------------------------------------------------------------------------------------------------------- Current treatment: Herceptin Perjeta maintenance Toxicities: None Return to clinic every 3 weeks for Herceptin and Perjeta and every 6 weeks with follow-up with me.

## 2021-10-04 ENCOUNTER — Inpatient Hospital Stay: Payer: 59

## 2021-10-04 ENCOUNTER — Inpatient Hospital Stay (HOSPITAL_BASED_OUTPATIENT_CLINIC_OR_DEPARTMENT_OTHER): Payer: 59 | Admitting: Hematology and Oncology

## 2021-10-04 ENCOUNTER — Other Ambulatory Visit: Payer: Self-pay

## 2021-10-04 DIAGNOSIS — C50412 Malignant neoplasm of upper-outer quadrant of left female breast: Secondary | ICD-10-CM

## 2021-10-04 DIAGNOSIS — Z95828 Presence of other vascular implants and grafts: Secondary | ICD-10-CM

## 2021-10-04 DIAGNOSIS — Z171 Estrogen receptor negative status [ER-]: Secondary | ICD-10-CM

## 2021-10-04 LAB — CBC WITH DIFFERENTIAL (CANCER CENTER ONLY)
Abs Immature Granulocytes: 0.01 10*3/uL (ref 0.00–0.07)
Basophils Absolute: 0 10*3/uL (ref 0.0–0.1)
Basophils Relative: 0 %
Eosinophils Absolute: 0.1 10*3/uL (ref 0.0–0.5)
Eosinophils Relative: 2 %
HCT: 34.9 % — ABNORMAL LOW (ref 36.0–46.0)
Hemoglobin: 11 g/dL — ABNORMAL LOW (ref 12.0–15.0)
Immature Granulocytes: 0 %
Lymphocytes Relative: 26 %
Lymphs Abs: 1.2 10*3/uL (ref 0.7–4.0)
MCH: 27.2 pg (ref 26.0–34.0)
MCHC: 31.5 g/dL (ref 30.0–36.0)
MCV: 86.2 fL (ref 80.0–100.0)
Monocytes Absolute: 0.3 10*3/uL (ref 0.1–1.0)
Monocytes Relative: 7 %
Neutro Abs: 3.1 10*3/uL (ref 1.7–7.7)
Neutrophils Relative %: 65 %
Platelet Count: 306 10*3/uL (ref 150–400)
RBC: 4.05 MIL/uL (ref 3.87–5.11)
RDW: 15.9 % — ABNORMAL HIGH (ref 11.5–15.5)
WBC Count: 4.8 10*3/uL (ref 4.0–10.5)
nRBC: 0 % (ref 0.0–0.2)

## 2021-10-04 LAB — CMP (CANCER CENTER ONLY)
ALT: 14 U/L (ref 0–44)
AST: 14 U/L — ABNORMAL LOW (ref 15–41)
Albumin: 4.3 g/dL (ref 3.5–5.0)
Alkaline Phosphatase: 56 U/L (ref 38–126)
Anion gap: 7 (ref 5–15)
BUN: 19 mg/dL (ref 6–20)
CO2: 27 mmol/L (ref 22–32)
Calcium: 9.4 mg/dL (ref 8.9–10.3)
Chloride: 107 mmol/L (ref 98–111)
Creatinine: 0.87 mg/dL (ref 0.44–1.00)
GFR, Estimated: >60 mL/min (ref >60)
Glucose, Bld: 122 mg/dL — ABNORMAL HIGH (ref 70–99)
Potassium: 3.9 mmol/L (ref 3.5–5.1)
Sodium: 141 mmol/L (ref 135–145)
Total Bilirubin: 0.3 mg/dL (ref 0.3–1.2)
Total Protein: 7.1 g/dL (ref 6.5–8.1)

## 2021-10-04 MED ORDER — SODIUM CHLORIDE 0.9% FLUSH
10.0000 mL | Freq: Once | INTRAVENOUS | Status: AC
Start: 2021-10-04 — End: 2021-10-04
  Administered 2021-10-04: 09:00:00 10 mL

## 2021-10-04 MED ORDER — ERGOCALCIFEROL 1.25 MG (50000 UT) PO CAPS: 50000.0000 [IU] | ORAL_CAPSULE | ORAL | 3 refills | Status: DC

## 2021-10-04 MED ORDER — DIPHENHYDRAMINE HCL 25 MG PO CAPS
25.0000 mg | ORAL_CAPSULE | Freq: Once | ORAL | Status: AC
  Administered 2021-10-04: 10:00:00 25 mg via ORAL
  Filled 2021-10-04: qty 1

## 2021-10-04 MED ORDER — ACETAMINOPHEN 325 MG PO TABS
650.0000 mg | ORAL_TABLET | Freq: Once | ORAL | Status: AC
  Administered 2021-10-04: 10:00:00 650 mg via ORAL
  Filled 2021-10-04: qty 2

## 2021-10-04 MED ORDER — SODIUM CHLORIDE 0.9 % IV SOLN
420.0000 mg | Freq: Once | INTRAVENOUS | Status: AC
  Administered 2021-10-04: 11:00:00 420 mg via INTRAVENOUS
  Filled 2021-10-04: qty 14

## 2021-10-04 MED ORDER — TRASTUZUMAB-DKST CHEMO 150 MG IV SOLR
6.0000 mg/kg | Freq: Once | INTRAVENOUS | Status: AC
  Administered 2021-10-04: 10:00:00 462 mg via INTRAVENOUS
  Filled 2021-10-04: qty 22

## 2021-10-04 MED ORDER — SODIUM CHLORIDE 0.9 % IV SOLN: Freq: Once | INTRAVENOUS | Status: AC

## 2021-10-04 MED ORDER — SODIUM CHLORIDE 0.9% FLUSH
10.0000 mL | INTRAVENOUS | Status: DC | PRN
  Administered 2021-10-04: 12:00:00 10 mL

## 2021-10-04 MED ORDER — HEPARIN SOD (PORK) LOCK FLUSH 100 UNIT/ML IV SOLN
500.0000 [IU] | Freq: Once | INTRAVENOUS | Status: AC | PRN
  Administered 2021-10-04: 12:00:00 500 [IU]

## 2021-10-04 MED ORDER — METHYLPREDNISOLONE 4 MG PO TBPK: ORAL_TABLET | ORAL | 0 refills | Status: DC

## 2021-10-04 NOTE — Patient Instructions (Signed)
[place] ONCOLOGY  Discharge Instructions: Thank you for choosing [facility] to provide your oncology and hematology care.   If you have a lab appointment with the [facility], please go directly to the [facility] and check in at the registration area.   Wear comfortable clothing and clothing appropriate for easy access to any Portacath or PICC line.   We strive to give you quality time with your provider. You may need to reschedule your appointment if you arrive late (15 or more minutes).  Arriving late affects you and other patients whose appointments are after yours.  Also, if you miss three or more appointments without notifying the office, you may be dismissed from the clinic at the providers discretion.      For prescription refill requests, have your pharmacy contact our office and allow 72 hours for refills to be completed.    Today you received the following chemotherapy and/or immunotherapy agents Herceptin and Perjeta      To help prevent nausea and vomiting after your treatment, we encourage you to take your nausea medication as directed.  BELOW ARE SYMPTOMS THAT SHOULD BE REPORTED IMMEDIATELY: *FEVER GREATER THAN 100.4 F (38 C) OR HIGHER *CHILLS OR SWEATING *NAUSEA AND VOMITING THAT IS NOT CONTROLLED WITH YOUR NAUSEA MEDICATION *UNUSUAL SHORTNESS OF BREATH *UNUSUAL BRUISING OR BLEEDING *URINARY PROBLEMS (pain or burning when urinating, or frequent urination) *BOWEL PROBLEMS (unusual diarrhea, constipation, pain near the anus) TENDERNESS IN MOUTH AND THROAT WITH OR WITHOUT PRESENCE OF ULCERS (sore throat, sores in mouth, or a toothache) UNUSUAL RASH, SWELLING OR PAIN  UNUSUAL VAGINAL DISCHARGE OR ITCHING   Items with * indicate a potential emergency and should be followed up as soon as possible or go to the Emergency Department if any problems should occur.  Please show the CHEMOTHERAPY ALERT CARD or IMMUNOTHERAPY ALERT CARD at  check-in to the Emergency Department and triage nurse.  Should you have questions after your visit or need to cancel or reschedule your appointment, please contact [facility]  Dept: [phone]  and follow the prompts.  Office hours are 8:00 a.m. to 4:30 p.m. Monday - Friday. Please note that voicemails left after 4:00 p.m. may not be returned until the following business day.  We are closed weekends and major holidays. You have access to a nurse at all times for urgent questions. Please call the main number to the clinic Dept: [phone] and follow the prompts.   For any non-urgent questions, you may also contact your provider using MyChart. We now offer e-Visits for anyone 48 and older to request care online for non-urgent symptoms. For details visit [email or web address].   Also download the MyChart app! Go to the app store, search "MyChart", open the app, select [facility], and log in with your MyChart username and password.  Due to Covid, a mask is required upon entering the hospital/clinic. If you do not have a mask, one will be given to you upon arrival. For doctor visits, patients may have 1 support person aged 48 or older with them. For treatment visits, patients cannot have anyone with them due to current Covid guidelines and our immunocompromised population.

## 2021-10-13 ENCOUNTER — Encounter: Payer: Self-pay | Admitting: Hematology and Oncology

## 2021-10-15 ENCOUNTER — Other Ambulatory Visit: Payer: Self-pay

## 2021-10-15 MED ORDER — MOLNUPIRAVIR EUA 200MG CAPSULE: 4.0000 | ORAL_CAPSULE | Freq: Two times a day (BID) | ORAL | 0 refills | Status: AC

## 2021-10-15 NOTE — Progress Notes (Signed)
Pt called and reports she tested positive for COVID 10/13/21 but sx started 10/10/21. Pt states she has "horrible cough" and is asking for relief. Per MD, Molnupiravir sent in to pt's preferred phx. Pt is aware and verbalized thanks.

## 2021-10-22 ENCOUNTER — Other Ambulatory Visit: Payer: Self-pay | Admitting: *Deleted

## 2021-10-22 MED ORDER — ALBUTEROL SULFATE HFA 108 (90 BASE) MCG/ACT IN AERS: 2.0000 | INHALATION_SPRAY | Freq: Four times a day (QID) | RESPIRATORY_TRACT | 1 refills | Status: AC | PRN

## 2021-10-22 MED ORDER — BENZONATATE 100 MG PO CAPS: 200.0000 mg | ORAL_CAPSULE | Freq: Three times a day (TID) | ORAL | 0 refills | Status: DC | PRN

## 2021-10-22 MED ORDER — LIDOCAINE-PRILOCAINE 2.5-2.5 % EX CREA: 1.0000 "application " | TOPICAL_CREAM | CUTANEOUS | 0 refills | Status: DC | PRN

## 2021-10-22 NOTE — Progress Notes (Signed)
Received call from pt with complaint of lingering dry cough post Covid.  Pt denies fever or any other Covid symptoms. Pt requesting refill on albuterol inhaler.  Per MD okay to refill inhaler and to also send in a prescription for Tessalon Pearls 200 mg p.o TID.  Pt educated and verbalized understanding.

## 2021-10-24 ENCOUNTER — Encounter: Payer: Self-pay | Admitting: Hematology and Oncology

## 2021-10-24 NOTE — Progress Notes (Signed)
The following biosimilar Kanjinti (trastuzumab-anns) has been selected for use in this patient.  Kennith Center, Pharm.D., CPP 10/24/2021@3 :25 PM

## 2021-10-25 ENCOUNTER — Inpatient Hospital Stay: Payer: Managed Care, Other (non HMO)

## 2021-10-25 ENCOUNTER — Other Ambulatory Visit: Payer: Self-pay

## 2021-10-25 ENCOUNTER — Inpatient Hospital Stay: Payer: Managed Care, Other (non HMO) | Attending: Hematology and Oncology

## 2021-10-25 VITALS — BP 136/96 | HR 78 | Temp 98.1°F | Resp 18 | Wt 185.2 lb

## 2021-10-25 DIAGNOSIS — Z79899 Other long term (current) drug therapy: Secondary | ICD-10-CM | POA: Diagnosis not present

## 2021-10-25 DIAGNOSIS — C50412 Malignant neoplasm of upper-outer quadrant of left female breast: Secondary | ICD-10-CM

## 2021-10-25 DIAGNOSIS — Z171 Estrogen receptor negative status [ER-]: Secondary | ICD-10-CM

## 2021-10-25 DIAGNOSIS — Z5112 Encounter for antineoplastic immunotherapy: Secondary | ICD-10-CM | POA: Insufficient documentation

## 2021-10-25 DIAGNOSIS — Z95828 Presence of other vascular implants and grafts: Secondary | ICD-10-CM

## 2021-10-25 LAB — CBC WITH DIFFERENTIAL (CANCER CENTER ONLY)
Abs Immature Granulocytes: 0.01 10*3/uL (ref 0.00–0.07)
Basophils Absolute: 0 10*3/uL (ref 0.0–0.1)
Basophils Relative: 0 %
Eosinophils Absolute: 0.1 10*3/uL (ref 0.0–0.5)
Eosinophils Relative: 2 %
HCT: 35.4 % — ABNORMAL LOW (ref 36.0–46.0)
Hemoglobin: 11.3 g/dL — ABNORMAL LOW (ref 12.0–15.0)
Immature Granulocytes: 0 %
Lymphocytes Relative: 24 %
Lymphs Abs: 1.1 10*3/uL (ref 0.7–4.0)
MCH: 27.8 pg (ref 26.0–34.0)
MCHC: 31.9 g/dL (ref 30.0–36.0)
MCV: 87.2 fL (ref 80.0–100.0)
Monocytes Absolute: 0.3 10*3/uL (ref 0.1–1.0)
Monocytes Relative: 6 %
Neutro Abs: 3.1 10*3/uL (ref 1.7–7.7)
Neutrophils Relative %: 68 %
Platelet Count: 315 10*3/uL (ref 150–400)
RBC: 4.06 MIL/uL (ref 3.87–5.11)
RDW: 15.5 % (ref 11.5–15.5)
WBC Count: 4.6 10*3/uL (ref 4.0–10.5)
nRBC: 0 % (ref 0.0–0.2)

## 2021-10-25 LAB — CMP (CANCER CENTER ONLY)
ALT: 15 U/L (ref 0–44)
AST: 14 U/L — ABNORMAL LOW (ref 15–41)
Albumin: 4.2 g/dL (ref 3.5–5.0)
Alkaline Phosphatase: 64 U/L (ref 38–126)
Anion gap: 8 (ref 5–15)
BUN: 15 mg/dL (ref 6–20)
CO2: 27 mmol/L (ref 22–32)
Calcium: 9.5 mg/dL (ref 8.9–10.3)
Chloride: 106 mmol/L (ref 98–111)
Creatinine: 0.84 mg/dL (ref 0.44–1.00)
GFR, Estimated: >60 mL/min (ref >60)
Glucose, Bld: 167 mg/dL — ABNORMAL HIGH (ref 70–99)
Potassium: 3.9 mmol/L (ref 3.5–5.1)
Sodium: 141 mmol/L (ref 135–145)
Total Bilirubin: 0.3 mg/dL (ref 0.3–1.2)
Total Protein: 7 g/dL (ref 6.5–8.1)

## 2021-10-25 MED ORDER — SODIUM CHLORIDE 0.9 % IV SOLN
420.0000 mg | Freq: Once | INTRAVENOUS | Status: AC
  Administered 2021-10-25: 420 mg via INTRAVENOUS
  Filled 2021-10-25: qty 14

## 2021-10-25 MED ORDER — ACETAMINOPHEN 325 MG PO TABS
650.0000 mg | ORAL_TABLET | Freq: Once | ORAL | Status: AC
  Administered 2021-10-25: 650 mg via ORAL
  Filled 2021-10-25: qty 2

## 2021-10-25 MED ORDER — DIPHENHYDRAMINE HCL 25 MG PO CAPS
25.0000 mg | ORAL_CAPSULE | Freq: Once | ORAL | Status: AC
  Administered 2021-10-25: 25 mg via ORAL
  Filled 2021-10-25: qty 1

## 2021-10-25 MED ORDER — SODIUM CHLORIDE 0.9 % IV SOLN: Freq: Once | INTRAVENOUS | Status: AC

## 2021-10-25 MED ORDER — SODIUM CHLORIDE 0.9% FLUSH
10.0000 mL | Freq: Once | INTRAVENOUS | Status: AC
  Administered 2021-10-25: 10 mL

## 2021-10-25 MED ORDER — TRASTUZUMAB-ANNS CHEMO 150 MG IV SOLR
6.0000 mg/kg | Freq: Once | INTRAVENOUS | Status: AC
  Administered 2021-10-25: 462 mg via INTRAVENOUS
  Filled 2021-10-25: qty 22

## 2021-10-25 MED ORDER — SODIUM CHLORIDE 0.9% FLUSH
10.0000 mL | INTRAVENOUS | Status: DC | PRN
  Administered 2021-10-25: 10 mL

## 2021-10-25 MED ORDER — HEPARIN SOD (PORK) LOCK FLUSH 100 UNIT/ML IV SOLN
500.0000 [IU] | Freq: Once | INTRAVENOUS | Status: AC | PRN
  Administered 2021-10-25: 500 [IU]

## 2021-10-25 NOTE — Patient Instructions (Signed)
[place] ONCOLOGY  Discharge Instructions: Thank you for choosing [facility] to provide your oncology and hematology care.   If you have a lab appointment with the [facility], please go directly to the [facility] and check in at the registration area.   Wear comfortable clothing and clothing appropriate for easy access to any Portacath or PICC line.   We strive to give you quality time with your provider. You may need to reschedule your appointment if you arrive late (15 or more minutes).  Arriving late affects you and other patients whose appointments are after yours.  Also, if you miss three or more appointments without notifying the office, you may be dismissed from the clinic at the providers discretion.      For prescription refill requests, have your pharmacy contact our office and allow 72 hours for refills to be completed.    Today you received the following chemotherapy and/or immunotherapy agents Herceptin and Perjeta      To help prevent nausea and vomiting after your treatment, we encourage you to take your nausea medication as directed.  BELOW ARE SYMPTOMS THAT SHOULD BE REPORTED IMMEDIATELY: *FEVER GREATER THAN 100.4 F (38 C) OR HIGHER *CHILLS OR SWEATING *NAUSEA AND VOMITING THAT IS NOT CONTROLLED WITH YOUR NAUSEA MEDICATION *UNUSUAL SHORTNESS OF BREATH *UNUSUAL BRUISING OR BLEEDING *URINARY PROBLEMS (pain or burning when urinating, or frequent urination) *BOWEL PROBLEMS (unusual diarrhea, constipation, pain near the anus) TENDERNESS IN MOUTH AND THROAT WITH OR WITHOUT PRESENCE OF ULCERS (sore throat, sores in mouth, or a toothache) UNUSUAL RASH, SWELLING OR PAIN  UNUSUAL VAGINAL DISCHARGE OR ITCHING   Items with * indicate a potential emergency and should be followed up as soon as possible or go to the Emergency Department if any problems should occur.  Please show the CHEMOTHERAPY ALERT CARD or IMMUNOTHERAPY ALERT CARD at  check-in to the Emergency Department and triage nurse.  Should you have questions after your visit or need to cancel or reschedule your appointment, please contact [facility]  Dept: [phone]  and follow the prompts.  Office hours are 8:00 a.m. to 4:30 p.m. Monday - Friday. Please note that voicemails left after 4:00 p.m. may not be returned until the following business day.  We are closed weekends and major holidays. You have access to a nurse at all times for urgent questions. Please call the main number to the clinic Dept: [phone] and follow the prompts.   For any non-urgent questions, you may also contact your provider using MyChart. We now offer e-Visits for anyone 16 and older to request care online for non-urgent symptoms. For details visit [email or web address].   Also download the MyChart app! Go to the app store, search "MyChart", open the app, select [facility], and log in with your MyChart username and password.  Due to Covid, a mask is required upon entering the hospital/clinic. If you do not have a mask, one will be given to you upon arrival. For doctor visits, patients may have 1 support person aged 66 or older with them. For treatment visits, patients cannot have anyone with them due to current Covid guidelines and our immunocompromised population.

## 2021-10-25 NOTE — Progress Notes (Signed)
Pt declined to stay for her 30 min observation post infusion. VSS and pt discharged to lobby in stable condition without further questions or complaints.

## 2021-11-14 NOTE — Progress Notes (Signed)
Patient Care Team: London Pepper, MD as PCP - General (Family Medicine) Mauro Kaufmann, RN as Oncology Nurse Navigator Rockwell Germany, RN as Oncology Nurse Navigator Rolm Bookbinder, MD as Consulting Physician (General Surgery) Nicholas Lose, MD as Consulting Physician (Hematology and Oncology) Eppie Gibson, MD as Attending Physician (Radiation Oncology)  DIAGNOSIS:    ICD-10-CM   1. Malignant neoplasm of upper-outer quadrant of left breast in female, estrogen receptor negative ([place])  C50.412    Z17.1       SUMMARY OF ONCOLOGIC HISTORY: Oncology History  Malignant neoplasm of upper-outer quadrant of left breast in female, estrogen receptor negative ([place])  12/19/2020 Initial Diagnosis   Patient palpated a left breast mass and noted left axilla tenderness for one week. Diagnostic mammogram and US showed a conglomeration of masses in the left breast at the 2 o'clock position 3-4cm from the nipple spanning 3.9cm total, with a 0.7cm mass at the 2 o'clock position 6cm from the nipple, and two abnormal axillary lymph nodes. 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   12/26/2020 Cancer Staging   Staging form: Breast, AJCC 8th Edition - Clinical stage from 12/26/2020: Stage IIIA (cT3, cN1, cM0, G3, ER-, PR-, HER2+) - Signed by Nicholas Lose, MD on 12/26/2020 Stage prefix: Initial diagnosis Histologic grading system: 3 grade system    01/08/2021 - 05/14/2021 Chemotherapy   TCHP x6 cycles followed by Herceptin Perjeta maintenance        01/18/2021 Genetic Testing   Negative genetic testing:  No pathogenic variants detected on the [employer] CancerNext-Expanded + RNAinsight panel. The report date is 01/18/2021.   The CancerNext-Expanded + RNAinsight gene panel offered by [employer] and includes sequencing and rearrangement analysis for the following 77 genes: AIP, ALK, APC, ATM, AXIN2, BAP1, BARD1, BLM, BMPR1A, BRCA1, BRCA2,  BRIP1, CDC73, CDH1, CDK4, CDKN1B, CDKN2A, CHEK2, CTNNA1, DICER1, FANCC, FH, FLCN, GALNT12, KIF1B, LZTR1, MAX, MEN1, MET, MLH1, MSH2, MSH3, MSH6, MUTYH, NBN, NF1, NF2, NTHL1, PALB2, PHOX2B, PMS2, POT1, PRKAR1A, PTCH1, PTEN, RAD51C, RAD51D, RB1, RECQL, RET, SDHA, SDHAF2, SDHB, SDHC, SDHD, SMAD4, SMARCA4, SMARCB1, SMARCE1, STK11, SUFU, TMEM127, TP53, TSC1, TSC2, VHL and XRCC2 (sequencing and deletion/duplication); EGFR, EGLN1, HOXB13, KIT, MITF, PDGFRA, POLD1 and POLE (sequencing only); EPCAM and GREM1 (deletion/duplication only). RNA data is routinely analyzed for use in variant interpretation for all genes.   06/22/2021 -  Chemotherapy   Patient is on Treatment Plan : BREAST Trastuzumab + Pertuzumab q21d     07/16/2021 - 08/22/2021 Radiation Therapy   Adjuvant radiation     CHIEF COMPLIANT: Cycle 8 Herceptin and Perjeta  INTERVAL HISTORY: Maria Kennedy is a 51 y.o. with above-mentioned history of breast cancer currently on neoadjuvant chemotherapy with Herceptin and Perjeta. She presents to the clinic today for follow-up and toxicity check.  She has recovered very well from [place] infection that she had in January.  She had no longer has any cough or any further issues.  She is tolerating Herceptin and Perjeta extremely well.  Energy levels are improving.  ALLERGIES:  is allergic to latex.  MEDICATIONS:  Current Outpatient Medications  Medication Sig Dispense Refill   acetaminophen (TYLENOL) 650 MG CR tablet Take 650 mg by mouth every 8 (eight) hours as needed for pain.     albuterol (VENTOLIN HFA) 108 (90 Base) MCG/ACT inhaler Inhale 2 puffs into the lungs every 6 (six) hours as needed for wheezing or shortness of breath. 1 each 1  amphetamine-dextroamphetamine (ADDERALL XR) 20 MG 24 hr capsule Take 20 mg by mouth daily as needed.     benzonatate (TESSALON) 100 MG capsule Take 2 capsules (200 mg total) by mouth 3 (three) times daily as needed for cough. 20 capsule 0   ergocalciferol (VITAMIN  D2) 1.25 MG (50000 UT) capsule Take 1 capsule (50,000 Units total) by mouth once a week. 12 capsule 3   lidocaine-prilocaine (EMLA) cream Apply 1 application topically as needed. 30 g 0   LORazepam (ATIVAN) 0.5 MG tablet Take 1 tablet (0.5 mg total) by mouth at bedtime. 30 tablet 3   Menaquinone-7 (VITAMIN K2) 40 MCG TABS Take 2 tablets by mouth daily. 80 mcg daily total     methylPREDNISolone (MEDROL DOSEPAK) 4 MG TBPK tablet As directed on Box 21 tablet 0   VITAMIN D PO Take 5,000 Int'l Units by mouth every other day.     No current facility-administered medications for this visit.    PHYSICAL EXAMINATION: ECOG PERFORMANCE STATUS: 1 - Symptomatic but completely ambulatory  Vitals:   11/15/21 0917  BP: (!) 130/100  Pulse: 78  Resp: 16  Temp: 97.7 F (36.5 C)  SpO2: 98%   Filed Weights   11/15/21 0917  Weight: 187 lb 1.6 oz (84.9 kg)    BREAST: No palpable masses or nodules in either right or left breasts. No palpable axillary supraclavicular or infraclavicular adenopathy no breast tenderness or nipple discharge. (exam performed in the presence of a chaperone)  LABORATORY DATA:  I have reviewed the data as listed CMP Latest Ref Rng & Units 10/25/2021 10/04/2021 08/02/2021  Glucose 70 - 99 mg/dL 167(H) 122(H) 109(H)  BUN 6 - 20 mg/dL _0 Creatinine 0.44 - 1.00 mg/dL 0.84 0.87 0.65  Sodium 135 - 145 mmol/L 141 141 141  Potassium 3.5 - 5.1 mmol/L 3.9 3.9 4.0  Chloride 98 - 111 mmol/L 106 107 108  CO2 22 - 32 mmol/L _1 Calcium 8.9 - 10.3 mg/dL 9.5 9.4 9.5  Total Protein 6.5 - 8.1 g/dL 7.0 7.1 7.3  Total Bilirubin 0.3 - 1.2 mg/dL 0.3 0.3 0.5  Alkaline Phos 38 - 126 U/L 64 56 49  AST 15 - 41 U/L 14(L) 14(L) 18  ALT 0 - 44 U/L _2 Lab Results  Component Value Date   WBC 4.8 11/15/2021   HGB 11.3 (L) 11/15/2021   HCT 34.9 (L) 11/15/2021   MCV 87.3 11/15/2021   PLT 296 11/15/2021   NEUTROABS 3.3 11/15/2021    ASSESSMENT & PLAN:  Malignant neoplasm  of upper-outer quadrant of left breast in female, estrogen receptor negative ([place]) 12/19/2020: Palpable left breast mass and left axillary tenderness.  Mammogram revealed conglomeration of masses 3.9 cm and an adjacent 0.7 cm mass and 2 axillary lymph nodes that were abnormal. Biopsy revealed IDC with DCIS, grade 3, ER/PR negative, HER-2 +3+ by [facility]   12/24/2020: Breast MRI revealed mass or non-mass enhancement measuring 9.5 cm and 3 abnormal lymph nodes 01-02-21: Bone scan: No bone mets 01/01/2021: CT CAP: No distant metastatic disease   Treatment Plan: 1. Neoadjuvant chemotherapy with TCH Perjeta 6 cycles followed by Herceptin Perjeta maintenance  2. left mastectomy 06/04/2021: Dr. Donne Hazel: Complete pathologic response, no residual cancer, 0/1 lymph node negative 3. Followed by adjuvant radiation therapy completed 08/22/2021 -------------------------------------------------------------------------------------------------------------------------------- Current treatment: Herceptin Perjeta maintenance Toxicities: None Send a prescription for vitamin D 50,000 units COVID infection January 2023: Recovered from Return to clinic  every 3 weeks for Herceptin and Perjeta and every 6 weeks with follow-up with me.   No orders of the defined types were placed in this encounter.  The patient has a good understanding of the overall plan. she agrees with it. she will call with any problems that may develop before the next visit here.  Total time spent: 30 mins including face to face time and time spent for planning, charting and coordination of care  Rulon Eisenmenger, MD, MPH 11/15/2021  I, Thana Ates, am acting as scribe for Dr. Nicholas Lose.  I have reviewed the above documentation for accuracy and completeness, and I agree with the above.

## 2021-11-14 NOTE — Assessment & Plan Note (Signed)
12/19/2020: Palpable left breast mass and left axillary tenderness. Mammogram revealed conglomeration of masses 3.9 cm and an adjacent 0.7 cm mass and 2 axillary lymph nodes that were abnormal. Biopsy revealed IDC with DCIS, grade 3, ER/PR negative, HER-2 +3+ by [facility]  12/24/2020: Breast MRI revealed mass or non-mass enhancement measuring 9.5 cm and 3 abnormal lymph nodes 01-02-21: Bone scan: No bone mets 01/01/2021: CT CAP: No distant metastatic disease  Treatment Plan: 1. Neoadjuvant chemotherapy with TCH Perjeta 6 cycles followed by Herceptin Perjeta maintenance  2.left mastectomy 06/04/2021: Dr. Donne Hazel: Complete pathologic response, no residual cancer, 0/1 lymph node negative 3. Followed by adjuvant radiation therapycompleted 08/22/2021 -------------------------------------------------------------------------------------------------------------------------------- Current treatment: Herceptin Perjeta maintenance Toxicities: None Send a prescription for vitamin D 50,000 units Radiation related cough: Sent the prescription for Medrol Dosepak Return to clinic every 3 weeks for Herceptin and Perjeta and every 6 weeks with follow-up with me.

## 2021-11-15 ENCOUNTER — Other Ambulatory Visit: Payer: Self-pay

## 2021-11-15 ENCOUNTER — Inpatient Hospital Stay (HOSPITAL_BASED_OUTPATIENT_CLINIC_OR_DEPARTMENT_OTHER): Payer: Managed Care, Other (non HMO) | Admitting: Hematology and Oncology

## 2021-11-15 ENCOUNTER — Inpatient Hospital Stay: Payer: Managed Care, Other (non HMO) | Attending: Hematology and Oncology

## 2021-11-15 ENCOUNTER — Other Ambulatory Visit: Payer: Self-pay | Admitting: *Deleted

## 2021-11-15 ENCOUNTER — Inpatient Hospital Stay: Payer: Managed Care, Other (non HMO)

## 2021-11-15 VITALS — BP 130/88 | HR 81 | Temp 98.0°F | Resp 16

## 2021-11-15 DIAGNOSIS — Z5111 Encounter for antineoplastic chemotherapy: Secondary | ICD-10-CM | POA: Diagnosis not present

## 2021-11-15 DIAGNOSIS — Z79899 Other long term (current) drug therapy: Secondary | ICD-10-CM | POA: Diagnosis not present

## 2021-11-15 DIAGNOSIS — C50412 Malignant neoplasm of upper-outer quadrant of left female breast: Secondary | ICD-10-CM

## 2021-11-15 DIAGNOSIS — Z171 Estrogen receptor negative status [ER-]: Secondary | ICD-10-CM

## 2021-11-15 DIAGNOSIS — Z5181 Encounter for therapeutic drug level monitoring: Secondary | ICD-10-CM

## 2021-11-15 DIAGNOSIS — Z5112 Encounter for antineoplastic immunotherapy: Secondary | ICD-10-CM | POA: Diagnosis present

## 2021-11-15 DIAGNOSIS — Z95828 Presence of other vascular implants and grafts: Secondary | ICD-10-CM

## 2021-11-15 LAB — CMP (CANCER CENTER ONLY)
ALT: 13 U/L (ref 0–44)
AST: 13 U/L — ABNORMAL LOW (ref 15–41)
Albumin: 4.3 g/dL (ref 3.5–5.0)
Alkaline Phosphatase: 59 U/L (ref 38–126)
Anion gap: 6 (ref 5–15)
BUN: 16 mg/dL (ref 6–20)
CO2: 27 mmol/L (ref 22–32)
Calcium: 9.3 mg/dL (ref 8.9–10.3)
Chloride: 107 mmol/L (ref 98–111)
Creatinine: 0.8 mg/dL (ref 0.44–1.00)
GFR, Estimated: >60 mL/min (ref >60)
Glucose, Bld: 124 mg/dL — ABNORMAL HIGH (ref 70–99)
Potassium: 4 mmol/L (ref 3.5–5.1)
Sodium: 140 mmol/L (ref 135–145)
Total Bilirubin: 0.2 mg/dL — ABNORMAL LOW (ref 0.3–1.2)
Total Protein: 7 g/dL (ref 6.5–8.1)

## 2021-11-15 LAB — CBC WITH DIFFERENTIAL (CANCER CENTER ONLY)
Abs Immature Granulocytes: 0.01 10*3/uL (ref 0.00–0.07)
Basophils Absolute: 0 10*3/uL (ref 0.0–0.1)
Basophils Relative: 1 %
Eosinophils Absolute: 0.1 10*3/uL (ref 0.0–0.5)
Eosinophils Relative: 2 %
HCT: 34.9 % — ABNORMAL LOW (ref 36.0–46.0)
Hemoglobin: 11.3 g/dL — ABNORMAL LOW (ref 12.0–15.0)
Immature Granulocytes: 0 %
Lymphocytes Relative: 24 %
Lymphs Abs: 1.1 10*3/uL (ref 0.7–4.0)
MCH: 28.3 pg (ref 26.0–34.0)
MCHC: 32.4 g/dL (ref 30.0–36.0)
MCV: 87.3 fL (ref 80.0–100.0)
Monocytes Absolute: 0.3 10*3/uL (ref 0.1–1.0)
Monocytes Relative: 6 %
Neutro Abs: 3.3 10*3/uL (ref 1.7–7.7)
Neutrophils Relative %: 67 %
Platelet Count: 296 10*3/uL (ref 150–400)
RBC: 4 MIL/uL (ref 3.87–5.11)
RDW: 14.5 % (ref 11.5–15.5)
WBC Count: 4.8 10*3/uL (ref 4.0–10.5)
nRBC: 0 % (ref 0.0–0.2)

## 2021-11-15 MED ORDER — DIPHENHYDRAMINE HCL 25 MG PO CAPS
25.0000 mg | ORAL_CAPSULE | Freq: Once | ORAL | Status: AC
  Administered 2021-11-15: 25 mg via ORAL
  Filled 2021-11-15: qty 1

## 2021-11-15 MED ORDER — SODIUM CHLORIDE 0.9 % IV SOLN
420.0000 mg | Freq: Once | INTRAVENOUS | Status: AC
  Administered 2021-11-15: 420 mg via INTRAVENOUS
  Filled 2021-11-15: qty 14

## 2021-11-15 MED ORDER — SODIUM CHLORIDE 0.9 % IV SOLN: Freq: Once | INTRAVENOUS | Status: AC

## 2021-11-15 MED ORDER — ACETAMINOPHEN 325 MG PO TABS
650.0000 mg | ORAL_TABLET | Freq: Once | ORAL | Status: AC
  Administered 2021-11-15: 650 mg via ORAL
  Filled 2021-11-15: qty 2

## 2021-11-15 MED ORDER — SODIUM CHLORIDE 0.9% FLUSH
10.0000 mL | INTRAVENOUS | Status: DC | PRN
  Administered 2021-11-15: 10 mL

## 2021-11-15 MED ORDER — HEPARIN SOD (PORK) LOCK FLUSH 100 UNIT/ML IV SOLN
500.0000 [IU] | Freq: Once | INTRAVENOUS | Status: AC | PRN
  Administered 2021-11-15: 500 [IU]

## 2021-11-15 MED ORDER — SODIUM CHLORIDE 0.9% FLUSH
10.0000 mL | Freq: Once | INTRAVENOUS | Status: AC
  Administered 2021-11-15: 10 mL

## 2021-11-15 MED ORDER — TRASTUZUMAB-ANNS CHEMO 150 MG IV SOLR
6.0000 mg/kg | Freq: Once | INTRAVENOUS | Status: AC
  Administered 2021-11-15: 462 mg via INTRAVENOUS
  Filled 2021-11-15: qty 22

## 2021-11-15 NOTE — Progress Notes (Signed)
Per Dr. Lindi Adie OK to trt today w/ ECHO from 08/09/21 EF 60-65% and orders have been placed for a new a ECHO

## 2021-11-15 NOTE — Patient Instructions (Signed)
ECHO Procedure is scheduled for Thursday 11/21/21 at 10 am if that does not work please call [phone] to reschedule.  [facility]  Discharge Instructions: Thank you for choosing [facility] to provide your oncology and hematology care.   If you have a lab appointment with the [facility], please go directly to the [facility] and check in at the registration area.   Wear comfortable clothing and clothing appropriate for easy access to any Portacath or PICC line.   We strive to give you quality time with your provider. You may need to reschedule your appointment if you arrive late (15 or more minutes).  Arriving late affects you and other patients whose appointments are after yours.  Also, if you miss three or more appointments without notifying the office, you may be dismissed from the clinic at the providers discretion.      For prescription refill requests, have your pharmacy contact our office and allow 72 hours for refills to be completed.    Today you received the following chemotherapy and/or immunotherapy agents: Kanjinti/Perjeta   To help prevent nausea and vomiting after your treatment, we encourage you to take your nausea medication as directed.  BELOW ARE SYMPTOMS THAT SHOULD BE REPORTED IMMEDIATELY: *FEVER GREATER THAN 100.4 F (38 C) OR HIGHER *CHILLS OR SWEATING *NAUSEA AND VOMITING THAT IS NOT CONTROLLED WITH YOUR NAUSEA MEDICATION *UNUSUAL SHORTNESS OF BREATH *UNUSUAL BRUISING OR BLEEDING *URINARY PROBLEMS (pain or burning when urinating, or frequent urination) *BOWEL PROBLEMS (unusual diarrhea, constipation, pain near the anus) TENDERNESS IN MOUTH AND THROAT WITH OR WITHOUT PRESENCE OF ULCERS (sore throat, sores in mouth, or a toothache) UNUSUAL RASH, SWELLING OR PAIN  UNUSUAL VAGINAL DISCHARGE OR ITCHING   Items with * indicate a potential emergency and should be followed up as soon as possible or go to the Emergency  Department if any problems should occur.  Please show the CHEMOTHERAPY ALERT CARD or IMMUNOTHERAPY ALERT CARD at check-in to the Emergency Department and triage nurse.  Should you have questions after your visit or need to cancel or reschedule your appointment, please contact [facility]  Dept: [phone]  and follow the prompts.  Office hours are 8:00 a.m. to 4:30 p.m. Monday - Friday. Please note that voicemails left after 4:00 p.m. may not be returned until the following business day.  We are closed weekends and major holidays. You have access to a nurse at all times for urgent questions. Please call the main number to the clinic Dept: [phone] and follow the prompts.   For any non-urgent questions, you may also contact your provider using MyChart. We now offer e-Visits for anyone 65 and older to request care online for non-urgent symptoms. For details visit [email or web address].   Also download the MyChart app! Go to the app store, search "MyChart", open the app, select [facility], and log in with your MyChart username and password.  Due to Covid, a mask is required upon entering the hospital/clinic. If you do not have a mask, one will be given to you upon arrival. For doctor visits, patients may have 1 support person aged 18 or older with them. For treatment visits, patients cannot have anyone with them due to current Covid guidelines and our immunocompromised population.

## 2021-11-21 ENCOUNTER — Ambulatory Visit (HOSPITAL_COMMUNITY)
Admission: RE | Admit: 2021-11-21 | Discharge: 2021-11-21 | Disposition: A | Discharge status: Home / Self Care | Payer: Managed Care, Other (non HMO) | Source: Ambulatory Visit | Attending: Hematology and Oncology | Admitting: Hematology and Oncology

## 2021-11-21 ENCOUNTER — Other Ambulatory Visit: Payer: Self-pay

## 2021-11-21 DIAGNOSIS — C50919 Malignant neoplasm of unspecified site of unspecified female breast: Secondary | ICD-10-CM | POA: Insufficient documentation

## 2021-11-21 DIAGNOSIS — Z5181 Encounter for therapeutic drug level monitoring: Secondary | ICD-10-CM | POA: Diagnosis not present

## 2021-11-21 DIAGNOSIS — Z0189 Encounter for other specified special examinations: Secondary | ICD-10-CM | POA: Diagnosis not present

## 2021-11-21 DIAGNOSIS — Z79899 Other long term (current) drug therapy: Secondary | ICD-10-CM | POA: Diagnosis not present

## 2021-11-21 DIAGNOSIS — I517 Cardiomegaly: Secondary | ICD-10-CM

## 2021-11-21 LAB — ECHOCARDIOGRAM COMPLETE
Area-P 1/2: 4.11 cm2
S' Lateral: 2.4 cm
Single Plane A4C EF: 70.5 %

## 2021-11-21 NOTE — Progress Notes (Signed)
°  Echocardiogram 2D Echocardiogram has been performed.  Bobbye Charleston 11/21/2021, 10:51 AM

## 2021-12-05 ENCOUNTER — Encounter: Payer: Self-pay | Admitting: *Deleted

## 2021-12-06 ENCOUNTER — Inpatient Hospital Stay: Payer: Managed Care, Other (non HMO)

## 2021-12-06 ENCOUNTER — Other Ambulatory Visit: Payer: Self-pay | Admitting: *Deleted

## 2021-12-06 ENCOUNTER — Other Ambulatory Visit: Payer: Self-pay

## 2021-12-06 VITALS — BP 130/102 | HR 71 | Temp 98.0°F | Resp 16 | Wt 183.0 lb

## 2021-12-06 DIAGNOSIS — Z5111 Encounter for antineoplastic chemotherapy: Secondary | ICD-10-CM | POA: Diagnosis not present

## 2021-12-06 DIAGNOSIS — C50412 Malignant neoplasm of upper-outer quadrant of left female breast: Secondary | ICD-10-CM

## 2021-12-06 DIAGNOSIS — Z171 Estrogen receptor negative status [ER-]: Secondary | ICD-10-CM

## 2021-12-06 DIAGNOSIS — Z95828 Presence of other vascular implants and grafts: Secondary | ICD-10-CM

## 2021-12-06 LAB — CBC WITH DIFFERENTIAL (CANCER CENTER ONLY)
Abs Immature Granulocytes: 0.01 10*3/uL (ref 0.00–0.07)
Basophils Absolute: 0 10*3/uL (ref 0.0–0.1)
Basophils Relative: 0 %
Eosinophils Absolute: 0 10*3/uL (ref 0.0–0.5)
Eosinophils Relative: 1 %
HCT: 37.3 % (ref 36.0–46.0)
Hemoglobin: 11.9 g/dL — ABNORMAL LOW (ref 12.0–15.0)
Immature Granulocytes: 0 %
Lymphocytes Relative: 23 %
Lymphs Abs: 1.2 10*3/uL (ref 0.7–4.0)
MCH: 28.1 pg (ref 26.0–34.0)
MCHC: 31.9 g/dL (ref 30.0–36.0)
MCV: 88.2 fL (ref 80.0–100.0)
Monocytes Absolute: 0.3 10*3/uL (ref 0.1–1.0)
Monocytes Relative: 6 %
Neutro Abs: 3.7 10*3/uL (ref 1.7–7.7)
Neutrophils Relative %: 70 %
Platelet Count: 305 10*3/uL (ref 150–400)
RBC: 4.23 MIL/uL (ref 3.87–5.11)
RDW: 13.7 % (ref 11.5–15.5)
WBC Count: 5.2 10*3/uL (ref 4.0–10.5)
nRBC: 0 % (ref 0.0–0.2)

## 2021-12-06 LAB — CMP (CANCER CENTER ONLY)
ALT: 16 U/L (ref 0–44)
AST: 14 U/L — ABNORMAL LOW (ref 15–41)
Albumin: 4.5 g/dL (ref 3.5–5.0)
Alkaline Phosphatase: 59 U/L (ref 38–126)
Anion gap: 7 (ref 5–15)
BUN: 15 mg/dL (ref 6–20)
CO2: 27 mmol/L (ref 22–32)
Calcium: 9.4 mg/dL (ref 8.9–10.3)
Chloride: 107 mmol/L (ref 98–111)
Creatinine: 0.84 mg/dL (ref 0.44–1.00)
GFR, Estimated: >60 mL/min (ref >60)
Glucose, Bld: 116 mg/dL — ABNORMAL HIGH (ref 70–99)
Potassium: 3.9 mmol/L (ref 3.5–5.1)
Sodium: 141 mmol/L (ref 135–145)
Total Bilirubin: 0.3 mg/dL (ref 0.3–1.2)
Total Protein: 7.2 g/dL (ref 6.5–8.1)

## 2021-12-06 MED ORDER — SODIUM CHLORIDE 0.9 % IV SOLN
420.0000 mg | Freq: Once | INTRAVENOUS | Status: AC
  Administered 2021-12-06: 420 mg via INTRAVENOUS
  Filled 2021-12-06: qty 14

## 2021-12-06 MED ORDER — SODIUM CHLORIDE 0.9% FLUSH
10.0000 mL | Freq: Once | INTRAVENOUS | Status: AC
  Administered 2021-12-06: 10 mL

## 2021-12-06 MED ORDER — SODIUM CHLORIDE 0.9 % IV SOLN: Freq: Once | INTRAVENOUS | Status: AC

## 2021-12-06 MED ORDER — SODIUM CHLORIDE 0.9% FLUSH
10.0000 mL | INTRAVENOUS | Status: DC | PRN
  Administered 2021-12-06: 10 mL

## 2021-12-06 MED ORDER — DIPHENHYDRAMINE HCL 25 MG PO CAPS
25.0000 mg | ORAL_CAPSULE | Freq: Once | ORAL | Status: AC
  Administered 2021-12-06: 25 mg via ORAL
  Filled 2021-12-06: qty 1

## 2021-12-06 MED ORDER — ACETAMINOPHEN 325 MG PO TABS
650.0000 mg | ORAL_TABLET | Freq: Once | ORAL | Status: AC
  Administered 2021-12-06: 650 mg via ORAL
  Filled 2021-12-06: qty 2

## 2021-12-06 MED ORDER — HEPARIN SOD (PORK) LOCK FLUSH 100 UNIT/ML IV SOLN
500.0000 [IU] | Freq: Once | INTRAVENOUS | Status: AC | PRN
  Administered 2021-12-06: 500 [IU]

## 2021-12-06 MED ORDER — TRASTUZUMAB-ANNS CHEMO 150 MG IV SOLR
6.0000 mg/kg | Freq: Once | INTRAVENOUS | Status: AC
  Administered 2021-12-06: 462 mg via INTRAVENOUS
  Filled 2021-12-06: qty 22

## 2021-12-06 NOTE — Progress Notes (Signed)
Patient declined 30 minute post observation period.

## 2021-12-06 NOTE — Progress Notes (Signed)
Pt presents in infusion today with complaint of severe left arm/hand numbness and tingling, symptoms worse at night when sleeping.  Pt denies redness or warmth to the touch.  Per MD based on pt hx of left mastectomy and current symptoms pt needed to be seen by [facility] for evaluation and tx of lymphedema.  Orders placed and pt verbalized understanding.

## 2021-12-09 ENCOUNTER — Ambulatory Visit: Payer: Managed Care, Other (non HMO) | Attending: Plastic Surgery

## 2021-12-09 ENCOUNTER — Other Ambulatory Visit: Payer: Self-pay

## 2021-12-09 VITALS — Wt 180.5 lb

## 2021-12-09 DIAGNOSIS — Z483 Aftercare following surgery for neoplasm: Secondary | ICD-10-CM | POA: Insufficient documentation

## 2021-12-09 NOTE — Therapy (Signed)
[facility] @ [place], [name], [address] Phone: [phone]   Fax:  [phone]  Physical Therapy Treatment  Patient Details  Name: Maria Kennedy MRN: [id number] Date of Birth: [birth year] Referring Provider (PT): Dr Iran Planas   Encounter Date: 12/09/2021   PT End of Session - 12/09/21 1619     Visit Number 4   # unchanged due to screen only   PT Start Time 1616    PT Stop Time 1621    PT Time Calculation (min) 5 min    Activity Tolerance Patient tolerated treatment well    Behavior During Therapy [facility] for tasks assessed/performed             Past Medical History:  Diagnosis Date   ADHD (attention deficit hyperactivity disorder)    Asthma    Cancer ([place])    breast ca   Depression    Family history of breast cancer    Family history of leukemia    Family history of lung cancer    Family history of pancreatic cancer     Past Surgical History:  Procedure Laterality Date   BREAST RECONSTRUCTION WITH PLACEMENT OF TISSUE EXPANDER AND ALLODERM Left 06/04/2021   Procedure: BREAST RECONSTRUCTION WITH PLACEMENT OF TISSUE EXPANDER AND ALLODERM;  Surgeon: Irene Limbo, MD;  Location: [facility];  Service: Plastics;  Laterality: Left;   MASTECTOMY WITH RADIOACTIVE SEED GUIDED EXCISION AND AXILLARY SENTINEL LYMPH NODE BIOPSY Left 06/04/2021   Procedure: LEFT MASTECTOMY, LEFT AXILLARY SENTINEL LYMPH NODE BIOPSY, LEFT AXILLARY NODE SEED GUIDED EXCISION;  Surgeon: Rolm Bookbinder, MD;  Location: [facility];  Service: General;  Laterality: Left;   PORTACATH PLACEMENT Right 01/07/2021   Procedure: INSERTION PORT-A-CATH;  Surgeon: Rolm Bookbinder, MD;  Location: [facility];  Service: General;  Laterality: Right;   TUBAL LIGATION      Vitals:   12/09/21 1618  Weight: 180 lb 8 oz (81.9 kg)     Subjective Assessment - 12/09/21 1617     Subjective Pt returns for  her 3 month L-Dex screen.    Pertinent History History of left mastectomy with expander and 0/1 lymph nodes positive on 06/04/21.  Completed neoadjuvant chemotherapy and will have radiation                    L-DEX FLOWSHEETS - 12/09/21 1600       L-DEX LYMPHEDEMA SCREENING   Measurement Type Unilateral    L-DEX MEASUREMENT EXTREMITY Upper Extremity    POSITION  Standing    DOMINANT SIDE Left    At Risk Side Left    BASELINE SCORE (UNILATERAL) -5.2    L-DEX SCORE (UNILATERAL) -4.1    VALUE CHANGE (UNILAT) 1.1                                     PT Long Term Goals - 07/30/21 1607       PT LONG TERM GOAL #1   Title Patient will demonstrate she has regained full shoulder ROM and function post operatively compared to baselines.    Status Achieved      PT LONG TERM GOAL #2   Title Pt will be ind with final HEP for use during and after radiation    Status Achieved      PT LONG TERM GOAL #3   Title Pt will  be educated on risk reduction, lymphedema risk, and use of SOZO    Status Achieved                   Plan - 12/09/21 1619     Clinical Impression Statement Pt returns forher 3 month L-Dex screen. Her change from baseline of 1.1 is WNLs so no further treatment is required at this time except to cont every 3 month L-Dex screens which pt is agreeable to.    PT Next Visit Plan Cont every 3 month L-Dex screens for up to 2 years from her SLNB (~06/05/23)    Consulted and Agree with Plan of Care Patient             Patient will benefit from skilled therapeutic intervention in order to improve the following deficits and impairments:     Visit Diagnosis: Aftercare following surgery for neoplasm     Problem List Patient Active Problem List   Diagnosis Date Noted   S/P mastectomy, left 06/04/2021   Genetic testing 01/18/2021   Port-A-Cath in place 01/15/2021   Family history of breast cancer    Family history of lung cancer     Family history of pancreatic cancer    Family history of leukemia    Malignant neoplasm of upper-outer quadrant of left breast in female, estrogen receptor negative ([place]) 12/24/2020   Benign paroxysmal positional vertigo of left ear 11/15/2018   ADD (attention deficit disorder) 08/11/2011    Otelia Limes, PTA 12/09/2021, 4:22 PM  [facility] @ [place], [name], [address] Phone: [phone]   Fax:  [phone]  Name: Madai Nuccio MRN: [id number] Date of Birth: 04/12/1971

## 2021-12-18 ENCOUNTER — Ambulatory Visit: Payer: Managed Care, Other (non HMO) | Attending: Hematology and Oncology

## 2021-12-18 ENCOUNTER — Other Ambulatory Visit: Payer: Self-pay

## 2021-12-18 DIAGNOSIS — M25612 Stiffness of left shoulder, not elsewhere classified: Secondary | ICD-10-CM | POA: Diagnosis present

## 2021-12-18 DIAGNOSIS — R200 Anesthesia of skin: Secondary | ICD-10-CM | POA: Diagnosis present

## 2021-12-18 DIAGNOSIS — Z483 Aftercare following surgery for neoplasm: Secondary | ICD-10-CM | POA: Diagnosis present

## 2021-12-18 DIAGNOSIS — C50412 Malignant neoplasm of upper-outer quadrant of left female breast: Secondary | ICD-10-CM | POA: Diagnosis present

## 2021-12-18 DIAGNOSIS — Z171 Estrogen receptor negative status [ER-]: Secondary | ICD-10-CM | POA: Insufficient documentation

## 2021-12-18 DIAGNOSIS — M6281 Muscle weakness (generalized): Secondary | ICD-10-CM | POA: Diagnosis present

## 2021-12-18 NOTE — Therapy (Signed)
OUTPATIENT PHYSICAL THERAPY ONCOLOGY EVALUATION  Patient Name: Tecora Eustache MRN: [id number] DOB:11/11/1970, 51 y.o., female Today's Date: 12/18/2021   PT End of Session - 12/18/21 2045     Visit Number 1    Number of Visits 12    Date for PT Re-Evaluation 01/29/22    PT Start Time 4270    PT Stop Time 6237    PT Time Calculation (min) 46 min             Past Medical History:  Diagnosis Date   ADHD (attention deficit hyperactivity disorder)    Asthma    Cancer ([place])    breast ca   Depression    Family history of breast cancer    Family history of leukemia    Family history of lung cancer    Family history of pancreatic cancer    Past Surgical History:  Procedure Laterality Date   BREAST RECONSTRUCTION WITH PLACEMENT OF TISSUE EXPANDER AND ALLODERM Left 06/04/2021   Procedure: BREAST RECONSTRUCTION WITH PLACEMENT OF TISSUE EXPANDER AND ALLODERM;  Surgeon: Irene Limbo, MD;  Location: [place];  Service: Plastics;  Laterality: Left;   MASTECTOMY WITH RADIOACTIVE SEED GUIDED EXCISION AND AXILLARY SENTINEL LYMPH NODE BIOPSY Left 06/04/2021   Procedure: LEFT MASTECTOMY, LEFT AXILLARY SENTINEL LYMPH NODE BIOPSY, LEFT AXILLARY NODE SEED GUIDED EXCISION;  Surgeon: Rolm Bookbinder, MD;  Location: [place];  Service: General;  Laterality: Left;   PORTACATH PLACEMENT Right 01/07/2021   Procedure: INSERTION PORT-A-CATH;  Surgeon: Rolm Bookbinder, MD;  Location: [place];  Service: General;  Laterality: Right;   TUBAL LIGATION     Patient Active Problem List   Diagnosis Date Noted   S/P mastectomy, left 06/04/2021   Genetic testing 01/18/2021   Port-A-Cath in place 01/15/2021   Family history of breast cancer    Family history of lung cancer    Family history of pancreatic cancer    Family history of leukemia    Malignant neoplasm of upper-outer quadrant of left breast in female, estrogen receptor negative ([place])  12/24/2020   Benign paroxysmal positional vertigo of left ear 11/15/2018   ADD (attention deficit disorder) 08/11/2011    PCP: London Pepper, MD  REFERRING PROVIDER: Nicholas Lose, MD  REFERRING DIAG: Left arm pain/heaviness/numbness  THERAPY DIAG:  Left arm numbness  Aftercare following surgery for neoplasm  Muscle weakness (generalized)  Stiffness of left shoulder, not elsewhere classified  ONSET DATE: Nov 20, 2021  SUBJECTIVE  SUBJECTIVE STATEMENT: Left arm started to feel heavy, restless especially at night , difficult to get comfortable to sleep.  There is soreness in the left chest area  and scapular area as well.  She felt this prior to surgery, and then it went away and now it is back.The fingers of both hands tingle and she was told it is probably from the chemo. Pt is also noticing some muscle cramping in the lateral trunk radiating to the back.Pt is left handed. Sometimes she has difficulty opening bottles. She does not notice any swelling  PERTINENT HISTORY: Pt had left mastectomy with immediate expander fon 06/04/2021. 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 She had neoadjuvant chemo followed by surgery and radiation which ended in November 2022  PAIN:  Are you having pain? No tender in left chest area   PRECAUTIONS: Other: lymphedema risk  WEIGHT BEARING RESTRICTIONS No  FALLS:  Has patient fallen in last 6 months? 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  LEISURE: some walking, reading,make balloon decor  HAND DOMINANCE : left   PRIOR LEVEL OF FUNCTION:  Independent  PATIENT GOALS Reduce left arm   OBJECTIVE  COGNITION:  Overall cognitive status: Within functional limits for tasks assessed   PALPATION: Tender left pectorals, left anterior ribs, UT, lats and scapular area  OBSERVATIONS / OTHER ASSESSMENTS: no visible edema, mild swelling at left supraclavicular region  SENSATION:  Light touch: Appears intact except posterior left arm, axillary region is numb    POSTURE: round shoulders, forward head  UPPER EXTREMITY AROM/PROM:  A/PROM RIGHT  12/18/2021   Shoulder extension 70  Shoulder flexion 167  Shoulder abduction 180  Shoulder internal rotation 70  Shoulder external rotation 112    (Blank rows = not tested)  A/PROM LEFT  12/18/2021  Shoulder extension 55  Shoulder flexion 150  Shoulder abduction 134  Shoulder internal rotation 50  Shoulder external rotation 95    (Blank rows = not tested)   CERVICAL AROM: All within functional limits:      UPPER EXTREMITY STRENGTH: Grip right 52, left 42   LLYMPHEDEMA ASSESSMENTS:   SURGERY TYPE/DATE: 06/04/2021  NUMBER OF LYMPH NODES REMOVED: 1  CHEMOTHERAPY: yes  RADIATION:yes with hyperpigmentation noted  HORMONE TREATMENT: no  INFECTIONS: no  LYMPHEDEMA ASSESSMENTS:   LANDMARK RIGHT  12/18/2021  10 cm proximal to olecranon process 31.3  Olecranon process 23.7  10 cm proximal to ulnar styloid process 20.6  Just proximal to ulnar styloid process 15.5  Across hand at thumb web space 20.2  At base of 2nd digit 6.1  (Blank rows = not tested)  LANDMARK LEFT  12/18/2021  10 cm proximal to olecranon process 31.6  Olecranon process 24  10 cm proximal to ulnar styloid process 20.7  Just proximal to ulnar styloid process 15.2  Across hand at thumb web space 20.8  At base of 2nd digit 6.3  (Blank rows = not tested)            QUICK DASH SURVEY: 41%   TODAY'S TREATMENT  Pt was educated in pec wall stretch, counter lat stretch, and diagonal counter  stretch, wall slide for shoulder abduction, and reminded about supine clasped hands for shoulder flexion to improve shoulder ROM which has become more limited  PATIENT  EDUCATION:  Education details:Pectoral wall stretch, counter and diagonal counter stretch, standing wall slide for abd, supine clasped hand stretch for shoulder flexion Person educated: Patient Education method: Consulting civil engineer, Media planner, and Handouts Education comprehension: returned demonstration   HOME EXERCISE PROGRAM: Pectoral wall/doorway stretch, counter lat stretch, abduction wall slide, clasped hand flexion x 5 2x's per day  ASSESSMENT:  CLINICAL IMPRESSION: Patient is a 51 y.o. female who was seen today for physical therapy evaluation and treatment for new onset of left shoulder stiffness, left arm heaviness and restlessness and some numbness.  She is s/p left mastectomy with implant exchange with neoadjuvant chemo, and radiation that ended in 08/2021. She presents with limitations in shoulder ROM, diffuse tenderness in the left upper quarter, decreased left hand strength,and functional limitations of 41% noted in quick dash score. She will benefit from skilled PT to address deficits and return to [place] decreased activity tolerance, decreased ROM, decreased strength, increased fascial restrictions, increased muscle spasms, impaired flexibility, impaired UE functional use, postural dysfunction, and pain.   ACTIVITY LIMITATIONS cleaning, occupation, yard work, and reaching activities .   PERSONAL FACTORS 1-2 comorbidities: breast cancer with chemo and radiation therapy  are also affecting patient's functional outcome.    REHAB POTENTIAL: Good  CLINICAL DECISION MAKING: Stable/uncomplicated  EVALUATION COMPLEXITY: Low  GOALS: Goals reviewed with patient? Yes  SHORT TERM GOALS:  Pt will be independent and compliant with HEP for Left shoulder ROM and strengthening Baseline:  Target  date: 01/08/2022 Goal status: INITIAL  2.  Pt will have L shoulder flexion atleast 160 degrees Baseline: 150 Target date: 01/29/2022 Goal status: INITIAL    LONG TERM GOALS:  Pt will have left shoulder aduction atleast 165 degrees Baseline: 130 Target date: 01/29/2022 Goal status: INITIAL  2.  Quick dash will be no greater than 15% to demonstrate improvement in function Baseline: 41 Target date: 01/29/2022 Goal status: INITIAL  3.  Pt will report pain improved/heaviness improved by 50% OR GREATER Baseline:  Target date: 01/29/2022 Goal status: INITIAL    PLAN: PT FREQUENCY: 2x/week  PT DURATION: 6 weeks  PLANNED INTERVENTIONS: Therapeutic exercises, Therapeutic activity, Neuromuscular re-education, Patient/Family education, Joint mobilization, Orthotic/Fit training, and Manual therapy  PLAN FOR NEXT SESSION: STM left upper quarter especially pectorals, UT, supine wand flexion, scaption, PROM left shoulder, progress to strength when ready, grip strength   Claris Pong, PT 12/18/2021, 8:46 PM

## 2021-12-25 ENCOUNTER — Ambulatory Visit: Payer: Managed Care, Other (non HMO)

## 2021-12-25 ENCOUNTER — Other Ambulatory Visit: Payer: Self-pay

## 2021-12-25 DIAGNOSIS — M6281 Muscle weakness (generalized): Secondary | ICD-10-CM

## 2021-12-25 DIAGNOSIS — R200 Anesthesia of skin: Secondary | ICD-10-CM

## 2021-12-25 DIAGNOSIS — M25612 Stiffness of left shoulder, not elsewhere classified: Secondary | ICD-10-CM

## 2021-12-25 DIAGNOSIS — Z483 Aftercare following surgery for neoplasm: Secondary | ICD-10-CM

## 2021-12-25 NOTE — Progress Notes (Signed)
? ?Patient Care Team: ?London Pepper, MD as PCP - General (Family Medicine) ?Mauro Kaufmann, RN as Oncology Nurse Navigator ?Rockwell Germany, RN as Oncology Nurse Navigator ?Rolm Bookbinder, MD as Consulting Physician (General Surgery) ?Nicholas Lose, MD as Consulting Physician (Hematology and Oncology) ?Eppie Gibson, MD as Attending Physician (Radiation Oncology) ? ?DIAGNOSIS:  ?Encounter Diagnosis  ?Name Primary?  ? Malignant neoplasm of upper-outer quadrant of left breast in female, estrogen receptor negative ([place])   ? ? ?SUMMARY OF ONCOLOGIC HISTORY: ?Oncology History  ?Malignant neoplasm of upper-outer quadrant of left breast in female, estrogen receptor negative ([place])  ?12/19/2020 Initial Diagnosis  ? Patient palpated a left breast mass and noted left axilla tenderness for one week. Diagnostic mammogram and US showed a conglomeration of masses in the left breast at the 2 o'clock position 3-4cm from the nipple spanning 3.9cm total, with a 0.7cm mass at the 2 o'clock position 6cm from the nipple, and two abnormal axillary lymph nodes. 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 ?  ?12/26/2020 Cancer Staging  ? Staging form: Breast, AJCC 8th Edition ?- Clinical stage from 12/26/2020: Stage IIIA (cT3, cN1, cM0, G3, ER-, PR-, HER2+) - Signed by Nicholas Lose, MD on 12/26/2020 ?Stage prefix: Initial diagnosis ?Histologic grading system: 3 grade system ? ?  ?01/08/2021 - 05/14/2021 Chemotherapy  ? TCHP x6 cycles followed by Herceptin Perjeta maintenance ? ? ?  ? ?  ?01/18/2021 Genetic Testing  ? Negative genetic testing:  No pathogenic variants detected on the [employer] CancerNext-Expanded + RNAinsight panel. The report date is 01/18/2021.  ? ?The CancerNext-Expanded + RNAinsight gene panel offered by [employer] and includes sequencing and rearrangement analysis for the following 77 genes: AIP, ALK, APC, ATM, AXIN2, BAP1, BARD1, BLM, BMPR1A, BRCA1, BRCA2,  BRIP1, CDC73, CDH1, CDK4, CDKN1B, CDKN2A, CHEK2, CTNNA1, DICER1, FANCC, FH, FLCN, GALNT12, KIF1B, LZTR1, MAX, MEN1, MET, MLH1, MSH2, MSH3, MSH6, MUTYH, NBN, NF1, NF2, NTHL1, PALB2, PHOX2B, PMS2, POT1, PRKAR1A, PTCH1, PTEN, RAD51C, RAD51D, RB1, RECQL, RET, SDHA, SDHAF2, SDHB, SDHC, SDHD, SMAD4, SMARCA4, SMARCB1, SMARCE1, STK11, SUFU, TMEM127, TP53, TSC1, TSC2, VHL and XRCC2 (sequencing and deletion/duplication); EGFR, EGLN1, HOXB13, KIT, MITF, PDGFRA, POLD1 and POLE (sequencing only); EPCAM and GREM1 (deletion/duplication only). RNA data is routinely analyzed for use in variant interpretation for all genes. ?  ?06/22/2021 -  Chemotherapy  ? Patient is on Treatment Plan : BREAST Trastuzumab + Pertuzumab q21d  ?   ?07/16/2021 - 08/22/2021 Radiation Therapy  ? Adjuvant radiation ?  ? ?CHIEF COMPLIANT: Cycle 8 Herceptin and Perjeta ? ?INTERVAL HISTORY: Maria Kennedy is a  51 y.o. with above-mentioned history of breast cancer currently on neoadjuvant chemotherapy with Herceptin and Perjeta. She presents to the clinic today for follow-up and toxicity check.  She is tolerating Herceptin and Perjeta extremely well without any problems or concerns.  She is very excited that she will be finishing with the treatment very soon.  She is very concerned about her risk of recurrent breast cancer. ? ?ALLERGIES:  is allergic to latex. ? ?MEDICATIONS:  ?Current Outpatient Medications  ?Medication Sig Dispense Refill  ? acetaminophen (TYLENOL) 650 MG CR tablet Take 650 mg by mouth every 8 (eight) hours as needed for pain.    ? albuterol (VENTOLIN HFA) 108 (90 Base) MCG/ACT inhaler Inhale 2 puffs into the lungs every 6 (six) hours as needed for wheezing or shortness of breath. 1 each 1  ? amphetamine-dextroamphetamine (ADDERALL XR) 20 MG 24  hr capsule Take 20 mg by mouth daily as needed.    ? ergocalciferol (VITAMIN D2) 1.25 MG (50000 UT) capsule Take 1 capsule (50,000 Units total) by mouth once a week. 12 capsule 3  ?  lidocaine-prilocaine (EMLA) cream Apply 1 application topically as needed. 30 g 0  ? Menaquinone-7 (VITAMIN K2) 40 MCG TABS Take 2 tablets by mouth daily. 80 mcg daily total    ? VITAMIN D PO Take 5,000 Int'l Units by mouth every other day.    ? ?No current facility-administered medications for this visit.  ? ? ?PHYSICAL EXAMINATION: ?ECOG PERFORMANCE STATUS: 1 - Symptomatic but completely ambulatory ? ?Vitals:  ? 12/27/21 0856  ?BP: (!) 126/91  ?Pulse: 96  ?Resp: 17  ?Temp: 98.2 ?F (36.8 ?C)  ?SpO2: 97%  ? ?Filed Weights  ? 12/27/21 0856  ?Weight: 184 lb 3.2 oz (83.6 kg)  ? ? ?BREAST: No palpable masses or nodules in either right or left breasts. No palpable axillary supraclavicular or infraclavicular adenopathy no breast tenderness or nipple discharge. (exam performed in the presence of a chaperone) ? ?LABORATORY DATA:  ?I have reviewed the data as listed ?CMP Latest Ref Rng & Units 12/06/2021 11/15/2021 10/25/2021  ?Glucose 70 - 99 mg/dL 116(H) 124(H) 167(H)  ?BUN 6 - 20 mg/dL _0 ?Creatinine 0.44 - 1.00 mg/dL 0.84 0.80 0.84  ?Sodium 135 - 145 mmol/L 141 140 141  ?Potassium 3.5 - 5.1 mmol/L 3.9 4.0 3.9  ?Chloride 98 - 111 mmol/L 107 107 106  ?CO2 22 - 32 mmol/L _1 ?Calcium 8.9 - 10.3 mg/dL 9.4 9.3 9.5  ?Total Protein 6.5 - 8.1 g/dL 7.2 7.0 7.0  ?Total Bilirubin 0.3 - 1.2 mg/dL 0.3 0.2(L) 0.3  ?Alkaline Phos 38 - 126 U/L 59 59 64  ?AST 15 - 41 U/L 14(L) 13(L) 14(L)  ?ALT 0 - 44 U/L _2 ? ? ?Lab Results  ?Component Value Date  ? WBC 5.2 12/27/2021  ? HGB 12.1 12/27/2021  ? HCT 37.9 12/27/2021  ? MCV 87.9 12/27/2021  ? PLT 319 12/27/2021  ? NEUTROABS 3.5 12/27/2021  ? ? ?ASSESSMENT & PLAN:  ?Malignant neoplasm of upper-outer quadrant of left breast in female, estrogen receptor negative ([place]) ?12/19/2020: Palpable left breast mass and left axillary tenderness.  Mammogram revealed conglomeration of masses 3.9 cm and an adjacent 0.7 cm mass and 2 axillary lymph nodes that were abnormal. ?Biopsy revealed  IDC with DCIS, grade 3, ER/PR negative, HER-2 +3+ by IHC ?  ?12/24/2020: Breast MRI revealed mass or non-mass enhancement measuring 9.5 cm and 3 abnormal lymph nodes ?01-02-21: Bone scan: No bone mets ?01/01/2021: CT CAP: No distant metastatic disease ?  ?Treatment Plan: ?1. Neoadjuvant chemotherapy with [place] Perjeta ?6 cycles followed by Herceptin Perjeta maintenance  ?2. left mastectomy 06/04/2021: Dr. Donne Hazel: Complete pathologic response, no residual cancer, 0/1 lymph node negative ?3. Followed by adjuvant radiation therapy completed 08/22/2021 ?-------------------------------------------------------------------------------------------------------------------------------- ?Current treatment: Herceptin Perjeta maintenance ?Toxicities: None ?Send a prescription for vitamin D 50,000 units ?COVID infection January 2023: Recovered from ?She completes Herceptin Perjeta in 3 weeks. ?We will obtain a right breast mammogram in the next month. ?She has an appointment for D IEP flap at [facility] for the left breast in May. ?I would like to see her in 6 months for follow-up. ? ?No orders of the defined types were placed in this encounter. ? ?The patient has a good understanding of the overall plan. she agrees with it.  she will call with any problems that may develop before the next visit here. ?Total time spent: 30 mins including face to face time and time spent for planning, charting and co-ordination of care ? ? Maria Ohara, MD ?12/27/21 ? ? ? I, Maria Kennedy, am acting as a scribe for Dr. Lindi Kennedy  ?

## 2021-12-25 NOTE — Therapy (Signed)
?OUTPATIENT PHYSICAL THERAPY TREATMENT NOTE ? ? ?Patient Name: Maria Kennedy ?MRN: [id number] ?DOB:[birth year], 51 y.o., female ?Today's Date: 12/25/2021 ? ?PCP: London Pepper, MD ?REFERRING PROVIDER: Nicholas Lose, MD ? ? PT End of Session - 12/25/21 1514   ? ? Visit Number 2   ? Number of Visits 12   ? Date for PT Re-Evaluation 01/29/22   ? PT Start Time 1287   pt late  ? PT Stop Time 1558   ? PT Time Calculation (min) 43 min   ? Activity Tolerance Patient tolerated treatment well   ? Behavior During Therapy [facility] for tasks assessed/performed   ? ?  ?  ? ?  ? ? ?Past Medical History:  ?Diagnosis Date  ? ADHD (attention deficit hyperactivity disorder)   ? Asthma   ? Cancer [facility])   ? breast ca  ? Depression   ? Family history of breast cancer   ? Family history of leukemia   ? Family history of lung cancer   ? Family history of pancreatic cancer   ? ?Past Surgical History:  ?Procedure Laterality Date  ? BREAST RECONSTRUCTION WITH PLACEMENT OF TISSUE EXPANDER AND ALLODERM Left 06/04/2021  ? Procedure: BREAST RECONSTRUCTION WITH PLACEMENT OF TISSUE EXPANDER AND ALLODERM;  Surgeon: Irene Limbo, MD;  Location: [place];  Service: Plastics;  Laterality: Left;  ? MASTECTOMY WITH RADIOACTIVE SEED GUIDED EXCISION AND AXILLARY SENTINEL LYMPH NODE BIOPSY Left 06/04/2021  ? Procedure: LEFT MASTECTOMY, LEFT AXILLARY SENTINEL LYMPH NODE BIOPSY, LEFT AXILLARY NODE SEED GUIDED EXCISION;  Surgeon: Rolm Bookbinder, MD;  Location: [place];  Service: General;  Laterality: Left;  ? PORTACATH PLACEMENT Right 01/07/2021  ? Procedure: INSERTION PORT-A-CATH;  Surgeon: Rolm Bookbinder, MD;  Location: [place];  Service: General;  Laterality: Right;  ? TUBAL LIGATION    ? ?Patient Active Problem List  ? Diagnosis Date Noted  ? S/P mastectomy, left 06/04/2021  ? Genetic testing 01/18/2021  ? Port-A-Cath in place 01/15/2021  ? Family history of breast cancer   ? Family history of lung  cancer   ? Family history of pancreatic cancer   ? Family history of leukemia   ? Malignant neoplasm of upper-outer quadrant of left breast in female, estrogen receptor negative ([place]) 12/24/2020  ? Benign paroxysmal positional vertigo of left ear 11/15/2018  ? ADD (attention deficit disorder) 08/11/2011  ? ? ?REFERRING DIAG: Left arm pain/heaviness/numbness ? ?THERAPY DIAG:Left arm numbness ?  ?Aftercare following surgery for neoplasm ?  ?Muscle weakness (generalized) ?  ?Stiffness of left shoulder, not elsewhere classified  ? ? ?PERTINENT HISTORY: Pt had left mastectomy with immediate expander fon 06/04/2021. 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 She had neoadjuvant chemo followed by surgery and radiation which ended in November 2022 ? ?PRECAUTIONS: lymphedema risk ? ?SUBJECTIVE: The stretches have helped and its not as sore. ROM seems like it is getting a little better, and it doesn't feel as restless as it did.  Sometimes it still itches. Numbness is still behind arm and in both fingertips. ? ?PAIN:  ?Are you having pain? Yes: NPRS scale: 3/10 ?Pain location: left chest/ rib cage ?Pain description: sore,restless ?Aggravating factors: a lot of use ?Relieving factors: tylenol, propping something under arm. ? ? ?OBJECTIVE ?  ?  ?POSTURE: round shoulders, forward head ?  ?UPPER EXTREMITY AROM/PROM: ?  ?A/PROM RIGHT  12/18/2021 ?   ?Shoulder extension 70  ?  Shoulder flexion 167  ?Shoulder abduction 180  ?Shoulder internal rotation 70  ?Shoulder external rotation 112  ?                        (Blank rows = not tested) ?  ?A/PROM LEFT  12/18/2021  ?Shoulder extension 55  ?Shoulder flexion 150  ?Shoulder abduction 134  ?Shoulder internal rotation 50  ?Shoulder external rotation 95  ?                        (Blank rows = not tested) ?  ?  ?  ?  ?  ?UPPER EXTREMITY STRENGTH: Grip right 52, left 42 ?  ?  ? ?LYMPHEDEMA ASSESSMENTS:  ?  ?LANDMARK RIGHT  12/18/2021   ?10 cm proximal to olecranon process 31.3  ?Olecranon process 23.7  ?10 cm proximal to ulnar styloid process 20.6  ?Just proximal to ulnar styloid process 15.5  ?Across hand at thumb web space 20.2  ?At base of 2nd digit 6.1  ?(Blank rows = not tested) ?  ?[place] LEFT  12/18/2021  ?10 cm proximal to olecranon process 31.6  ?Olecranon process 24  ?10 cm proximal to ulnar styloid process 20.7  ?Just proximal to ulnar styloid process 15.2  ?Across hand at thumb web space 20.8  ?At base of 2nd digit 6.3  ?(Blank rows = not tested) ?  ?  ?  ?  ?  ?  ?  ?  ?  ?  ?  ?QUICK DASH SURVEY: 41% ?  ?  ?TODAY'S TREATMENT ?12/25/2021  ?Pulleys x 2 min flexion and abduction ? Ball rolls on wall for flexion x10, abduction x 5 ?Soft tissue mobilization to UT, pecs, lats and lower ribs on left with cocoabutter. PROM                ? to Left  shoulder flexion, scaption, abduction, ER. ?Pt was instructed in trunk rotation to the right to stretch left lateral trunk ? ?PATIENT EDUCATION: trunk rotation. Pt return demonstrated, Pictures given ?  ?  ?HOME EXERCISE PROGRAM: ?Pectoral wall/doorway stretch, counter lat stretch, abduction wall slide, clasped hand flexion x 5 2x's per day, trunk rotation to left ?  ?ASSESSMENT: ?  ?CLINICAL IMPRESSION: ?Pt used pulleys for warm up and did ball rolls for flexion and abd. ? soft tissue mobilization performed and significant tightness felt in pecs and lats with tenderness also.  Pt felt good stretch in lateral trunk with trunk rotation to the right with arms in goal post position ? ?OBJECTIVE IMPAIRMENTS decreased activity tolerance, decreased ROM, decreased strength, increased fascial restrictions, increased muscle spasms, impaired flexibility, impaired UE functional use, postural dysfunction, and pain.  ?  ?ACTIVITY LIMITATIONS cleaning, occupation, yard work, and reaching activities .  ?  ?PERSONAL FACTORS 1-2 comorbidities: breast cancer with chemo and radiation therapy  are also affecting  patient's functional outcome.  ?  ?  ?REHAB POTENTIAL: Good ?  ?CLINICAL DECISION MAKING: Stable/uncomplicated ?  ?EVALUATION COMPLEXITY: Low ?  ?GOALS: ?Goals reviewed with patient? Yes ?  ?SHORT TERM GOALS: ?  ?Pt will be independent and compliant with HEP for Left shoulder ROM and strengthening ?Baseline:  ?Target date: 01/08/2022 ?Goal status: INITIAL ?  ?2.  Pt will have L shoulder flexion atleast 160 degrees ?Baseline: 150 ?Target date: 01/29/2022 ?Goal status: INITIAL ?  ?  ?  ?LONG TERM GOALS: ?  ?Pt will have left shoulder aduction atleast  165 degrees ?Baseline: 130 ?Target date: 01/29/2022 ?Goal status: INITIAL ?  ?2.  Quick dash will be no greater than 15% to demonstrate improvement in function ?Baseline: 41 ?Target date: 01/29/2022 ?Goal status: INITIAL ?  ?3.  Pt will report pain improved/heaviness improved by 50% OR GREATER ?Baseline:  ?Target date: 01/29/2022 ?Goal status: INITIAL ?  ?  ?  ?PLAN: ?PT FREQUENCY: 2x/week ?  ?PT DURATION: 6 weeks ?  ?PLANNED INTERVENTIONS: Therapeutic exercises, Therapeutic activity, Neuromuscular re-education, Patient/Family education, Joint mobilization, Orthotic/Fit training, and Manual therapy ?  ?PLAN FOR NEXT SESSION: STM left upper quarter especially pectorals, UT, supine wand flexion, scaption, PROM left shoulder, progress to strength when ready, grip strength ?  ?  ?Claris Pong, PT ?12/18/2021, 8:46 PM ?  ?  ?  ? ? ? ? ? ? ?Claris Pong, PT ?12/25/2021, 5:08 PM ? ?  ? ?

## 2021-12-27 ENCOUNTER — Other Ambulatory Visit: Payer: Self-pay

## 2021-12-27 ENCOUNTER — Inpatient Hospital Stay: Payer: Managed Care, Other (non HMO)

## 2021-12-27 ENCOUNTER — Inpatient Hospital Stay (HOSPITAL_BASED_OUTPATIENT_CLINIC_OR_DEPARTMENT_OTHER): Payer: Managed Care, Other (non HMO) | Admitting: Hematology and Oncology

## 2021-12-27 ENCOUNTER — Inpatient Hospital Stay: Payer: Managed Care, Other (non HMO) | Attending: Hematology and Oncology

## 2021-12-27 VITALS — BP 130/99 | HR 75 | Temp 97.5°F | Resp 18

## 2021-12-27 DIAGNOSIS — C50412 Malignant neoplasm of upper-outer quadrant of left female breast: Secondary | ICD-10-CM

## 2021-12-27 DIAGNOSIS — Z171 Estrogen receptor negative status [ER-]: Secondary | ICD-10-CM | POA: Diagnosis not present

## 2021-12-27 DIAGNOSIS — Z79899 Other long term (current) drug therapy: Secondary | ICD-10-CM | POA: Insufficient documentation

## 2021-12-27 DIAGNOSIS — Z5112 Encounter for antineoplastic immunotherapy: Secondary | ICD-10-CM | POA: Insufficient documentation

## 2021-12-27 DIAGNOSIS — Z95828 Presence of other vascular implants and grafts: Secondary | ICD-10-CM

## 2021-12-27 LAB — CBC WITH DIFFERENTIAL (CANCER CENTER ONLY)
Abs Immature Granulocytes: 0.01 10*3/uL (ref 0.00–0.07)
Basophils Absolute: 0 10*3/uL (ref 0.0–0.1)
Basophils Relative: 1 %
Eosinophils Absolute: 0.1 10*3/uL (ref 0.0–0.5)
Eosinophils Relative: 2 %
HCT: 37.9 % (ref 36.0–46.0)
Hemoglobin: 12.1 g/dL (ref 12.0–15.0)
Immature Granulocytes: 0 %
Lymphocytes Relative: 25 %
Lymphs Abs: 1.3 10*3/uL (ref 0.7–4.0)
MCH: 28.1 pg (ref 26.0–34.0)
MCHC: 31.9 g/dL (ref 30.0–36.0)
MCV: 87.9 fL (ref 80.0–100.0)
Monocytes Absolute: 0.2 10*3/uL (ref 0.1–1.0)
Monocytes Relative: 5 %
Neutro Abs: 3.5 10*3/uL (ref 1.7–7.7)
Neutrophils Relative %: 67 %
Platelet Count: 319 10*3/uL (ref 150–400)
RBC: 4.31 MIL/uL (ref 3.87–5.11)
RDW: 13.1 % (ref 11.5–15.5)
WBC Count: 5.2 10*3/uL (ref 4.0–10.5)
nRBC: 0 % (ref 0.0–0.2)

## 2021-12-27 LAB — CMP (CANCER CENTER ONLY)
ALT: 14 U/L (ref 0–44)
AST: 14 U/L — ABNORMAL LOW (ref 15–41)
Albumin: 4.4 g/dL (ref 3.5–5.0)
Alkaline Phosphatase: 74 U/L (ref 38–126)
Anion gap: 7 (ref 5–15)
BUN: 18 mg/dL (ref 6–20)
CO2: 26 mmol/L (ref 22–32)
Calcium: 9.5 mg/dL (ref 8.9–10.3)
Chloride: 106 mmol/L (ref 98–111)
Creatinine: 0.96 mg/dL (ref 0.44–1.00)
GFR, Estimated: >60 mL/min (ref >60)
Glucose, Bld: 227 mg/dL — ABNORMAL HIGH (ref 70–99)
Potassium: 3.8 mmol/L (ref 3.5–5.1)
Sodium: 139 mmol/L (ref 135–145)
Total Bilirubin: 0.3 mg/dL (ref 0.3–1.2)
Total Protein: 7.3 g/dL (ref 6.5–8.1)

## 2021-12-27 MED ORDER — SODIUM CHLORIDE 0.9 % IV SOLN
420.0000 mg | Freq: Once | INTRAVENOUS | Status: AC
  Administered 2021-12-27: 420 mg via INTRAVENOUS
  Filled 2021-12-27: qty 14

## 2021-12-27 MED ORDER — SODIUM CHLORIDE 0.9 % IV SOLN: Freq: Once | INTRAVENOUS | Status: AC

## 2021-12-27 MED ORDER — TRASTUZUMAB-ANNS CHEMO 150 MG IV SOLR
6.0000 mg/kg | Freq: Once | INTRAVENOUS | Status: AC
  Administered 2021-12-27: 462 mg via INTRAVENOUS
  Filled 2021-12-27: qty 22

## 2021-12-27 MED ORDER — SODIUM CHLORIDE 0.9% FLUSH
10.0000 mL | INTRAVENOUS | Status: DC | PRN
  Administered 2021-12-27: 10 mL

## 2021-12-27 MED ORDER — HEPARIN SOD (PORK) LOCK FLUSH 100 UNIT/ML IV SOLN
500.0000 [IU] | Freq: Once | INTRAVENOUS | Status: AC | PRN
  Administered 2021-12-27: 500 [IU]

## 2021-12-27 MED ORDER — DIPHENHYDRAMINE HCL 25 MG PO CAPS
25.0000 mg | ORAL_CAPSULE | Freq: Once | ORAL | Status: AC
  Administered 2021-12-27: 25 mg via ORAL
  Filled 2021-12-27: qty 1

## 2021-12-27 MED ORDER — ACETAMINOPHEN 325 MG PO TABS
650.0000 mg | ORAL_TABLET | Freq: Once | ORAL | Status: AC
  Administered 2021-12-27: 650 mg via ORAL
  Filled 2021-12-27: qty 2

## 2021-12-27 MED ORDER — SODIUM CHLORIDE 0.9% FLUSH
10.0000 mL | Freq: Once | INTRAVENOUS | Status: AC
  Administered 2021-12-27: 10 mL

## 2021-12-27 NOTE — Assessment & Plan Note (Signed)
12/19/2020: Palpable left breast mass and left axillary tenderness. ?Mammogram revealed conglomeration of masses 3.9 cm and an adjacent 0.7 cm mass and 2 axillary lymph nodes that were abnormal. 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 ?12/24/2020: Breast MRI revealed mass or non-mass enhancement measuring 9.5 cm and 3 abnormal lymph nodes ?01-02-21: Bone scan: No bone mets ?01/01/2021: CT CAP: No distant metastatic disease ?? ?Treatment Plan: ?1. Neoadjuvant chemotherapy with Hector Perjeta ?6 cycles followed by Herceptin Perjeta maintenance  ?2.?left mastectomy 06/04/2021: Dr. Donne Hazel: Complete pathologic response, no residual cancer, 0/1 lymph node negative ?3. Followed by adjuvant radiation therapy?completed 08/22/2021 ?-------------------------------------------------------------------------------------------------------------------------------- ?Current treatment:?Herceptin Perjeta maintenance ?Toxicities: None ?Send a prescription for vitamin D 50,000 units ?COVID infection January 2023: Recovered from ?Return to clinic every 3 weeks for Herceptin and Perjeta and every 6 weeks with follow-up with me. ?

## 2021-12-27 NOTE — Patient Instructions (Signed)
[facility]  Discharge Instructions: Thank you for choosing [facility] to provide your oncology and hematology care.   If you have a lab appointment with the Cancer Center, please go directly to the Cancer Center and check in at the registration area.   Wear comfortable clothing and clothing appropriate for easy access to any Portacath or PICC line.   We strive to give you quality time with your provider. You may need to reschedule your appointment if you arrive late (15 or more minutes).  Arriving late affects you and other patients whose appointments are after yours.  Also, if you miss three or more appointments without notifying the office, you may be dismissed from the clinic at the provider's discretion.      For prescription refill requests, have your pharmacy contact our office and allow 72 hours for refills to be completed.    Today you received the following chemotherapy and/or immunotherapy agents: Trastuzumab, Pertuzumab.      To help prevent nausea and vomiting after your treatment, we encourage you to take your nausea medication as directed.  BELOW ARE SYMPTOMS THAT SHOULD BE REPORTED IMMEDIATELY: *FEVER GREATER THAN 100.4 F (38 C) OR HIGHER *CHILLS OR SWEATING *NAUSEA AND VOMITING THAT IS NOT CONTROLLED WITH YOUR NAUSEA MEDICATION *UNUSUAL SHORTNESS OF BREATH *UNUSUAL BRUISING OR BLEEDING *URINARY PROBLEMS (pain or burning when urinating, or frequent urination) *BOWEL PROBLEMS (unusual diarrhea, constipation, pain near the anus) TENDERNESS IN MOUTH AND THROAT WITH OR WITHOUT PRESENCE OF ULCERS (sore throat, sores in mouth, or a toothache) UNUSUAL RASH, SWELLING OR PAIN  UNUSUAL VAGINAL DISCHARGE OR ITCHING   Items with * indicate a potential emergency and should be followed up as soon as possible or go to the Emergency Department if any problems should occur.  Please show the CHEMOTHERAPY ALERT CARD or IMMUNOTHERAPY ALERT CARD  at check-in to the Emergency Department and triage nurse.  Should you have questions after your visit or need to cancel or reschedule your appointment, please contact [facility]  Dept: [phone]  and follow the prompts.  Office hours are 8:00 a.m. to 4:30 p.m. Monday - Friday. Please note that voicemails left after 4:00 p.m. may not be returned until the following business day.  We are closed weekends and major holidays. You have access to a nurse at all times for urgent questions. Please call the main number to the clinic Dept: [phone] and follow the prompts.   For any non-urgent questions, you may also contact your provider using MyChart. We now offer e-Visits for anyone 18 and older to request care online for non-urgent symptoms. For details visit [email or web address].   Also download the MyChart app! Go to the app store, search "MyChart", open the app, select [place], and log in with your MyChart username and password.  Due to Covid, a mask is required upon entering the hospital/clinic. If you do not have a mask, one will be given to you upon arrival. For doctor visits, patients may have 1 support person aged 18 or older with them. For treatment visits, patients cannot have anyone with them due to current Covid guidelines and our immunocompromised population.  

## 2021-12-30 ENCOUNTER — Ambulatory Visit: Payer: Managed Care, Other (non HMO)

## 2021-12-30 ENCOUNTER — Telehealth: Payer: Self-pay | Admitting: Hematology and Oncology

## 2021-12-30 ENCOUNTER — Other Ambulatory Visit: Payer: Self-pay

## 2021-12-30 DIAGNOSIS — M6281 Muscle weakness (generalized): Secondary | ICD-10-CM

## 2021-12-30 DIAGNOSIS — R200 Anesthesia of skin: Secondary | ICD-10-CM

## 2021-12-30 DIAGNOSIS — Z483 Aftercare following surgery for neoplasm: Secondary | ICD-10-CM

## 2021-12-30 DIAGNOSIS — M25612 Stiffness of left shoulder, not elsewhere classified: Secondary | ICD-10-CM

## 2021-12-30 NOTE — Telephone Encounter (Signed)
Scheduled appointment per 3/17 los. Patient is aware. ?

## 2021-12-30 NOTE — Therapy (Signed)
?OUTPATIENT PHYSICAL THERAPY TREATMENT NOTE ? ? ?Patient Name: Maria Kennedy ?MRN: [id number] ?DOB:02-Aug-1971, 51 y.o., female ?Today's Date: 12/30/2021 ? ?PCP: London Pepper, MD ?REFERRING PROVIDER: Nicholas Lose, MD ? ? PT End of Session - 12/30/21 1104   ? ? Visit Number 3   ? Number of Visits 12   ? Date for PT Re-Evaluation 01/29/22   ? PT Start Time 1106   ? PT Stop Time 2979   ? PT Time Calculation (min) 46 min   ? Activity Tolerance Patient tolerated treatment well   ? Behavior During Therapy [facility] for tasks assessed/performed   ? ?  ?  ? ?  ? ? ?Past Medical History:  ?Diagnosis Date  ? ADHD (attention deficit hyperactivity disorder)   ? Asthma   ? Cancer [facility])   ? breast ca  ? Depression   ? Family history of breast cancer   ? Family history of leukemia   ? Family history of lung cancer   ? Family history of pancreatic cancer   ? ?Past Surgical History:  ?Procedure Laterality Date  ? BREAST RECONSTRUCTION WITH PLACEMENT OF TISSUE EXPANDER AND ALLODERM Left 06/04/2021  ? Procedure: BREAST RECONSTRUCTION WITH PLACEMENT OF TISSUE EXPANDER AND ALLODERM;  Surgeon: Irene Limbo, MD;  Location: [place];  Service: Plastics;  Laterality: Left;  ? MASTECTOMY WITH RADIOACTIVE SEED GUIDED EXCISION AND AXILLARY SENTINEL LYMPH NODE BIOPSY Left 06/04/2021  ? Procedure: LEFT MASTECTOMY, LEFT AXILLARY SENTINEL LYMPH NODE BIOPSY, LEFT AXILLARY NODE SEED GUIDED EXCISION;  Surgeon: Rolm Bookbinder, MD;  Location: [place];  Service: General;  Laterality: Left;  ? PORTACATH PLACEMENT Right 01/07/2021  ? Procedure: INSERTION PORT-A-CATH;  Surgeon: Rolm Bookbinder, MD;  Location: [place];  Service: General;  Laterality: Right;  ? TUBAL LIGATION    ? ?Patient Active Problem List  ? Diagnosis Date Noted  ? S/P mastectomy, left 06/04/2021  ? Genetic testing 01/18/2021  ? Port-A-Cath in place 01/15/2021  ? Family history of breast cancer   ? Family history of lung cancer   ?  Family history of pancreatic cancer   ? Family history of leukemia   ? Malignant neoplasm of upper-outer quadrant of left breast in female, estrogen receptor negative ([place]) 12/24/2020  ? Benign paroxysmal positional vertigo of left ear 11/15/2018  ? ADD (attention deficit disorder) 08/11/2011  ? ? ?REFERRING DIAG: Left arm pain/heaviness/numbness ? ?THERAPY DIAG:Left arm numbness ?  ?Aftercare following surgery for neoplasm ?  ?Muscle weakness (generalized) ?  ?Stiffness of left shoulder, not elsewhere classified  ? ? ?PERTINENT HISTORY: Pt had left mastectomy with immediate expander fon 06/04/2021. 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 She had neoadjuvant chemo followed by surgery and radiation which ended in November 2022 ? ?PRECAUTIONS: lymphedema risk ? ?SUBJECTIVE: I did OK last time. I went home and took a nap. The tenderness especially at my ribs got better after last visit.Tightness isn't too bad. Pain is 15% better. I can open a jar better. I haven't noticed my arm hurting as much when I do things at home. The restlessness of my arm is better and I don't have to prop my arm with pillows as much. ? ? ?PAIN:  ?Are you having pain? No, but I am tender at my chest ? ? ?OBJECTIVE ?  ?  ?POSTURE: round shoulders, forward head ?  ?UPPER EXTREMITY AROM/PROM: ?  ?A/PROM RIGHT  12/18/2021 ?   ?Shoulder extension 70  ?Shoulder flexion 167  ?Shoulder abduction 180  ?Shoulder internal rotation 70  ?Shoulder external rotation 112  ?                        (Blank rows = not tested) ?  ?A/PROM LEFT  12/18/2021 Left 12/30/2021  ?Shoulder extension 55 59  ?Shoulder flexion 150 167  ?Shoulder abduction 134 169  ?Shoulder internal rotation 50 65  ?Shoulder external rotation 95 103  ?                        (Blank rows = not tested) ?  ?  ?  ?  ?  ?UPPER EXTREMITY STRENGTH: Grip right 52, left 42 ?  ?  ? ?LYMPHEDEMA ASSESSMENTS:  ?  ?LANDMARK RIGHT  12/18/2021  ?10 cm  proximal to olecranon process 31.3  ?Olecranon process 23.7  ?10 cm proximal to ulnar styloid process 20.6  ?Just proximal to ulnar styloid process 15.5  ?Across hand at thumb web space 20.2  ?At base of 2nd digit 6.1  ?(Blank rows = not tested) ?  ?[place] LEFT  12/18/2021  ?10 cm proximal to olecranon process 31.6  ?Olecranon process 24  ?10 cm proximal to ulnar styloid process 20.7  ?Just proximal to ulnar styloid process 15.2  ?Across hand at thumb web space 20.8  ?At base of 2nd digit 6.3  ?(Blank rows = not tested) ?  ?  ?  ?  ?  ?  ?  ?  ?  ?  ?  ?QUICK DASH SURVEY: 41% ?  ?  ?TODAY'S TREATMENT ?12/30/2021 ?Pulleys x 2 min flexion and abduction ? Ball rolls on wall for flexion x10, abduction x 5 ?Soft tissue mobilization to UT, pecs, lats and lower ribs on left with cocoabutter. PROM                ? to Left  shoulder flexion, scaption, abduction, ER. ?Supine horizontal abd, bilateral ER, shoulder flexion with yellow theraband in supine x 5 ?Showed lumbar roll to use in chair when working to support back and improve posture ?12/25/2021  ?Pulleys x 2 min flexion and abduction ? Ball rolls on wall for flexion x10, abduction x 5 ?Soft tissue mobilization to UT, pecs, lats and lower ribs on left with cocoabutter. PROM                ? to Left  shoulder flexion, scaption, abduction, ER. ?Pt was instructed in trunk rotation to the right to stretch left lateral trunk ? ?PATIENT EDUCATION: trunk rotation. Pt return demonstrated, Pictures given ?  ?  ?HOME EXERCISE PROGRAM: ?Pectoral wall/doorway stretch, counter lat stretch, abduction wall slide, clasped hand flexion x 5 2x's per day, trunk rotation to left ?  ?ASSESSMENT: ?  ?CLINICAL IMPRESSION: ?Pt used pulleys for warm up and did ball rolls for flexion and abd. ? soft tissue mobilization performed and significant tightness felt in pecs and lats with tenderness also.  Pt is feeling much better overall. No increased pain with theraband exercises. Excellent improvement  in left shoulder ROM and achieved STG for shoulder flexion. Pt notices less pain with daily activities. ? ?OBJECTIVE IMPAIRMENTS decreased activity tolerance, decreased ROM, decreased strength, increased fascial restrictions, increased muscle spasms, impaired flexibility, impaired UE functional use, postural dysfunction, and pain.  ?  ?ACTIVITY LIMITATIONS cleaning, occupation, yard work, and reaching activities .  ?  ?  PERSONAL FACTORS 1-2 comorbidities: breast cancer with chemo and radiation therapy  are also affecting patient's functional outcome.  ?  ?  ?REHAB POTENTIAL: Good ?  ?CLINICAL DECISION MAKING: Stable/uncomplicated ?  ?EVALUATION COMPLEXITY: Low ?  ?GOALS: ?Goals reviewed with patient? Yes ?  ?SHORT TERM GOALS: ?  ?Pt will be independent and compliant with HEP for Left shoulder ROM and strengthening ?Baseline:  ?Target date: 01/08/2022 ?Goal status: INITIAL ?  ?2.  Pt will have L shoulder flexion atleast 160 degrees ?Baseline: 150, 167 today ?Target date: 01/29/2022 ?Goal status: MET 12/30/2021 ?  ?  ?  ?LONG TERM GOALS: ?  ?Pt will have left shoulder aduction atleast 165 degrees ?Baseline: 130 ?Target date: 01/29/2022 ?Goal status: INITIAL ?  ?2.  Quick dash will be no greater than 15% to demonstrate improvement in function ?Baseline: 41 ?Target date: 01/29/2022 ?Goal status: INITIAL ?  ?3.  Pt will report pain improved/heaviness improved by 50% OR GREATER ?Baseline:  ?Target date: 01/29/2022 ?Goal status: INITIAL ?  ?  ?  ?PLAN: ?PT FREQUENCY: 2x/week ?  ?PT DURATION: 6 weeks ?  ?PLANNED INTERVENTIONS: Therapeutic exercises, Therapeutic activity, Neuromuscular re-education, Patient/Family education, Joint mobilization, Orthotic/Fit training, and Manual therapy ?  ?PLAN FOR NEXT SESSION: review supine scap series, add to HEP STM left upper quarter especially pectorals, UT, supine wand flexion, scaption, PROM left shoulder, progress to strength when ready, grip strength ?  ?  ?Claris Pong, PT ?12/18/2021,  8:46 PM ?  ?  ?  ? ? ? ? ? ? ?Claris Pong, PT ?12/30/2021, 11:59 AM ? ?  ? ?

## 2022-01-06 ENCOUNTER — Other Ambulatory Visit: Payer: Self-pay

## 2022-01-06 ENCOUNTER — Ambulatory Visit: Payer: Managed Care, Other (non HMO)

## 2022-01-06 DIAGNOSIS — R200 Anesthesia of skin: Secondary | ICD-10-CM | POA: Diagnosis not present

## 2022-01-06 DIAGNOSIS — Z171 Estrogen receptor negative status [ER-]: Secondary | ICD-10-CM

## 2022-01-06 DIAGNOSIS — M6281 Muscle weakness (generalized): Secondary | ICD-10-CM

## 2022-01-06 DIAGNOSIS — Z483 Aftercare following surgery for neoplasm: Secondary | ICD-10-CM

## 2022-01-06 DIAGNOSIS — M25612 Stiffness of left shoulder, not elsewhere classified: Secondary | ICD-10-CM

## 2022-01-06 NOTE — Therapy (Signed)
?OUTPATIENT PHYSICAL THERAPY TREATMENT NOTE ? ? ?Patient Name: Maria Kennedy ?MRN: [id number] ?DOB:1971/04/02, 51 y.o., female ?Today's Date: 01/06/2022 ? ?PCP: London Pepper, MD ?REFERRING PROVIDER: Nicholas Lose, MD ? ? PT End of Session - 01/06/22 1601   ? ? Visit Number 4   ? Number of Visits 12   ? Date for PT Re-Evaluation 01/29/22   ? PT Start Time 1509   pt late  ? PT Stop Time 8676   ? PT Time Calculation (min) 46 min   ? Activity Tolerance Patient tolerated treatment well   ? Behavior During Therapy [facility] for tasks assessed/performed   ? ?  ?  ? ?  ? ? ?Past Medical History:  ?Diagnosis Date  ? ADHD (attention deficit hyperactivity disorder)   ? Asthma   ? Cancer [facility])   ? breast ca  ? Depression   ? Family history of breast cancer   ? Family history of leukemia   ? Family history of lung cancer   ? Family history of pancreatic cancer   ? ?Past Surgical History:  ?Procedure Laterality Date  ? BREAST RECONSTRUCTION WITH PLACEMENT OF TISSUE EXPANDER AND ALLODERM Left 06/04/2021  ? Procedure: BREAST RECONSTRUCTION WITH PLACEMENT OF TISSUE EXPANDER AND ALLODERM;  Surgeon: Irene Limbo, MD;  Location: [place];  Service: Plastics;  Laterality: Left;  ? MASTECTOMY WITH RADIOACTIVE SEED GUIDED EXCISION AND AXILLARY SENTINEL LYMPH NODE BIOPSY Left 06/04/2021  ? Procedure: LEFT MASTECTOMY, LEFT AXILLARY SENTINEL LYMPH NODE BIOPSY, LEFT AXILLARY NODE SEED GUIDED EXCISION;  Surgeon: Rolm Bookbinder, MD;  Location: [place];  Service: General;  Laterality: Left;  ? PORTACATH PLACEMENT Right 01/07/2021  ? Procedure: INSERTION PORT-A-CATH;  Surgeon: Rolm Bookbinder, MD;  Location: [place];  Service: General;  Laterality: Right;  ? TUBAL LIGATION    ? ?Patient Active Problem List  ? Diagnosis Date Noted  ? S/P mastectomy, left 06/04/2021  ? Genetic testing 01/18/2021  ? Port-A-Cath in place 01/15/2021  ? Family history of breast cancer   ? Family history of lung  cancer   ? Family history of pancreatic cancer   ? Family history of leukemia   ? Malignant neoplasm of upper-outer quadrant of left breast in female, estrogen receptor negative ([place]) 12/24/2020  ? Benign paroxysmal positional vertigo of left ear 11/15/2018  ? ADD (attention deficit disorder) 08/11/2011  ? ? ?REFERRING DIAG: Left arm pain/heaviness/numbness ? ?THERAPY DIAG:Left arm numbness ?  ?Aftercare following surgery for neoplasm ?  ?Muscle weakness (generalized) ?  ?Stiffness of left shoulder, not elsewhere classified  ? ? ?PERTINENT HISTORY: Pt had left mastectomy with immediate expander fon 06/04/2021. 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 She had neoadjuvant chemo followed by surgery and radiation which ended in November 2022 ? ?PRECAUTIONS: lymphedema risk ? ?SUBJECTIVE:  I did rest when I went home but didn't have to take a nap.My left shoulder doesn't feel as tight as I did. Under my breast still feels like it spasms some when I stretch. I had a bad pain, and then it went away in a few seconds. I haven't taken my ADD medicine today so this is natural energy today. ? ? ?PAIN:  ?Are you having pain? No, but I am tender at my chest especially under my ribs. The massage does help it. ? ? ?OBJECTIVE ?  ?  ?POSTURE: round shoulders, forward head ?  ?UPPER  EXTREMITY AROM/PROM: ?  ?A/PROM RIGHT  12/18/2021 ?   ?Shoulder extension 70  ?Shoulder flexion 167  ?Shoulder abduction 180  ?Shoulder internal rotation 70  ?Shoulder external rotation 112  ?                        (Blank rows = not tested) ?  ?A/PROM LEFT  12/18/2021 Left 12/30/2021  ?Shoulder extension 55 59  ?Shoulder flexion 150 167  ?Shoulder abduction 134 169  ?Shoulder internal rotation 50 65  ?Shoulder external rotation 95 103  ?                        (Blank rows = not tested) ?  ?  ?  ?  ?  ?UPPER EXTREMITY STRENGTH: Grip right 52, left 42 ?  ?  ? ?LYMPHEDEMA ASSESSMENTS:  ?  ?LANDMARK  RIGHT  12/18/2021  ?10 cm proximal to olecranon process 31.3  ?Olecranon process 23.7  ?10 cm proximal to ulnar styloid process 20.6  ?Just proximal to ulnar styloid process 15.5  ?Across hand at thumb web space 20.2  ?At base of 2nd digit 6.1  ?(Blank rows = not tested) ?  ?[place] LEFT  12/18/2021  ?10 cm proximal to olecranon process 31.6  ?Olecranon process 24  ?10 cm proximal to ulnar styloid process 20.7  ?Just proximal to ulnar styloid process 15.2  ?Across hand at thumb web space 20.8  ?At base of 2nd digit 6.3  ?(Blank rows = not tested) ?  ?  ?  ?  ?  ?  ?  ?  ?  ?  ?  ?QUICK DASH SURVEY: 41% ?  ?  ?TODAY'S TREATMENT ?01/06/2022 ?Pulleys 2 min flex and abd, ball rolls x 10 flexion, x 5 abd ?           Soft tissue mobilization to left UT, Levator,pecs, lats and lower ribs and in SL to lats  ?            and scapular area with cocoabutter. PROM  to Left  shoulder flexion, scaption, abduction, ER. ?Supine scapular series with yellow band x 5 ? ? ?12/30/2021 ?Pulleys x 2 min flexion and abduction ? Ball rolls on wall for flexion x10, abduction x 5 ?Soft tissue mobilization to UT, pecs, lats and lower ribs on left with cocoabutter. PROM                ? to Left  shoulder flexion, scaption, abduction, ER. ?Supine horizontal abd, bilateral ER, shoulder flexion with yellow theraband in supine x 5 ?Showed lumbar roll to use in chair when working to support back and improve posture ?12/25/2021  ?Pulleys x 2 min flexion and abduction ? Ball rolls on wall for flexion x10, abduction x 5 ?Soft tissue mobilization to UT, pecs, lats and lower ribs on left with cocoabutter. PROM                ? to Left  shoulder flexion, scaption, abduction, ER. ?Pt was instructed in trunk rotation to the right to stretch left lateral trunk ? ?PATIENT EDUCATION: trunk rotation. Pt return demonstrated, Pictures given ?  ?  ?HOME EXERCISE PROGRAM: ?Pectoral wall/doorway stretch, counter lat stretch, abduction wall slide, clasped hand flexion x 5  2x's per day, trunk rotation to left ?  ?ASSESSMENT: ?  ?CLINICAL IMPRESSION: ?Pt used pulleys for warm up and did ball rolls for flexion and abd. ? soft  tissue mobilization performed . No significant tightness felt in pecs and lats.  Some tightness and tenderness in levator scapula and left ribs.Pt is feeling much better overall. No increased pain with theraband exercises. Pt notices less pain with daily activities. ? ?OBJECTIVE IMPAIRMENTS decreased activity tolerance, decreased ROM, decreased strength, increased fascial restrictions, increased muscle spasms, impaired flexibility, impaired UE functional use, postural dysfunction, and pain.  ?  ?ACTIVITY LIMITATIONS cleaning, occupation, yard work, and reaching activities .  ?  ?PERSONAL FACTORS 1-2 comorbidities: breast cancer with chemo and radiation therapy  are also affecting patient's functional outcome.  ?  ?  ?REHAB POTENTIAL: Good ?  ?CLINICAL DECISION MAKING: Stable/uncomplicated ?  ?EVALUATION COMPLEXITY: Low ?  ?GOALS: ?Goals reviewed with patient? Yes ?  ?SHORT TERM GOALS: ?  ?Pt will be independent and compliant with HEP for Left shoulder ROM and strengthening ?Baseline:  ?Target date: 01/08/2022 ?Goal status: INITIAL ?  ?2.  Pt will have L shoulder flexion atleast 160 degrees ?Baseline: 150, 167 today ?Target date: 01/29/2022 ?Goal status: MET 12/30/2021 ?  ?  ?  ?LONG TERM GOALS: ?  ?Pt will have left shoulder aduction atleast 165 degrees ?Baseline: 130 ?Target date: 01/29/2022 ?Goal status: INITIAL ?  ?2.  Quick dash will be no greater than 15% to demonstrate improvement in function ?Baseline: 41 ?Target date: 01/29/2022 ?Goal status: INITIAL ?  ?3.  Pt will report pain improved/heaviness improved by 50% OR GREATER ?Baseline:  ?Target date: 01/29/2022 ?Goal status: INITIAL ?  ?  ?  ?PLAN: ?PT FREQUENCY: 2x/week ?  ?PT DURATION: 6 weeks ?  ?PLANNED INTERVENTIONS: Therapeutic exercises, Therapeutic activity, Neuromuscular re-education, Patient/Family  education, Joint mobilization, Orthotic/Fit training, and Manual therapy ?  ?PLAN FOR NEXT SESSION: review supine scap series, add to HEP STM left upper quarter especially pectorals, UT, supine wand flexion, s

## 2022-01-08 ENCOUNTER — Ambulatory Visit: Payer: Managed Care, Other (non HMO)

## 2022-01-08 DIAGNOSIS — Z483 Aftercare following surgery for neoplasm: Secondary | ICD-10-CM

## 2022-01-08 DIAGNOSIS — M6281 Muscle weakness (generalized): Secondary | ICD-10-CM

## 2022-01-08 DIAGNOSIS — R200 Anesthesia of skin: Secondary | ICD-10-CM | POA: Diagnosis not present

## 2022-01-08 DIAGNOSIS — M25612 Stiffness of left shoulder, not elsewhere classified: Secondary | ICD-10-CM

## 2022-01-08 DIAGNOSIS — C50412 Malignant neoplasm of upper-outer quadrant of left female breast: Secondary | ICD-10-CM

## 2022-01-08 NOTE — Therapy (Signed)
?OUTPATIENT PHYSICAL THERAPY TREATMENT NOTE ? ? ?Patient Name: Maria Kennedy ?MRN: [id number] ?DOB:[birth year], 51 y.o., female ?Today's Date: 01/08/2022 ? ?PCP: London Pepper, MD ?REFERRING PROVIDER: Nicholas Lose, MD ? ? PT End of Session - 01/08/22 1458   ? ? Visit Number 5   ? Number of Visits 12   ? Date for PT Re-Evaluation 01/29/22   ? PT Start Time 5462   ? PT Stop Time 1546   ? PT Time Calculation (min) 48 min   ? Activity Tolerance Patient tolerated treatment well   ? Behavior During Therapy [facility] for tasks assessed/performed   ? ?  ?  ? ?  ? ? ?Past Medical History:  ?Diagnosis Date  ? ADHD (attention deficit hyperactivity disorder)   ? Asthma   ? Cancer [facility])   ? breast ca  ? Depression   ? Family history of breast cancer   ? Family history of leukemia   ? Family history of lung cancer   ? Family history of pancreatic cancer   ? ?Past Surgical History:  ?Procedure Laterality Date  ? BREAST RECONSTRUCTION WITH PLACEMENT OF TISSUE EXPANDER AND ALLODERM Left 06/04/2021  ? Procedure: BREAST RECONSTRUCTION WITH PLACEMENT OF TISSUE EXPANDER AND ALLODERM;  Surgeon: Irene Limbo, MD;  Location: [place];  Service: Plastics;  Laterality: Left;  ? MASTECTOMY WITH RADIOACTIVE SEED GUIDED EXCISION AND AXILLARY SENTINEL LYMPH NODE BIOPSY Left 06/04/2021  ? Procedure: LEFT MASTECTOMY, LEFT AXILLARY SENTINEL LYMPH NODE BIOPSY, LEFT AXILLARY NODE SEED GUIDED EXCISION;  Surgeon: Rolm Bookbinder, MD;  Location: [place];  Service: General;  Laterality: Left;  ? PORTACATH PLACEMENT Right 01/07/2021  ? Procedure: INSERTION PORT-A-CATH;  Surgeon: Rolm Bookbinder, MD;  Location: [place];  Service: General;  Laterality: Right;  ? TUBAL LIGATION    ? ?Patient Active Problem List  ? Diagnosis Date Noted  ? S/P mastectomy, left 06/04/2021  ? Genetic testing 01/18/2021  ? Port-A-Cath in place 01/15/2021  ? Family history of breast cancer   ? Family history of lung cancer   ?  Family history of pancreatic cancer   ? Family history of leukemia   ? Malignant neoplasm of upper-outer quadrant of left breast in female, estrogen receptor negative ([place]) 12/24/2020  ? Benign paroxysmal positional vertigo of left ear 11/15/2018  ? ADD (attention deficit disorder) 08/11/2011  ? ? ?REFERRING DIAG: Left arm pain/heaviness/numbness ? ?THERAPY DIAG:Left arm numbness ?  ?Aftercare following surgery for neoplasm ?  ?Muscle weakness (generalized) ?  ?Stiffness of left shoulder, not elsewhere classified  ? ? ?PERTINENT HISTORY: Pt had left mastectomy with immediate expander fon 06/04/2021. 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 She had neoadjuvant chemo followed by surgery and radiation which ended in November 2022 ? ?PRECAUTIONS: lymphedema risk ? ?SUBJECTIVE:  I did OK after last visit. If feels pretty good.  Still feels a little tight at the chest. My arm felt a little restless yesterday but I did the stretch with trunk rotation and that helped. Overall pain at least 40% better ? ?PAIN:  ?Are you having pain? No, but I am tender at my chest especially under my ribs. The massage does help it. ? ? ?OBJECTIVE ?  ?  ?POSTURE: round shoulders, forward head ?  ?UPPER EXTREMITY AROM/PROM: ?  ?A/PROM RIGHT  12/18/2021 ?   ?Shoulder extension 70  ?Shoulder flexion 167  ?Shoulder abduction 180  ?  Shoulder internal rotation 70  ?Shoulder external rotation 112  ?                        (Blank rows = not tested) ?  ?A/PROM LEFT  12/18/2021 Left 12/30/2021  ?Shoulder extension 55 59  ?Shoulder flexion 150 167  ?Shoulder abduction 134 169  ?Shoulder internal rotation 50 65  ?Shoulder external rotation 95 103  ?                        (Blank rows = not tested) ?  ?  ?  ?  ?  ?UPPER EXTREMITY STRENGTH: Grip right 52, left 42 ?  ?  ? ?LYMPHEDEMA ASSESSMENTS:  ?  ?LANDMARK RIGHT  12/18/2021  ?10 cm proximal to olecranon process 31.3  ?Olecranon process 23.7  ?10 cm  proximal to ulnar styloid process 20.6  ?Just proximal to ulnar styloid process 15.5  ?Across hand at thumb web space 20.2  ?At base of 2nd digit 6.1  ?(Blank rows = not tested) ?  ?[place] LEFT  12/18/2021  ?10 cm proximal to olecranon process 31.6  ?Olecranon process 24  ?10 cm proximal to ulnar styloid process 20.7  ?Just proximal to ulnar styloid process 15.2  ?Across hand at thumb web space 20.8  ?At base of 2nd digit 6.3  ?(Blank rows = not tested) ?  ?  ?  ?  ?  ?  ?  ?  ?  ?  ?  ?QUICK DASH SURVEY: 41% ?  ?  ?TODAY'S TREATMENT ?01/08/2022 ? ?Soft tissue mobilization to left UT, Levator,pecs, lats and lower ribs and in SL to lats ?            and scapular area with cocoabutter. PROM  to Left  shoulder flexion, scaption, abduction, ER. ?Supine wand flexion and scaption x5 ?Supine AROM bilateral flexion, scaption and horizontal abduction ?Supine scapular series with yellow band x 10 reps except diagonals x 6 ?Lower trunk rotation to the right with hands in goal post position x 5 ? ?01/06/2022 ?Pulleys 2 min flex and abd, ball rolls x 10 flexion, x 5 abd ?           Soft tissue mobilization to left UT, Levator,pecs, lats and lower ribs and in SL to lats  ?            and scapular area with cocoabutter. PROM  to Left  shoulder flexion, scaption, abduction, ER. ?Supine scapular series with yellow band x 5 ? ? ?12/30/2021 ?Pulleys x 2 min flexion and abduction ?Ball rolls on wall for flexion x10, abduction x 5 ?            Soft tissue mobilization to UT, pecs, lats and lower ribs on left with cocoabutter.  ?            PROM   to Left  shoulder flexion, scaption, abduction, ER. ?Supine horizontal abd, bilateral ER, shoulder flexion with yellow theraband in supine x 5 ?Showed lumbar roll to use in chair when working to support back and improve posture ?PATIENT EDUCATION: trunk rotation. Pt return demonstrated, Pictures given ?  ?  ?HOME EXERCISE PROGRAM: ?Pectoral wall/doorway stretch, counter lat stretch, abduction wall  slide, clasped hand flexion x 5 2x's per day, trunk rotation to left ?  ?ASSESSMENT: ?  ?CLINICAL IMPRESSION: ?Continued soft tissue mobilization to left upper quarter. Much tighter again today in pecs and lats.  Pt. Mentioned she avoids stretching on that left side however, advised her that her body wants to be stretched and that's why she is remaining tight. She will be more mindful of using her left side when she can as she has avoided it so long.  She notes atleast 40% improvement in overall pain.  PROM looked excellent today. ? ? ?OBJECTIVE IMPAIRMENTS decreased activity tolerance, decreased ROM, decreased strength, increased fascial restrictions, increased muscle spasms, impaired flexibility, impaired UE functional use, postural dysfunction, and pain.  ?  ?ACTIVITY LIMITATIONS cleaning, occupation, yard work, and reaching activities .  ?  ?PERSONAL FACTORS 1-2 comorbidities: breast cancer with chemo and radiation therapy  are also affecting patient's functional outcome.  ?  ?  ?REHAB POTENTIAL: Good ?  ?CLINICAL DECISION MAKING: Stable/uncomplicated ?  ?EVALUATION COMPLEXITY: Low ?  ?GOALS: ?Goals reviewed with patient? Yes ?  ?SHORT TERM GOALS: ?  ?Pt will be independent and compliant with HEP for Left shoulder ROM and strengthening ?Baseline:  ?Target date: 01/08/2022 ?Goal status: MET ?  ?2.  Pt will have L shoulder flexion atleast 160 degrees ?Baseline: 150, 167 today ?Target date: 01/29/2022 ?Goal status: MET 12/30/2021 ?  ?  ?  ?LONG TERM GOALS: ?  ?Pt will have left shoulder aduction atleast 165 degrees ?Baseline: 130 ?Target date: 01/29/2022 ?Goal status: INITIAL ?  ?2.  Quick dash will be no greater than 15% to demonstrate improvement in function ?Baseline: 41 ?Target date: 01/29/2022 ?Goal status: INITIAL ?  ?3.  Pt will report pain improved/heaviness improved by 50% OR GREATER ?Baseline: 40 % better 01/08/2022 ?Target date: 01/29/2022 ?Goal status: INITIAL ?  ?  ?  ?PLAN: ?PT FREQUENCY: 2x/week ?  ?PT  DURATION: 6 weeks ?  ?PLANNED INTERVENTIONS: Therapeutic exercises, Therapeutic activity, Neuromuscular re-education, Patient/Family education, Joint mobilization, Orthotic/Fit training, and Manual thera

## 2022-01-13 ENCOUNTER — Ambulatory Visit: Payer: Managed Care, Other (non HMO) | Attending: Hematology and Oncology

## 2022-01-13 DIAGNOSIS — M25612 Stiffness of left shoulder, not elsewhere classified: Secondary | ICD-10-CM | POA: Insufficient documentation

## 2022-01-13 DIAGNOSIS — Z483 Aftercare following surgery for neoplasm: Secondary | ICD-10-CM | POA: Insufficient documentation

## 2022-01-13 DIAGNOSIS — Z171 Estrogen receptor negative status [ER-]: Secondary | ICD-10-CM | POA: Insufficient documentation

## 2022-01-13 DIAGNOSIS — R200 Anesthesia of skin: Secondary | ICD-10-CM | POA: Insufficient documentation

## 2022-01-13 DIAGNOSIS — M6281 Muscle weakness (generalized): Secondary | ICD-10-CM | POA: Diagnosis present

## 2022-01-13 DIAGNOSIS — C50412 Malignant neoplasm of upper-outer quadrant of left female breast: Secondary | ICD-10-CM | POA: Diagnosis present

## 2022-01-13 NOTE — Therapy (Signed)
?OUTPATIENT PHYSICAL THERAPY TREATMENT NOTE ? ? ?Patient Name: Maria Kennedy ?MRN: [id number] ?DOB:1971/01/28, 51 y.o., female ?Today's Date: 01/13/2022 ? ?PCP: London Pepper, MD ?REFERRING PROVIDER: Nicholas Lose, MD ? ? PT End of Session - 01/13/22 1610   ? ? Visit Number 6   ? Number of Visits 12   ? Date for PT Re-Evaluation 01/29/22   ? PT Start Time 5974   ? PT Stop Time 1702   ? PT Time Calculation (min) 56 min   ? Activity Tolerance Patient tolerated treatment well   ? Behavior During Therapy [facility] for tasks assessed/performed   ? ?  ?  ? ?  ? ? ?Past Medical History:  ?Diagnosis Date  ? ADHD (attention deficit hyperactivity disorder)   ? Asthma   ? Cancer [facility])   ? breast ca  ? Depression   ? Family history of breast cancer   ? Family history of leukemia   ? Family history of lung cancer   ? Family history of pancreatic cancer   ? ?Past Surgical History:  ?Procedure Laterality Date  ? BREAST RECONSTRUCTION WITH PLACEMENT OF TISSUE EXPANDER AND ALLODERM Left 06/04/2021  ? Procedure: BREAST RECONSTRUCTION WITH PLACEMENT OF TISSUE EXPANDER AND ALLODERM;  Surgeon: Irene Limbo, MD;  Location: [place];  Service: Plastics;  Laterality: Left;  ? MASTECTOMY WITH RADIOACTIVE SEED GUIDED EXCISION AND AXILLARY SENTINEL LYMPH NODE BIOPSY Left 06/04/2021  ? Procedure: LEFT MASTECTOMY, LEFT AXILLARY SENTINEL LYMPH NODE BIOPSY, LEFT AXILLARY NODE SEED GUIDED EXCISION;  Surgeon: Rolm Bookbinder, MD;  Location: [place];  Service: General;  Laterality: Left;  ? PORTACATH PLACEMENT Right 01/07/2021  ? Procedure: INSERTION PORT-A-CATH;  Surgeon: Rolm Bookbinder, MD;  Location: [place];  Service: General;  Laterality: Right;  ? TUBAL LIGATION    ? ?Patient Active Problem List  ? Diagnosis Date Noted  ? S/P mastectomy, left 06/04/2021  ? Genetic testing 01/18/2021  ? Port-A-Cath in place 01/15/2021  ? Family history of breast cancer   ? Family history of lung cancer   ?  Family history of pancreatic cancer   ? Family history of leukemia   ? Malignant neoplasm of upper-outer quadrant of left breast in female, estrogen receptor negative ([place]) 12/24/2020  ? Benign paroxysmal positional vertigo of left ear 11/15/2018  ? ADD (attention deficit disorder) 08/11/2011  ? ? ?REFERRING DIAG: Left arm pain/heaviness/numbness ? ?THERAPY DIAG:Left arm numbness ?  ?Aftercare following surgery for neoplasm ?  ?Muscle weakness (generalized) ?  ?Stiffness of left shoulder, not elsewhere classified  ? ? ?PERTINENT HISTORY: Pt had left mastectomy with immediate expander fon 06/04/2021. 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 She had neoadjuvant chemo followed by surgery and radiation which ended in November 2022 ? ?PRECAUTIONS: lymphedema risk ? ?SUBJECTIVE:  I just noticed some increased pulling at my inner upper arm yesterday. Not sure what flared it up.  ? ?PAIN:  ?Are you having pain? PAIN:  ?Are you having pain? Yes ?NPRS scale: 5/10 ?Pain location: Lt arm  ?Pain orientation: Medial and Upper  ?PAIN TYPE: intermittent ?Pain description:  pulling   ?Aggravating factors: not sure, just started yesterday ?Relieving factors: stretching  ? ? ?OBJECTIVE ?  ?  ?POSTURE: round shoulders, forward head ?  ?UPPER EXTREMITY AROM/PROM: ?  ?A/PROM RIGHT  12/18/2021 ?   ?Shoulder extension 70  ?Shoulder flexion 167  ?Shoulder abduction 180  ?  Shoulder internal rotation 70  ?Shoulder external rotation 112  ?                        (Blank rows = not tested) ?  ?A/PROM LEFT  12/18/2021 Left 12/30/2021  ?Shoulder extension 55 59  ?Shoulder flexion 150 167  ?Shoulder abduction 134 169  ?Shoulder internal rotation 50 65  ?Shoulder external rotation 95 103  ?                        (Blank rows = not tested) ?  ?  ?  ?  ?  ?UPPER EXTREMITY STRENGTH: Grip right 52, left 42 ?  ?  ? ?LYMPHEDEMA ASSESSMENTS:  ?  ?LANDMARK RIGHT  12/18/2021  ?10 cm proximal to olecranon  process 31.3  ?Olecranon process 23.7  ?10 cm proximal to ulnar styloid process 20.6  ?Just proximal to ulnar styloid process 15.5  ?Across hand at thumb web space 20.2  ?At base of 2nd digit 6.1  ?(Blank rows = not tested) ?  ?[place] LEFT  12/18/2021  ?10 cm proximal to olecranon process 31.6  ?Olecranon process 24  ?10 cm proximal to ulnar styloid process 20.7  ?Just proximal to ulnar styloid process 15.2  ?Across hand at thumb web space 20.8  ?At base of 2nd digit 6.3  ? ?  ?  ?  ?   ?  ?  ?QUICK DASH SURVEY: 41% ?  ?  ?TODAY'S TREATMENT ?01/13/2022: ?Therapeutic Exs: ?Pulleys: Flexion and abd x2 mins each returning therapist demo, pt reports some pain at medial upper arm initially but this improved with the stretching ?Roll ball up wall: flexion x10 reps , also some discomfort reported initially that improved with cont'd reps ?Manual Therapy: ?STM: In Supine to Lt medial upper arm where pt reports increased pain since yesterday and palpable trigger points were present, good trigger point release noted by end of session though and pt with much less tenderness ?P/ROM: In Supine to Lt shoulder into flexion, abd and D2 to increase stretch to areas of reported discomfort ? ?01/08/2022 ? ?Soft tissue mobilization to left UT, Levator,pecs, lats and lower ribs and in SL to lats ?            and scapular area with cocoabutter. PROM  to Left  shoulder flexion, scaption, abduction, ER. ?Supine wand flexion and scaption x5 ?Supine AROM bilateral flexion, scaption and horizontal abduction ?Supine scapular series with yellow band x 10 reps except diagonals x 6 ?Lower trunk rotation to the right with hands in goal post position x 5 ? ?01/06/2022 ?Pulleys 2 min flex and abd, ball rolls x 10 flexion, x 5 abd ?           Soft tissue mobilization to left UT, Levator,pecs, lats and lower ribs and in SL to lats  ?            and scapular area with cocoabutter. PROM  to Left  shoulder flexion, scaption, abduction, ER. ?Supine scapular  series with yellow band x 5 ? ? ?  ?  ?HOME EXERCISE PROGRAM: ?Pectoral wall/doorway stretch, counter lat stretch, abduction wall slide, clasped hand flexion x 5 2x's per day, trunk rotation to left ?  ?ASSESSMENT: ?  ?CLINICAL IMPRESSION: ?Continued manual therapy to Lt upper quadrant today.Focused on medial upper arm where pt reports increased pain/discomfort since yesterday. Multiple trigger points released here and improved end P/ROM by end  of session. Pt also reports her Lt arm feeling much better in general at end of session.  ? ? ?OBJECTIVE IMPAIRMENTS decreased activity tolerance, decreased ROM, decreased strength, increased fascial restrictions, increased muscle spasms, impaired flexibility, impaired UE functional use, postural dysfunction, and pain.  ?  ?ACTIVITY LIMITATIONS cleaning, occupation, yard work, and reaching activities .  ?  ?PERSONAL FACTORS 1-2 comorbidities: breast cancer with chemo and radiation therapy  are also affecting patient's functional outcome.  ?  ?  ?REHAB POTENTIAL: Good ?  ?CLINICAL DECISION MAKING: Stable/uncomplicated ?  ?EVALUATION COMPLEXITY: Low ?  ?GOALS: ?Goals reviewed with patient? Yes ?  ?SHORT TERM GOALS: ?  ?Pt will be independent and compliant with HEP for Left shoulder ROM and strengthening ?Baseline:  ?Target date: 01/08/2022 ?Goal status: MET ?  ?2.  Pt will have L shoulder flexion atleast 160 degrees ?Baseline: 150, 167 today ?Target date: 01/29/2022 ?Goal status: MET 12/30/2021 ?  ?  ?  ?LONG TERM GOALS: ?  ?Pt will have left shoulder aduction atleast 165 degrees ?Baseline: 130 ?Target date: 01/29/2022 ?Goal status: INITIAL ?  ?2.  Quick dash will be no greater than 15% to demonstrate improvement in function ?Baseline: 41 ?Target date: 01/29/2022 ?Goal status: INITIAL ?  ?3.  Pt will report pain improved/heaviness improved by 50% OR GREATER ?Baseline: 40 % better 01/08/2022 ?Target date: 01/29/2022 ?Goal status: INITIAL ?  ?  ?  ?PLAN: ?PT FREQUENCY: 2x/week ?  ?PT  DURATION: 6 weeks ?  ?PLANNED INTERVENTIONS: Therapeutic exercises, Therapeutic activity, Neuromuscular re-education, Patient/Family education, Joint mobilization, Orthotic/Fit training, and Manual thera

## 2022-01-16 ENCOUNTER — Telehealth: Payer: Self-pay | Admitting: *Deleted

## 2022-01-16 NOTE — Telephone Encounter (Signed)
Received call from pt with complaint of acid reflux and GI upset x 2 weeks.  Per MD pt needing to take OTC Pepcid 20 mg p.o BID x 1 week and then once a day until the completion of treatment. Pt educated and verbalized understanding.  ?

## 2022-01-16 NOTE — Progress Notes (Signed)
? ?Patient Care Team: ?London Pepper, MD as PCP - General (Family Medicine) ?Mauro Kaufmann, RN as Oncology Nurse Navigator ?Rockwell Germany, RN as Oncology Nurse Navigator ?Rolm Bookbinder, MD as Consulting Physician (General Surgery) ?Nicholas Lose, MD as Consulting Physician (Hematology and Oncology) ?Eppie Gibson, MD as Attending Physician (Radiation Oncology) ? ?DIAGNOSIS:  ?Encounter Diagnosis  ?Name Primary?  ? Malignant neoplasm of upper-outer quadrant of left breast in female, estrogen receptor negative ([place])   ? ? ?SUMMARY OF ONCOLOGIC HISTORY: ?Oncology History  ?Malignant neoplasm of upper-outer quadrant of left breast in female, estrogen receptor negative ([place])  ?12/19/2020 Initial Diagnosis  ? Patient palpated a left breast mass and noted left axilla tenderness for one week. Diagnostic mammogram and US showed a conglomeration of masses in the left breast at the 2 o'clock position 3-4cm from the nipple spanning 3.9cm total, with a 0.7cm mass at the 2 o'clock position 6cm from the nipple, and two abnormal axillary lymph nodes. 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 ?  ?12/26/2020 Cancer Staging  ? Staging form: Breast, AJCC 8th Edition ?- Clinical stage from 12/26/2020: Stage IIIA (cT3, cN1, cM0, G3, ER-, PR-, HER2+) - Signed by Nicholas Lose, MD on 12/26/2020 ?Stage prefix: Initial diagnosis ?Histologic grading system: 3 grade system ? ?  ?01/08/2021 - 05/14/2021 Chemotherapy  ? TCHP x6 cycles followed by Herceptin Perjeta maintenance ? ? ?  ? ?  ?01/18/2021 Genetic Testing  ? Negative genetic testing:  No pathogenic variants detected on the [employer] CancerNext-Expanded + RNAinsight panel. The report date is 01/18/2021.  ? ?The CancerNext-Expanded + RNAinsight gene panel offered by [employer] and includes sequencing and rearrangement analysis for the following 77 genes: AIP, ALK, APC, ATM, AXIN2, BAP1, BARD1, BLM, BMPR1A, BRCA1, BRCA2,  BRIP1, CDC73, CDH1, CDK4, CDKN1B, CDKN2A, CHEK2, CTNNA1, DICER1, FANCC, FH, FLCN, GALNT12, KIF1B, LZTR1, MAX, MEN1, MET, MLH1, MSH2, MSH3, MSH6, MUTYH, NBN, NF1, NF2, NTHL1, PALB2, PHOX2B, PMS2, POT1, PRKAR1A, PTCH1, PTEN, RAD51C, RAD51D, RB1, RECQL, RET, SDHA, SDHAF2, SDHB, SDHC, SDHD, SMAD4, SMARCA4, SMARCB1, SMARCE1, STK11, SUFU, TMEM127, TP53, TSC1, TSC2, VHL and XRCC2 (sequencing and deletion/duplication); EGFR, EGLN1, HOXB13, KIT, MITF, PDGFRA, POLD1 and POLE (sequencing only); EPCAM and GREM1 (deletion/duplication only). RNA data is routinely analyzed for use in variant interpretation for all genes. ?  ?06/22/2021 -  Chemotherapy  ? Patient is on Treatment Plan : BREAST Trastuzumab + Pertuzumab q21d  ? ?  ?  ?07/16/2021 - 08/22/2021 Radiation Therapy  ? Adjuvant radiation ?  ? ? ?CHIEF COMPLIANT: Follow up breast cancer ? ?INTERVAL HISTORY: Sirena Riddle is a 51 y.o. with above-mentioned history of breast cancer currently on neoadjuvant chemotherapy with Herceptin and Perjeta. She presents to the clinic today for follow-up and toxicity check. She states that everything was going well. She has her reconstruction date for 5/1. ?She has completed her treatments and is super happy that she is done with all of her therapy.  She is recovering very well from the side effects of initial treatment. ? ?ALLERGIES:  is allergic to latex. ? ?MEDICATIONS:  ?Current Outpatient Medications  ?Medication Sig Dispense Refill  ? acetaminophen (TYLENOL) 650 MG CR tablet Take 650 mg by mouth every 8 (eight) hours as needed for pain.    ? albuterol (VENTOLIN HFA) 108 (90 Base) MCG/ACT inhaler Inhale 2 puffs into the lungs every 6 (six) hours as needed for wheezing or shortness of breath. 1 each 1  ? amphetamine-dextroamphetamine (  ADDERALL XR) 20 MG 24 hr capsule Take 20 mg by mouth daily as needed.    ? ergocalciferol (VITAMIN D2) 1.25 MG (50000 UT) capsule Take 1 capsule (50,000 Units total) by mouth once a week. 12 capsule 3  ?  lidocaine-prilocaine (EMLA) cream Apply 1 application topically as needed. 30 g 0  ? Menaquinone-7 (VITAMIN K2) 40 MCG TABS Take 2 tablets by mouth daily. 80 mcg daily total    ? ondansetron (ZOFRAN) 8 MG tablet Take 1 tablet (8 mg total) by mouth every 8 (eight) hours as needed for nausea or vomiting. 20 tablet 0  ? VITAMIN D PO Take 5,000 Int'l Units by mouth every other day.    ? ?No current facility-administered medications for this visit.  ? ? ?PHYSICAL EXAMINATION: ?ECOG PERFORMANCE STATUS: 1 - Symptomatic but completely ambulatory ? ?Vitals:  ? 01/28/22 1216  ?BP: (!) 143/105  ?Pulse: 84  ?Resp: 18  ?Temp: (!) 97.2 ?F (36.2 ?C)  ?SpO2: 100%  ? ?Filed Weights  ? 01/28/22 1216  ?Weight: 181 lb 14.4 oz (82.5 kg)  ? ?  ? ?LABORATORY DATA:  ?I have reviewed the data as listed ? ?  Latest Ref Rng & Units 12/27/2021  ?  8:31 AM 12/06/2021  ?  9:24 AM 11/15/2021  ?  8:59 AM  ?CMP  ?Glucose 70 - 99 mg/dL 227   116   124    ?BUN 6 - 20 mg/dL 18   15   16     ?Creatinine 0.44 - 1.00 mg/dL 0.96   0.84   0.80    ?Sodium 135 - 145 mmol/L 139   141   140    ?Potassium 3.5 - 5.1 mmol/L 3.8   3.9   4.0    ?Chloride 98 - 111 mmol/L 106   107   107    ?CO2 22 - 32 mmol/L 26   27   27     ?Calcium 8.9 - 10.3 mg/dL 9.5   9.4   9.3    ?Total Protein 6.5 - 8.1 g/dL 7.3   7.2   7.0    ?Total Bilirubin 0.3 - 1.2 mg/dL 0.3   0.3   0.2    ?Alkaline Phos 38 - 126 U/L 74   59   59    ?AST 15 - 41 U/L 14   14   13     ?ALT 0 - 44 U/L 14   16   13     ? ? ?Lab Results  ?Component Value Date  ? WBC 5.2 12/27/2021  ? HGB 12.1 12/27/2021  ? HCT 37.9 12/27/2021  ? MCV 87.9 12/27/2021  ? PLT 319 12/27/2021  ? NEUTROABS 3.5 12/27/2021  ? ? ?ASSESSMENT & PLAN:  ?Malignant neoplasm of upper-outer quadrant of left breast in female, estrogen receptor negative ([place]) ?12/19/2020: Palpable left breast mass and left axillary tenderness.  Mammogram revealed conglomeration of masses 3.9 cm and an adjacent 0.7 cm mass and 2 axillary lymph nodes that were  abnormal. ?Biopsy revealed IDC with DCIS, grade 3, ER/PR negative, HER-2 +3+ by IHC ?  ?12/24/2020: Breast MRI revealed mass or non-mass enhancement measuring 9.5 cm and 3 abnormal lymph nodes ?01-02-21: Bone scan: No bone mets ?01/01/2021: CT CAP: No distant metastatic disease ?  ?Treatment Plan: ?1. Neoadjuvant chemotherapy with Adwolf Perjeta ?6 cycles followed by Herceptin Perjeta maintenance completed 01/17/2022 ?2. left mastectomy 06/04/2021: Dr. Donne Hazel: Complete pathologic response, no residual cancer, 0/1 lymph node negative ?3. Followed by  adjuvant radiation therapy completed 08/22/2021 ?-------------------------------------------------------------------------------------------------------------------------------- ?Send a prescription for vitamin D 50,000 units ?COVID infection January 2023: Recovered ? ?Right breast mammogram 02/03/2022 ?She has an appointment for  DIEP flap at [facility] for the left breast in May. ?I would like to see her in 6 months for follow-up and after that she can be seen annually ? ? ? ?No orders of the defined types were placed in this encounter. ? ?The patient has a good understanding of the overall plan. she agrees with it. she will call with any problems that may develop before the next visit here. ?Total time spent: 30 mins including face to face time and time spent for planning, charting and co-ordination of care ? ? Harriette Ohara, MD ?01/28/22 ? ? ? I Gardiner Coins am scribing for Dr. Lindi Adie ? ?I have reviewed the above documentation for accuracy and completeness, and I agree with the above. ?  ?

## 2022-01-17 ENCOUNTER — Encounter: Payer: Self-pay | Admitting: *Deleted

## 2022-01-17 ENCOUNTER — Other Ambulatory Visit: Payer: Self-pay | Admitting: *Deleted

## 2022-01-17 ENCOUNTER — Inpatient Hospital Stay: Payer: Managed Care, Other (non HMO) | Attending: Hematology and Oncology

## 2022-01-17 ENCOUNTER — Other Ambulatory Visit: Payer: Self-pay

## 2022-01-17 ENCOUNTER — Other Ambulatory Visit: Payer: 59

## 2022-01-17 VITALS — BP 134/92 | HR 80 | Temp 98.4°F | Resp 17 | Wt 182.0 lb

## 2022-01-17 DIAGNOSIS — Z171 Estrogen receptor negative status [ER-]: Secondary | ICD-10-CM | POA: Diagnosis not present

## 2022-01-17 DIAGNOSIS — C50412 Malignant neoplasm of upper-outer quadrant of left female breast: Secondary | ICD-10-CM | POA: Insufficient documentation

## 2022-01-17 DIAGNOSIS — Z5112 Encounter for antineoplastic immunotherapy: Secondary | ICD-10-CM | POA: Insufficient documentation

## 2022-01-17 DIAGNOSIS — Z79899 Other long term (current) drug therapy: Secondary | ICD-10-CM | POA: Diagnosis not present

## 2022-01-17 MED ORDER — ONDANSETRON HCL 8 MG PO TABS: 8.0000 mg | ORAL_TABLET | Freq: Three times a day (TID) | ORAL | 0 refills | Status: DC | PRN

## 2022-01-17 MED ORDER — DIPHENHYDRAMINE HCL 25 MG PO CAPS
25.0000 mg | ORAL_CAPSULE | Freq: Once | ORAL | Status: AC
  Administered 2022-01-17: 25 mg via ORAL
  Filled 2022-01-17: qty 1

## 2022-01-17 MED ORDER — SODIUM CHLORIDE 0.9 % IV SOLN: Freq: Once | INTRAVENOUS | Status: AC

## 2022-01-17 MED ORDER — ACETAMINOPHEN 325 MG PO TABS
650.0000 mg | ORAL_TABLET | Freq: Once | ORAL | Status: AC
  Administered 2022-01-17: 650 mg via ORAL
  Filled 2022-01-17: qty 2

## 2022-01-17 MED ORDER — SODIUM CHLORIDE 0.9% FLUSH
10.0000 mL | INTRAVENOUS | Status: DC | PRN
  Administered 2022-01-17: 10 mL

## 2022-01-17 MED ORDER — HEPARIN SOD (PORK) LOCK FLUSH 100 UNIT/ML IV SOLN
500.0000 [IU] | Freq: Once | INTRAVENOUS | Status: AC | PRN
  Administered 2022-01-17: 500 [IU]

## 2022-01-17 MED ORDER — TRASTUZUMAB-ANNS CHEMO 150 MG IV SOLR
6.0000 mg/kg | Freq: Once | INTRAVENOUS | Status: AC
  Administered 2022-01-17: 462 mg via INTRAVENOUS
  Filled 2022-01-17: qty 22

## 2022-01-17 MED ORDER — SODIUM CHLORIDE 0.9 % IV SOLN
420.0000 mg | Freq: Once | INTRAVENOUS | Status: AC
  Administered 2022-01-17: 420 mg via INTRAVENOUS
  Filled 2022-01-17: qty 14

## 2022-01-20 ENCOUNTER — Ambulatory Visit: Payer: Managed Care, Other (non HMO) | Admitting: Physical Therapy

## 2022-01-22 ENCOUNTER — Encounter: Payer: Managed Care, Other (non HMO) | Admitting: Physical Therapy

## 2022-01-27 ENCOUNTER — Ambulatory Visit: Payer: Managed Care, Other (non HMO)

## 2022-01-27 DIAGNOSIS — Z483 Aftercare following surgery for neoplasm: Secondary | ICD-10-CM

## 2022-01-27 DIAGNOSIS — M25612 Stiffness of left shoulder, not elsewhere classified: Secondary | ICD-10-CM

## 2022-01-27 DIAGNOSIS — R200 Anesthesia of skin: Secondary | ICD-10-CM

## 2022-01-27 DIAGNOSIS — M6281 Muscle weakness (generalized): Secondary | ICD-10-CM

## 2022-01-27 DIAGNOSIS — C50412 Malignant neoplasm of upper-outer quadrant of left female breast: Secondary | ICD-10-CM

## 2022-01-27 NOTE — Therapy (Signed)
?OUTPATIENT PHYSICAL THERAPY TREATMENT NOTE ? ? ?Patient Name: Maria Kennedy ?MRN: [id number] ?DOB:1971-01-27, 51 y.o., female ?Today's Date: 01/27/2022 ? ?PCP: London Pepper, MD ?REFERRING PROVIDER: Nicholas Lose, MD ? ? PT End of Session - 01/27/22 1612   ? ? Visit Number 7   ? Number of Visits 12   ? Date for PT Re-Evaluation 01/29/22   ? PT Start Time 6606   ? PT Stop Time 3016   ? PT Time Calculation (min) 57 min   ? Activity Tolerance Patient tolerated treatment well   ? Behavior During Therapy [facility] for tasks assessed/performed   ? ?  ?  ? ?  ? ? ?Past Medical History:  ?Diagnosis Date  ? ADHD (attention deficit hyperactivity disorder)   ? Asthma   ? Cancer [facility])   ? breast ca  ? Depression   ? Family history of breast cancer   ? Family history of leukemia   ? Family history of lung cancer   ? Family history of pancreatic cancer   ? ?Past Surgical History:  ?Procedure Laterality Date  ? BREAST RECONSTRUCTION WITH PLACEMENT OF TISSUE EXPANDER AND ALLODERM Left 06/04/2021  ? Procedure: BREAST RECONSTRUCTION WITH PLACEMENT OF TISSUE EXPANDER AND ALLODERM;  Surgeon: Irene Limbo, MD;  Location: [place];  Service: Plastics;  Laterality: Left;  ? MASTECTOMY WITH RADIOACTIVE SEED GUIDED EXCISION AND AXILLARY SENTINEL LYMPH NODE BIOPSY Left 06/04/2021  ? Procedure: LEFT MASTECTOMY, LEFT AXILLARY SENTINEL LYMPH NODE BIOPSY, LEFT AXILLARY NODE SEED GUIDED EXCISION;  Surgeon: Rolm Bookbinder, MD;  Location: [place];  Service: General;  Laterality: Left;  ? PORTACATH PLACEMENT Right 01/07/2021  ? Procedure: INSERTION PORT-A-CATH;  Surgeon: Rolm Bookbinder, MD;  Location: [place];  Service: General;  Laterality: Right;  ? TUBAL LIGATION    ? ?Patient Active Problem List  ? Diagnosis Date Noted  ? S/P mastectomy, left 06/04/2021  ? Genetic testing 01/18/2021  ? Port-A-Cath in place 01/15/2021  ? Family history of breast cancer   ? Family history of lung cancer   ?  Family history of pancreatic cancer   ? Family history of leukemia   ? Malignant neoplasm of upper-outer quadrant of left breast in female, estrogen receptor negative ([place]) 12/24/2020  ? Benign paroxysmal positional vertigo of left ear 11/15/2018  ? ADD (attention deficit disorder) 08/11/2011  ? ? ?REFERRING DIAG: Left arm pain/heaviness/numbness ? ?THERAPY DIAG:Left arm numbness ?  ?Aftercare following surgery for neoplasm ?  ?Muscle weakness (generalized) ?  ?Stiffness of left shoulder, not elsewhere classified  ? ? ?PERTINENT HISTORY: Pt had left mastectomy with immediate expander fon 06/04/2021. 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 She had neoadjuvant chemo followed by surgery and radiation which ended in November 2022 ? ?PRECAUTIONS: lymphedema risk ? ?SUBJECTIVE:  The increased pulling in my Lt upper arm is still there and about the same. It did feel better after you worked on it last time though. Also started noticing a little pull under my breast that feels thick.  ? ?PAIN:  ?Are you having pain? No ? ? ?OBJECTIVE ?  ?  ?POSTURE: round shoulders, forward head ?  ?UPPER EXTREMITY AROM/PROM: ?  ?A/PROM RIGHT  12/18/2021 ?   ?Shoulder extension 70  ?Shoulder flexion 167  ?Shoulder abduction 180  ?Shoulder internal rotation 70  ?Shoulder external rotation 112  ?                        (  Blank rows = not tested) ?  ?A/PROM LEFT  12/18/2021 Left 12/30/2021 Left 01/27/22  ?Shoulder extension 55 59   ?Shoulder flexion 150 167 174  ?Shoulder abduction 134 169 177  ?Shoulder internal rotation 50 65 71  ?Shoulder external rotation 95 103   ?                        (Blank rows = not tested) ?  ?  ?  ?  ?  ?UPPER EXTREMITY STRENGTH: 12/18/21:Grip right 52, left 42; 01/27/22: left 57 ?  ?  ? ?LYMPHEDEMA ASSESSMENTS:  ?  ?LANDMARK RIGHT  12/18/2021  ?10 cm proximal to olecranon process 31.3  ?Olecranon process 23.7  ?10 cm proximal to ulnar styloid process 20.6  ?Just  proximal to ulnar styloid process 15.5  ?Across hand at thumb web space 20.2  ?At base of 2nd digit 6.1  ?(Blank rows = not tested) ?  ?[place] LEFT  12/18/2021  ?10 cm proximal to olecranon process 31.6  ?Olecranon process 24  ?10 cm proximal to ulnar styloid process 20.7  ?Just proximal to ulnar styloid process 15.2  ?Across hand at thumb web space 20.8  ?At base of 2nd digit 6.3  ? ?  ?  ?  ?   ?  ?  ?QUICK DASH SURVEY: 41% ?  ?  ?TODAY'S TREATMENT ?01/27/22: ?Therapeutic Exs: ?Pulleys: Flexion and abd x2 mins each returning therapist demo, pt reports some pain at medial upper arm initially but this improved with the stretching ?Roll ball up wall: flexion x10 reps and abd x5 ?Bil UE raises with 1#, 1x10 each returning therapist demo for each and having head/shoulder/back against wall and core engaged. Pt required multiple VCs to keep back against wall as she has increased lumbar lordosis and this was challenging for her ? Manual Therapy: ?MFR: In Supine to Lt medial upper arm to antecubital fossa where cording palpable and 1 pop felt during session, also inferior to Lt breast where pt reporting feeling pulling and 1 deep cord palpated (briefly had pt perform Rt trunk rotation to increase stretch to inferior to breast myofascia) ?P/ROM: In Supine to Lt shoulder into flexion, abd and D2 with scapular depression throughout ? ?01/13/2022: ?Therapeutic Exs: ?Pulleys: Flexion and abd x2 mins each returning therapist demo, pt reports some pain at medial upper arm initially but this improved with the stretching ?Roll ball up wall: flexion x10 reps and abd x5  ?Manual Therapy: ?STM: In Supine to Lt medial upper arm where pt reports increased pain since yesterday and palpable trigger points were present, good trigger point release noted by end of session though and pt with much less tenderness ?P/ROM: In Supine to Lt shoulder into flexion, abd and D2 to increase stretch to areas of reported discomfort ? ?01/08/2022 ? ?Soft  tissue mobilization to left UT, Levator,pecs, lats and lower ribs and in SL to lats ?            and scapular area with cocoabutter. PROM  to Left  shoulder flexion, scaption, abduction, ER. ?Supine wand flexion and scaption x5 ?Supine AROM bilateral flexion, scaption and horizontal abduction ?Supine scapular series with yellow band x 10 reps except diagonals x 6 ?Lower trunk rotation to the right with hands in goal post position x 5 ? ? ?  ?  ? PATIENT EDUCATION: ?Education details: Bil UE 3 way raises ?Person educated: Patient ?Education method: Explanation, Demonstration, and Handouts ?Education comprehension: verbalized understanding  and returned demonstration  ?  ?ASSESSMENT: ?  ?CLINICAL IMPRESSION: ?Pt reports the tightness from cording felt better after last session but is feeling tight again since last session. Also has noticed tightness inferior to breast so included MFR here today as well medial upper arm to antecubital fossa where felt 1 cord release. Pts A/ROM and grip strength has improved since start of care. Discussed current functional status and encouraged her to consider D/C or renewal, possibly reducing freq, by next session. Pt able to verbalize good understanding.  Also progressed HEP to include bil UE 3 way raises as she tolerated these well today.  ? ? ?OBJECTIVE IMPAIRMENTS decreased activity tolerance, decreased ROM, decreased strength, increased fascial restrictions, increased muscle spasms, impaired flexibility, impaired UE functional use, postural dysfunction, and pain.  ?  ?ACTIVITY LIMITATIONS cleaning, occupation, yard work, and reaching activities .  ?  ?PERSONAL FACTORS 1-2 comorbidities: breast cancer with chemo and radiation therapy  are also affecting patient's functional outcome.  ?  ?  ?REHAB POTENTIAL: Good ?  ?CLINICAL DECISION MAKING: Stable/uncomplicated ?  ?EVALUATION COMPLEXITY: Low ?  ?GOALS: ?Goals reviewed with patient? Yes ?  ?SHORT TERM GOALS: ?  ?Pt will be  independent and compliant with HEP for Left shoulder ROM and strengthening ?Baseline:  ?Target date: 01/08/2022 ?Goal status: MET ?  ?2.  Pt will have L shoulder flexion atleast 160 degrees ?Baseline: 150, 167 to

## 2022-01-27 NOTE — Patient Instructions (Signed)
3 Way Raises:      Starting Position:  Leaning against wall, walk feet a few inches away from the wall and make tummy tight (tuck hips underneath you) Press back/shoulders/head against wall as much as possible. Keep thumbs up to ceiling, elbows straight and shoulders relaxed/down throughout.  1. Lift arms in front to shoulder height 2. Lift arms a little wider into a "V" to shoulder height 3. Lift arms out to sides in a "T" to shoulder height  Perform 10 times in each direction. Hold 1-2 lbs to start with and work up to 2-3 sets of 10/day. Perform 3-4 times/week. Increase weight as able, decreasing sets of 10 each time you increase weights, then slowly working your way back up to 2-3 sets each time.    Cancer Rehab [phone]     

## 2022-01-28 ENCOUNTER — Inpatient Hospital Stay (HOSPITAL_BASED_OUTPATIENT_CLINIC_OR_DEPARTMENT_OTHER): Payer: Managed Care, Other (non HMO) | Admitting: Hematology and Oncology

## 2022-01-28 ENCOUNTER — Other Ambulatory Visit: Payer: Self-pay

## 2022-01-28 DIAGNOSIS — Z171 Estrogen receptor negative status [ER-]: Secondary | ICD-10-CM | POA: Diagnosis not present

## 2022-01-28 DIAGNOSIS — C50412 Malignant neoplasm of upper-outer quadrant of left female breast: Secondary | ICD-10-CM

## 2022-01-28 DIAGNOSIS — Z5112 Encounter for antineoplastic immunotherapy: Secondary | ICD-10-CM | POA: Diagnosis not present

## 2022-01-28 NOTE — Assessment & Plan Note (Addendum)
12/19/2020: Palpable left breast mass and left axillary tenderness. ?Mammogram revealed conglomeration of masses 3.9 cm and an adjacent 0.7 cm mass and 2 axillary lymph nodes that were abnormal. 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 ?12/24/2020: Breast MRI revealed mass or non-mass enhancement measuring 9.5 cm and 3 abnormal lymph nodes ?01-02-21: Bone scan: No bone mets ?01/01/2021: CT CAP: No distant metastatic disease ?? ?Treatment Plan: ?1. Neoadjuvant chemotherapy with [place] Perjeta ?6 cycles followed by Herceptin Perjeta maintenance completed 01/17/2022 ?2.?left mastectomy 06/04/2021: Dr. Donne Hazel: Complete pathologic response, no residual cancer, 0/1 lymph node negative ?3. 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 ?Right breast mammogram 02/03/2022 ?She has an appointment for  DIEP flap at [facility] for the left breast in May. ?I would like to see her in 6 months for follow-up and after that she can be seen annually ?

## 2022-01-29 ENCOUNTER — Ambulatory Visit: Payer: Managed Care, Other (non HMO)

## 2022-01-29 ENCOUNTER — Telehealth: Payer: Self-pay | Admitting: Hematology and Oncology

## 2022-01-29 ENCOUNTER — Encounter (HOSPITAL_COMMUNITY): Payer: Self-pay

## 2022-01-29 DIAGNOSIS — Z483 Aftercare following surgery for neoplasm: Secondary | ICD-10-CM | POA: Diagnosis not present

## 2022-01-29 DIAGNOSIS — M25612 Stiffness of left shoulder, not elsewhere classified: Secondary | ICD-10-CM

## 2022-01-29 DIAGNOSIS — M6281 Muscle weakness (generalized): Secondary | ICD-10-CM

## 2022-01-29 DIAGNOSIS — R200 Anesthesia of skin: Secondary | ICD-10-CM

## 2022-01-29 DIAGNOSIS — Z171 Estrogen receptor negative status [ER-]: Secondary | ICD-10-CM

## 2022-01-29 NOTE — Telephone Encounter (Signed)
Rescheduled upcoming appointment per 4/18 los. Patient is aware of changes. ?

## 2022-01-29 NOTE — Therapy (Signed)
?OUTPATIENT PHYSICAL THERAPY TREATMENT NOTE ? ? ?Patient Name: Maria Kennedy ?MRN: [id number] ?DOB:1971-02-27, 51 y.o., female ?Today's Date: 01/29/2022 ? ?PCP: London Pepper, MD ?REFERRING PROVIDER: Nicholas Lose, MD ? ? PT End of Session - 01/29/22 1602   ? ? Visit Number 8   ? Number of Visits 12   ? Date for PT Re-Evaluation 01/29/22   ? PT Start Time 7416   ? PT Stop Time 3845   ? PT Time Calculation (min) 49 min   ? Activity Tolerance Patient tolerated treatment well   ? Behavior During Therapy [facility] for tasks assessed/performed   ? ?  ?  ? ?  ? ? ?Past Medical History:  ?Diagnosis Date  ? ADHD (attention deficit hyperactivity disorder)   ? Asthma   ? Cancer [facility])   ? breast ca  ? Depression   ? Family history of breast cancer   ? Family history of leukemia   ? Family history of lung cancer   ? Family history of pancreatic cancer   ? ?Past Surgical History:  ?Procedure Laterality Date  ? BREAST RECONSTRUCTION WITH PLACEMENT OF TISSUE EXPANDER AND ALLODERM Left 06/04/2021  ? Procedure: BREAST RECONSTRUCTION WITH PLACEMENT OF TISSUE EXPANDER AND ALLODERM;  Surgeon: Irene Limbo, MD;  Location: [place];  Service: Plastics;  Laterality: Left;  ? MASTECTOMY WITH RADIOACTIVE SEED GUIDED EXCISION AND AXILLARY SENTINEL LYMPH NODE BIOPSY Left 06/04/2021  ? Procedure: LEFT MASTECTOMY, LEFT AXILLARY SENTINEL LYMPH NODE BIOPSY, LEFT AXILLARY NODE SEED GUIDED EXCISION;  Surgeon: Rolm Bookbinder, MD;  Location: [place];  Service: General;  Laterality: Left;  ? PORTACATH PLACEMENT Right 01/07/2021  ? Procedure: INSERTION PORT-A-CATH;  Surgeon: Rolm Bookbinder, MD;  Location: [place];  Service: General;  Laterality: Right;  ? TUBAL LIGATION    ? ?Patient Active Problem List  ? Diagnosis Date Noted  ? S/P mastectomy, left 06/04/2021  ? Genetic testing 01/18/2021  ? Port-A-Cath in place 01/15/2021  ? Family history of breast cancer   ? Family history of lung cancer   ?  Family history of pancreatic cancer   ? Family history of leukemia   ? Malignant neoplasm of upper-outer quadrant of left breast in female, estrogen receptor negative ([place]) 12/24/2020  ? Benign paroxysmal positional vertigo of left ear 11/15/2018  ? ADD (attention deficit disorder) 08/11/2011  ? ? ?REFERRING DIAG: Left arm pain/heaviness/numbness ? ?THERAPY DIAG:Left arm numbness ?  ?Aftercare following surgery for neoplasm ?  ?Muscle weakness (generalized) ?  ?Stiffness of left shoulder, not elsewhere classified  ? ? ?PERTINENT HISTORY: Pt had left mastectomy with immediate expander fon 06/04/2021. 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 She had neoadjuvant chemo followed by surgery and radiation which ended in November 2022 ? ?PRECAUTIONS: lymphedema risk ? ?Subjective: I think I am doing well and can be released.  Will have reconstruction surgery May 1 st so I may need to come back. A lot of the soreness in my chest and shoulder is better and even the ribcage is better. I still have a little cord in my elbow region but its not bad. ? ?PAIN:  ?Are you having pain? No, just tight in elbow from the cord ? ? ?OBJECTIVE ?  ?  ?POSTURE: round shoulders, forward head ?  ?UPPER EXTREMITY AROM/PROM: ?  ?A/PROM RIGHT  12/18/2021 ?   ?Shoulder extension 70  ?Shoulder flexion 167  ?  Shoulder abduction 180  ?Shoulder internal rotation 70  ?Shoulder external rotation 112  ?                        (Blank rows = not tested) ?  ?A/PROM LEFT  12/18/2021 Left 12/30/2021 Left 01/27/22  ?Shoulder extension 55 59   ?Shoulder flexion 150 167 174  ?Shoulder abduction 134 169 177  ?Shoulder internal rotation 50 65 71  ?Shoulder external rotation 95 103   ?                        (Blank rows = not tested) ?  ?  ?  ?  ?  ?UPPER EXTREMITY STRENGTH: 12/18/21:Grip right 52, left 42; 01/27/22: left 57 ?  ?  ? ?LYMPHEDEMA ASSESSMENTS:  ?  ?LANDMARK RIGHT  12/18/2021  ?10 cm proximal to  olecranon process 31.3  ?Olecranon process 23.7  ?10 cm proximal to ulnar styloid process 20.6  ?Just proximal to ulnar styloid process 15.5  ?Across hand at thumb web space 20.2  ?At base of 2nd digit 6.1  ?(Blank rows = not tested) ?  ?[place] LEFT  12/18/2021  ?10 cm proximal to olecranon process 31.6  ?Olecranon process 24  ?10 cm proximal to ulnar styloid process 20.7  ?Just proximal to ulnar styloid process 15.2  ?Across hand at thumb web space 20.8  ?At base of 2nd digit 6.3  ? ?  ?  ?  ?   ?  ?  ?QUICK DASH SURVEY: 41%, 30% 4/192023 ?  ?  ?TODAY'S TREATMENT ?01/29/2022 ?Pulleys flexion and abd x 37mn, scaption x 1 min ?Ball rolls on wall with end range stretch x 10, abd x 5 ?MFR to left axilla, antecubital fossan and forearm area of cords ?Soft tissue mobilization left pecs, lats and UT , and left lower ribs with cocoa butter. ?Assessed goals ? ? ?01/27/22: ?Therapeutic Exs: ?Pulleys: Flexion and abd x2 mins each returning therapist demo, pt reports some pain at medial upper arm initially but this improved with the stretching ?Roll ball up wall: flexion x10 reps and abd x5 ?Bil UE raises with 1#, 1x10 each returning therapist demo for each and having head/shoulder/back against wall and core engaged. Pt required multiple VCs to keep back against wall as she has increased lumbar lordosis and this was challenging for her ? Manual Therapy: ?MFR: In Supine to Lt medial upper arm to antecubital fossa where cording palpable and 1 pop felt during session, also inferior to Lt breast where pt reporting feeling pulling and 1 deep cord palpated (briefly had pt perform Rt trunk rotation to increase stretch to inferior to breast myofascia) ?P/ROM: In Supine to Lt shoulder into flexion, abd and D2 with scapular depression throughout ? ?01/13/2022: ?Therapeutic Exs: ?Pulleys: Flexion and abd x2 mins each returning therapist demo, pt reports some pain at medial upper arm initially but this improved with the stretching ?Roll ball  up wall: flexion x10 reps and abd x5  ?Manual Therapy: ?STM: In Supine to Lt medial upper arm where pt reports increased pain since yesterday and palpable trigger points were present, good trigger point release noted by end of session though and pt with much less tenderness ?P/ROM: In Supine to Lt shoulder into flexion, abd and D2 to increase stretch to areas of reported discomfort ? ?01/08/2022 ? ?Soft tissue mobilization to left UT, Levator,pecs, lats and lower ribs and in SL to lats ?  and scapular area with cocoabutter. PROM  to Left  shoulder flexion, scaption, abduction, ER. ?Supine wand flexion and scaption x5 ?Supine AROM bilateral flexion, scaption and horizontal abduction ?Supine scapular series with yellow band x 10 reps except diagonals x 6 ?Lower trunk rotation to the right with hands in goal post position x 5 ? ? ?  ?  ? PATIENT EDUCATION: ?Education details: Bil UE 3 way raises ?Person educated: Patient ?Education method: Explanation, Demonstration, and Handouts ?Education comprehension: verbalized understanding and returned demonstration  ?  ?ASSESSMENT: ?  ?CLINICAL IMPRESSION: ?Pt has achieved all goals except her quick dash goal which improved to 30 % today.  Her shoulder ROM is excellent, and she has had an 80% reduction in pain/tightness. She does still have several small cords palpable which cross the elbow, but don't seem to be interfering with her motion.  She will have her reconstruction surgery on 02/10/2022 with a DIEP Flap ? ? ?OBJECTIVE IMPAIRMENTS decreased activity tolerance, decreased ROM, decreased strength, increased fascial restrictions, increased muscle spasms, impaired flexibility, impaired UE functional use, postural dysfunction, and pain.  ?  ?ACTIVITY LIMITATIONS cleaning, occupation, yard work, and reaching activities .  ?  ?PERSONAL FACTORS 1-2 comorbidities: breast cancer with chemo and radiation therapy  are also affecting patient's functional outcome.  ?  ?  ?REHAB  POTENTIAL: Good ?  ?CLINICAL DECISION MAKING: Stable/uncomplicated ?  ?EVALUATION COMPLEXITY: Low ?  ?GOALS: ?Goals reviewed with patient? Yes ?  ?SHORT TERM GOALS: ?  ?Pt will be independent and compliant

## 2022-01-31 ENCOUNTER — Other Ambulatory Visit: Payer: Self-pay | Admitting: Hematology and Oncology

## 2022-01-31 DIAGNOSIS — Z1231 Encounter for screening mammogram for malignant neoplasm of breast: Secondary | ICD-10-CM

## 2022-02-03 ENCOUNTER — Ambulatory Visit
Admission: RE | Admit: 2022-02-03 | Discharge: 2022-02-03 | Disposition: A | Discharge status: Home / Self Care | Payer: Managed Care, Other (non HMO) | Source: Ambulatory Visit | Attending: Hematology and Oncology | Admitting: Hematology and Oncology

## 2022-02-03 DIAGNOSIS — Z1231 Encounter for screening mammogram for malignant neoplasm of breast: Secondary | ICD-10-CM

## 2022-02-04 ENCOUNTER — Other Ambulatory Visit: Payer: Self-pay | Admitting: Hematology and Oncology

## 2022-02-04 ENCOUNTER — Other Ambulatory Visit: Payer: Self-pay | Admitting: *Deleted

## 2022-02-04 DIAGNOSIS — N6310 Unspecified lump in the right breast, unspecified quadrant: Secondary | ICD-10-CM

## 2022-02-07 ENCOUNTER — Encounter: Payer: Self-pay | Admitting: Hematology and Oncology

## 2022-02-07 ENCOUNTER — Ambulatory Visit
Admission: RE | Admit: 2022-02-07 | Discharge: 2022-02-07 | Disposition: A | Discharge status: Home / Self Care | Payer: Managed Care, Other (non HMO) | Source: Ambulatory Visit | Attending: Hematology and Oncology | Admitting: Hematology and Oncology

## 2022-02-07 DIAGNOSIS — N6310 Unspecified lump in the right breast, unspecified quadrant: Secondary | ICD-10-CM

## 2022-02-07 HISTORY — DX: Personal history of irradiation: Z92.3

## 2022-02-07 HISTORY — DX: Personal history of antineoplastic chemotherapy: Z92.21

## 2022-03-17 ENCOUNTER — Ambulatory Visit: Payer: Managed Care, Other (non HMO) | Attending: Plastic Surgery

## 2022-03-17 VITALS — Wt 179.5 lb

## 2022-03-17 DIAGNOSIS — Z483 Aftercare following surgery for neoplasm: Secondary | ICD-10-CM | POA: Insufficient documentation

## 2022-03-17 NOTE — Therapy (Signed)
  OUTPATIENT PHYSICAL THERAPY SOZO SCREENING NOTE   Patient Name: Maria Kennedy MRN: [id number] DOB:[birth year], 51 y.o., female Today's Date: 03/17/2022  PCP: London Pepper, MD REFERRING PROVIDER: Irene Limbo, MD   PT End of Session - 03/17/22 1624     Visit Number 8   # unchanged due to screen only   PT Start Time 1622    PT Stop Time 1627    PT Time Calculation (min) 5 min    Activity Tolerance Patient tolerated treatment well    Behavior During Therapy WFL for tasks assessed/performed             Past Medical History:  Diagnosis Date   ADHD (attention deficit hyperactivity disorder)    Asthma    Cancer ([place])    breast ca   Depression    Family history of breast cancer    Family history of leukemia    Family history of lung cancer    Family history of pancreatic cancer    Personal history of chemotherapy    Personal history of radiation therapy    Past Surgical History:  Procedure Laterality Date   BREAST RECONSTRUCTION WITH PLACEMENT OF TISSUE EXPANDER AND ALLODERM Left 06/04/2021   Procedure: BREAST RECONSTRUCTION WITH PLACEMENT OF TISSUE EXPANDER AND ALLODERM;  Surgeon: Irene Limbo, MD;  Location: [place];  Service: Plastics;  Laterality: Left;   MASTECTOMY WITH RADIOACTIVE SEED GUIDED EXCISION AND AXILLARY SENTINEL LYMPH NODE BIOPSY Left 06/04/2021   Procedure: LEFT MASTECTOMY, LEFT AXILLARY SENTINEL LYMPH NODE BIOPSY, LEFT AXILLARY NODE SEED GUIDED EXCISION;  Surgeon: Rolm Bookbinder, MD;  Location: [place];  Service: General;  Laterality: Left;   PORTACATH PLACEMENT Right 01/07/2021   Procedure: INSERTION PORT-A-CATH;  Surgeon: Rolm Bookbinder, MD;  Location: [place];  Service: General;  Laterality: Right;   TUBAL LIGATION     Patient Active Problem List   Diagnosis Date Noted   S/P mastectomy, left 06/04/2021   Genetic testing 01/18/2021   Port-A-Cath in place 01/15/2021   Family history  of breast cancer    Family history of lung cancer    Family history of pancreatic cancer    Family history of leukemia    Malignant neoplasm of upper-outer quadrant of left breast in female, estrogen receptor negative ([place]) 12/24/2020   Benign paroxysmal positional vertigo of left ear 11/15/2018   ADD (attention deficit disorder) 08/11/2011    REFERRING DIAG: left breast cancer at risk for lymphedema  THERAPY DIAG:  Aftercare following surgery for neoplasm  PERTINENT HISTORY: Pt had left mastectomy with immediate expander fon 06/04/2021. 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 She had neoadjuvant chemo followed by surgery and radiation which ended in November 2022  PRECAUTIONS: left UE Lymphedema risk, None  SUBJECTIVE: Pt returns for her 3 month L-Dex screen.   PAIN:  Are you having pain? No  SOZO SCREENING: Patient was assessed today using the SOZO machine to determine the lymphedema index score. This was compared to her baseline score. It was determined that she is within the recommended range when compared to her baseline and no further action is needed at this time. She will continue SOZO screenings. These are done every 3 months for 2 years post operatively followed by every 6 months for 2 years, and then annually.    Otelia Limes, PTA 03/17/2022, 4:26 PM

## 2022-04-03 ENCOUNTER — Other Ambulatory Visit: Payer: Self-pay | Admitting: Nurse Practitioner

## 2022-04-07 IMAGING — MG MM BREAST LOCALIZATION CLIP
2 series · 3 of 6 positions shown · non-contrast
Comparison: Previous exam(s).

CLINICAL DATA: Status post ultrasound-guided placement of a
radioactive seed into a LEFT axillary lymph node for scheduled
targeted lymph node dissection.

EXAM:
3D DIAGNOSTIC LEFT MAMMOGRAM POST ULTRASOUND-GUIDED RADIOACTIVE SEED
PLACEMENT

[L MLO synth-2D]
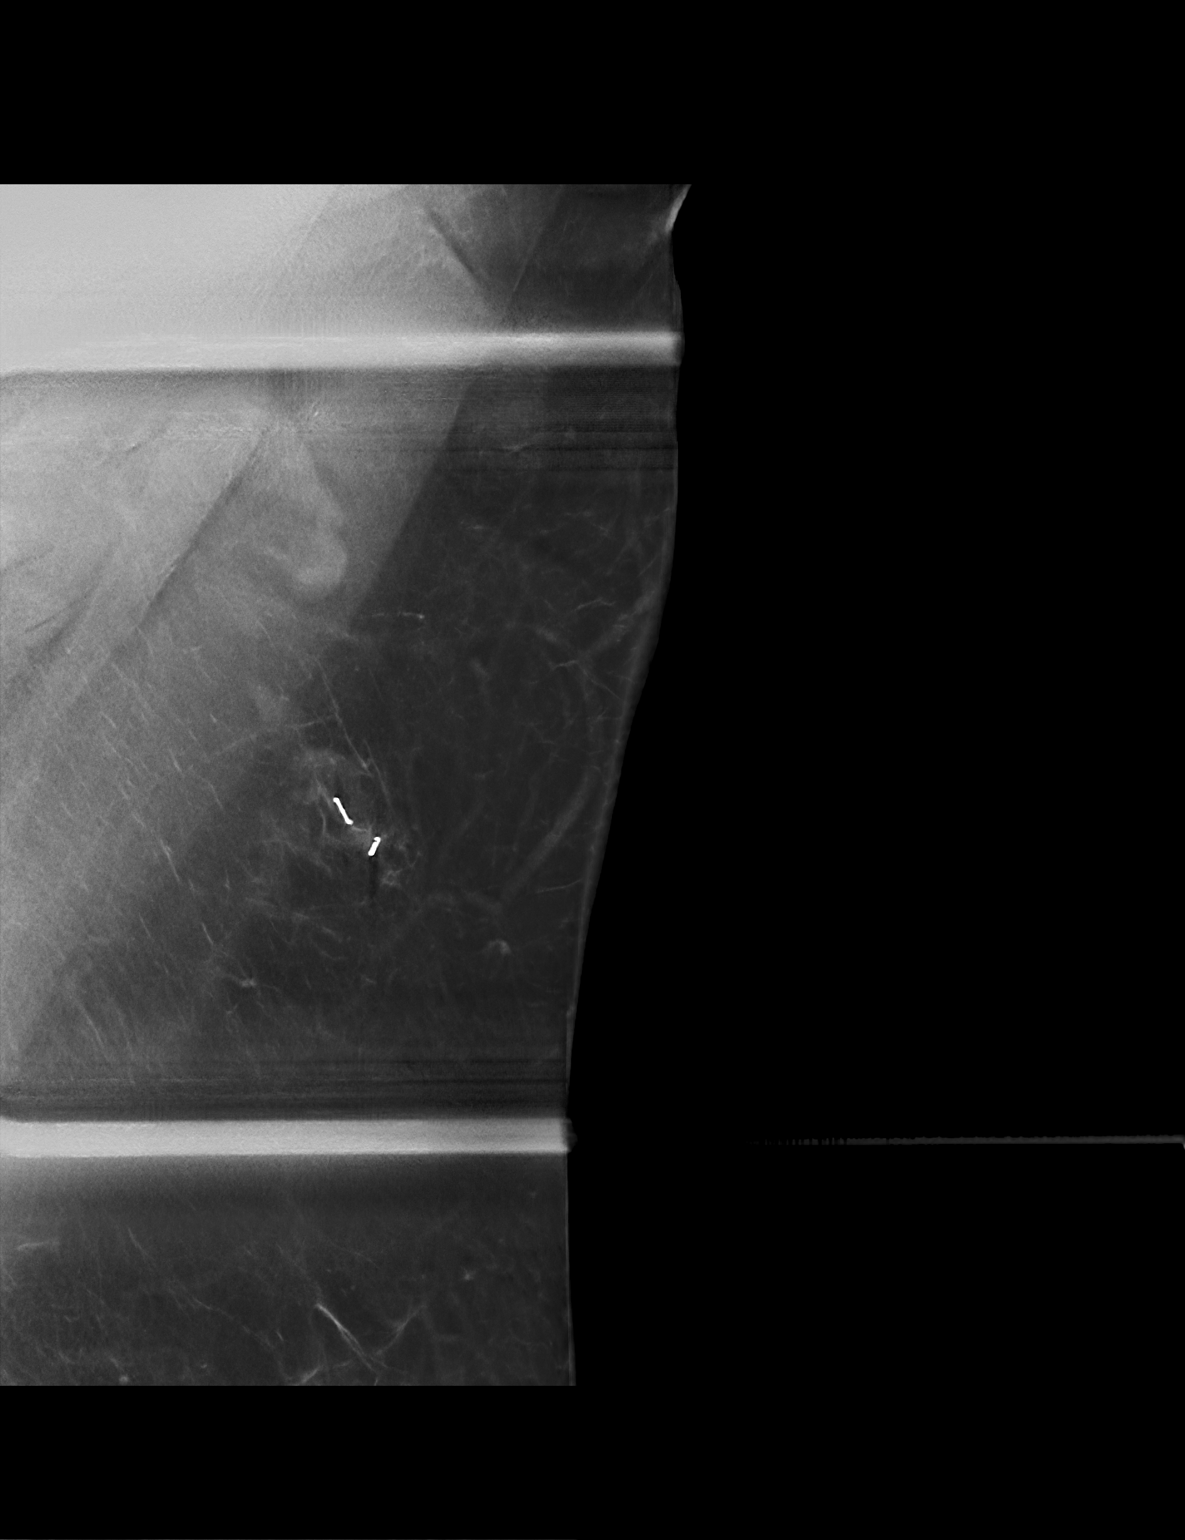

[L MLO tomo · 2 of 111 frames shown]
[frame 36/111]
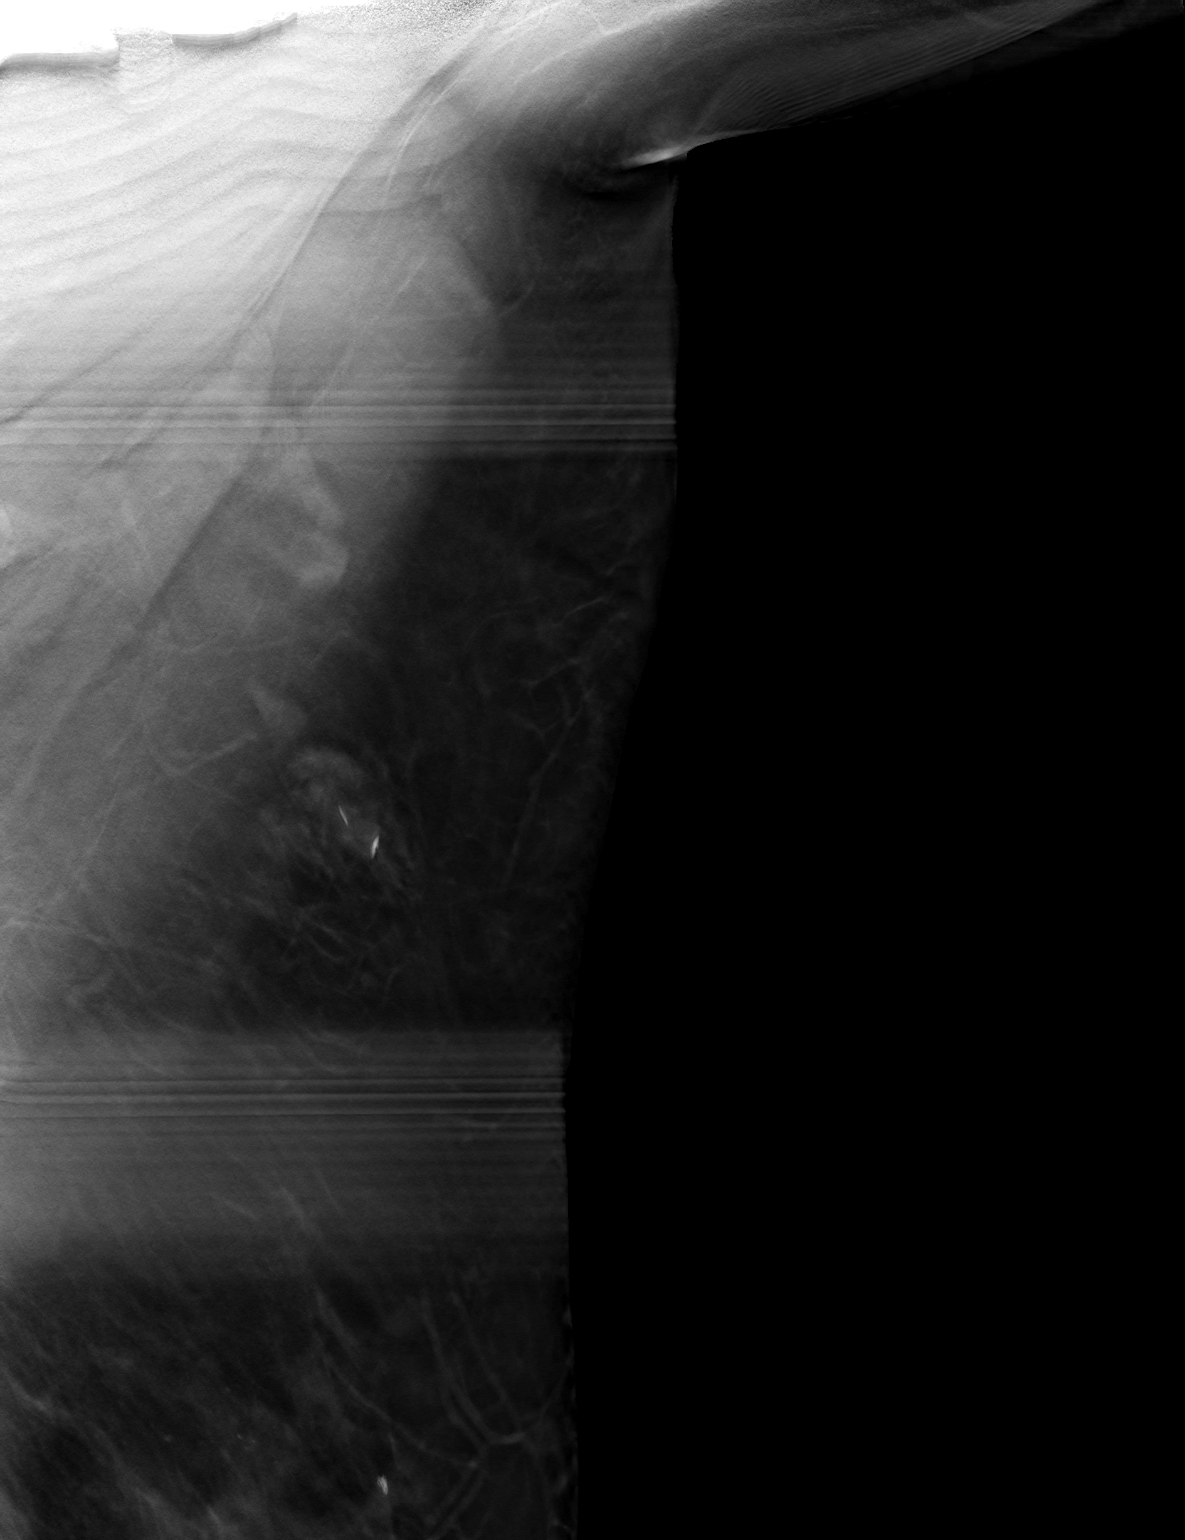
[frame 56/111]
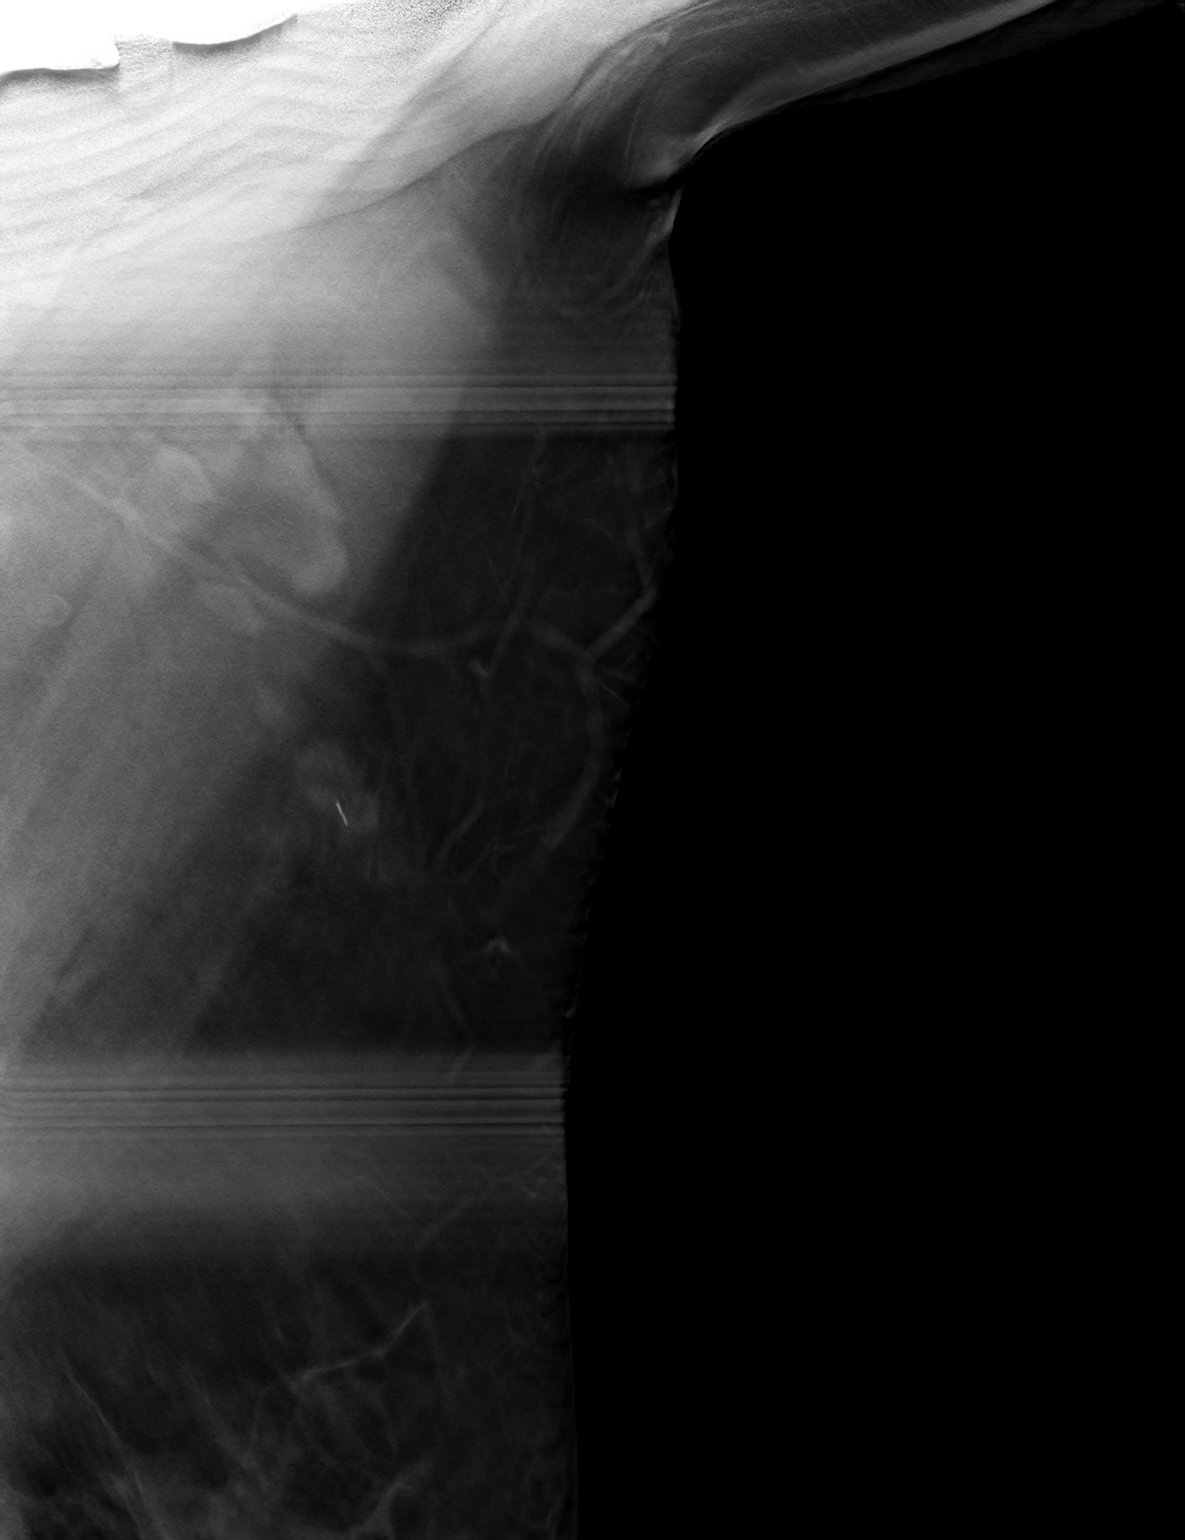

[3 of 6 positions shown; findings below may reference images not displayed]

FINDINGS: 3D Mammographic images were obtained following ultrasound guided
radioactive seed localization of of a LEFT axillary lymph node. The
biopsy marking clip is in expected position at the site of biopsy.
IMPRESSION: Appropriate positioning of the radioactive seed within the
previously biopsied LEFT axillary lymph node that contains a Tribell
biopsy marking clip.

Final Assessment: Post Procedure Mammograms for Marker Placement

## 2022-04-07 IMAGING — US US PLC BREAST LOC DEV 1ST LESION INC US GUIDE*L*
1 series · 1 of 1 positions shown · non-contrast
Comparison: Previous exam(s).

CLINICAL DATA: Patient is scheduled for LEFT axillary targeted
lymph node dissection requiring preoperative radioactive seed
localization.

EXAM:
ULTRASOUND GUIDED RADIOACTIVE SEED LOCALIZATION OF THE LEFT AXILLA

[Series 1: us plc breast loc dev 1st lesion inc us guide*left · 0.07mm/px · 1 of 1 slices shown]
[im 1/1]
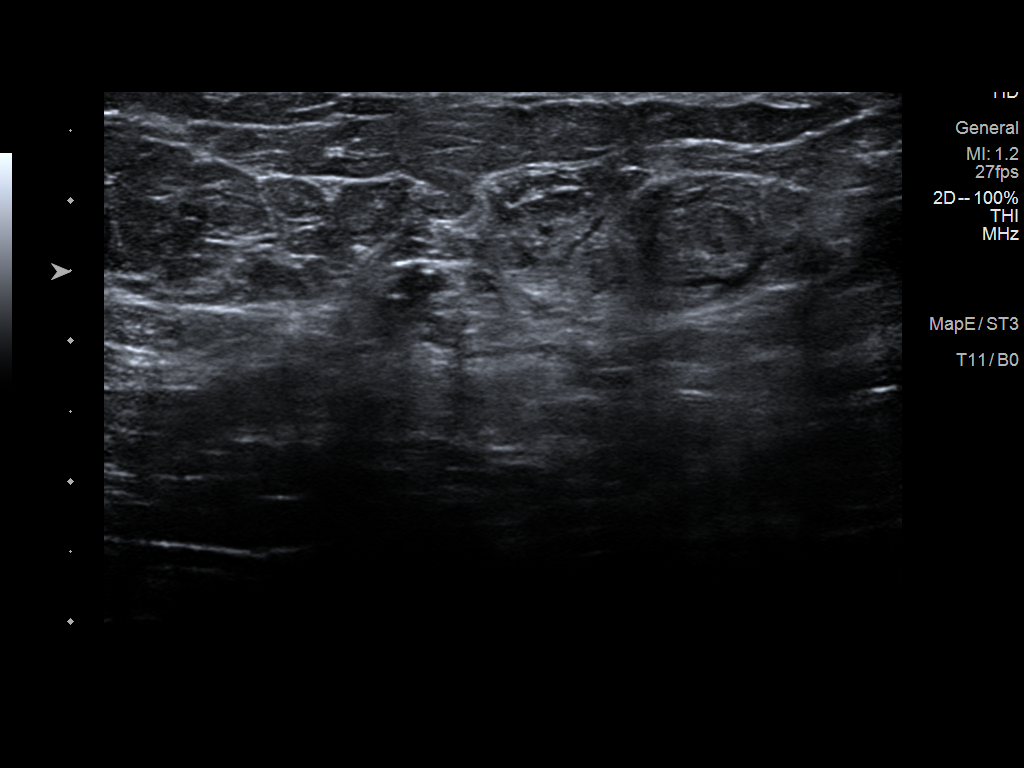

[1 of 1 positions shown; findings below may reference images not displayed]

FINDINGS: Patient presents for radioactive seed localization prior to targeted
lymph node dissection. I met with the patient and we discussed the
procedure of seed localization including benefits and alternatives.
We discussed the high likelihood of a successful procedure. We
discussed the risks of the procedure including infection, bleeding,
tissue injury and further surgery. We discussed the low dose of
radioactivity involved in the procedure. Informed, written consent
was given.

The usual time-out protocol was performed immediately prior to the
procedure.

Using ultrasound guidance, sterile technique, 1% lidocaine and an
[id number] radioactive seed, previously biopsied lymph node in the LEFT
axilla and containing a biopsy clip was localized using a lateral
approach. The follow-up mammogram images confirm the seed in the
expected location and were marked for Dr. Franck William.

Follow-up survey of the patient confirms presence of the radioactive
seed.

Order number of [id number] seed:  [id number].

Total activity:  0.247 millicuries reference Date: 05/20/2021

The patient tolerated the procedure well and was released from the
[REDACTED]. She was given instructions regarding seed removal.
IMPRESSION: Radioactive seed localization left breast. No apparent
complications.

## 2022-04-08 IMAGING — MG MM BREAST SURGICAL SPECIMEN
1 series · 2 of 2 positions shown · non-contrast
Comparison: Previous exam(s).

CLINICAL DATA: Patient status post targeted left axillary lymph
node dissection.

EXAM:
SPECIMEN RADIOGRAPH OF THE LEFT AXILLA

[Series 1: L · left · 0.07mm/px · 2 of 2 slices shown]
[im 1/2]
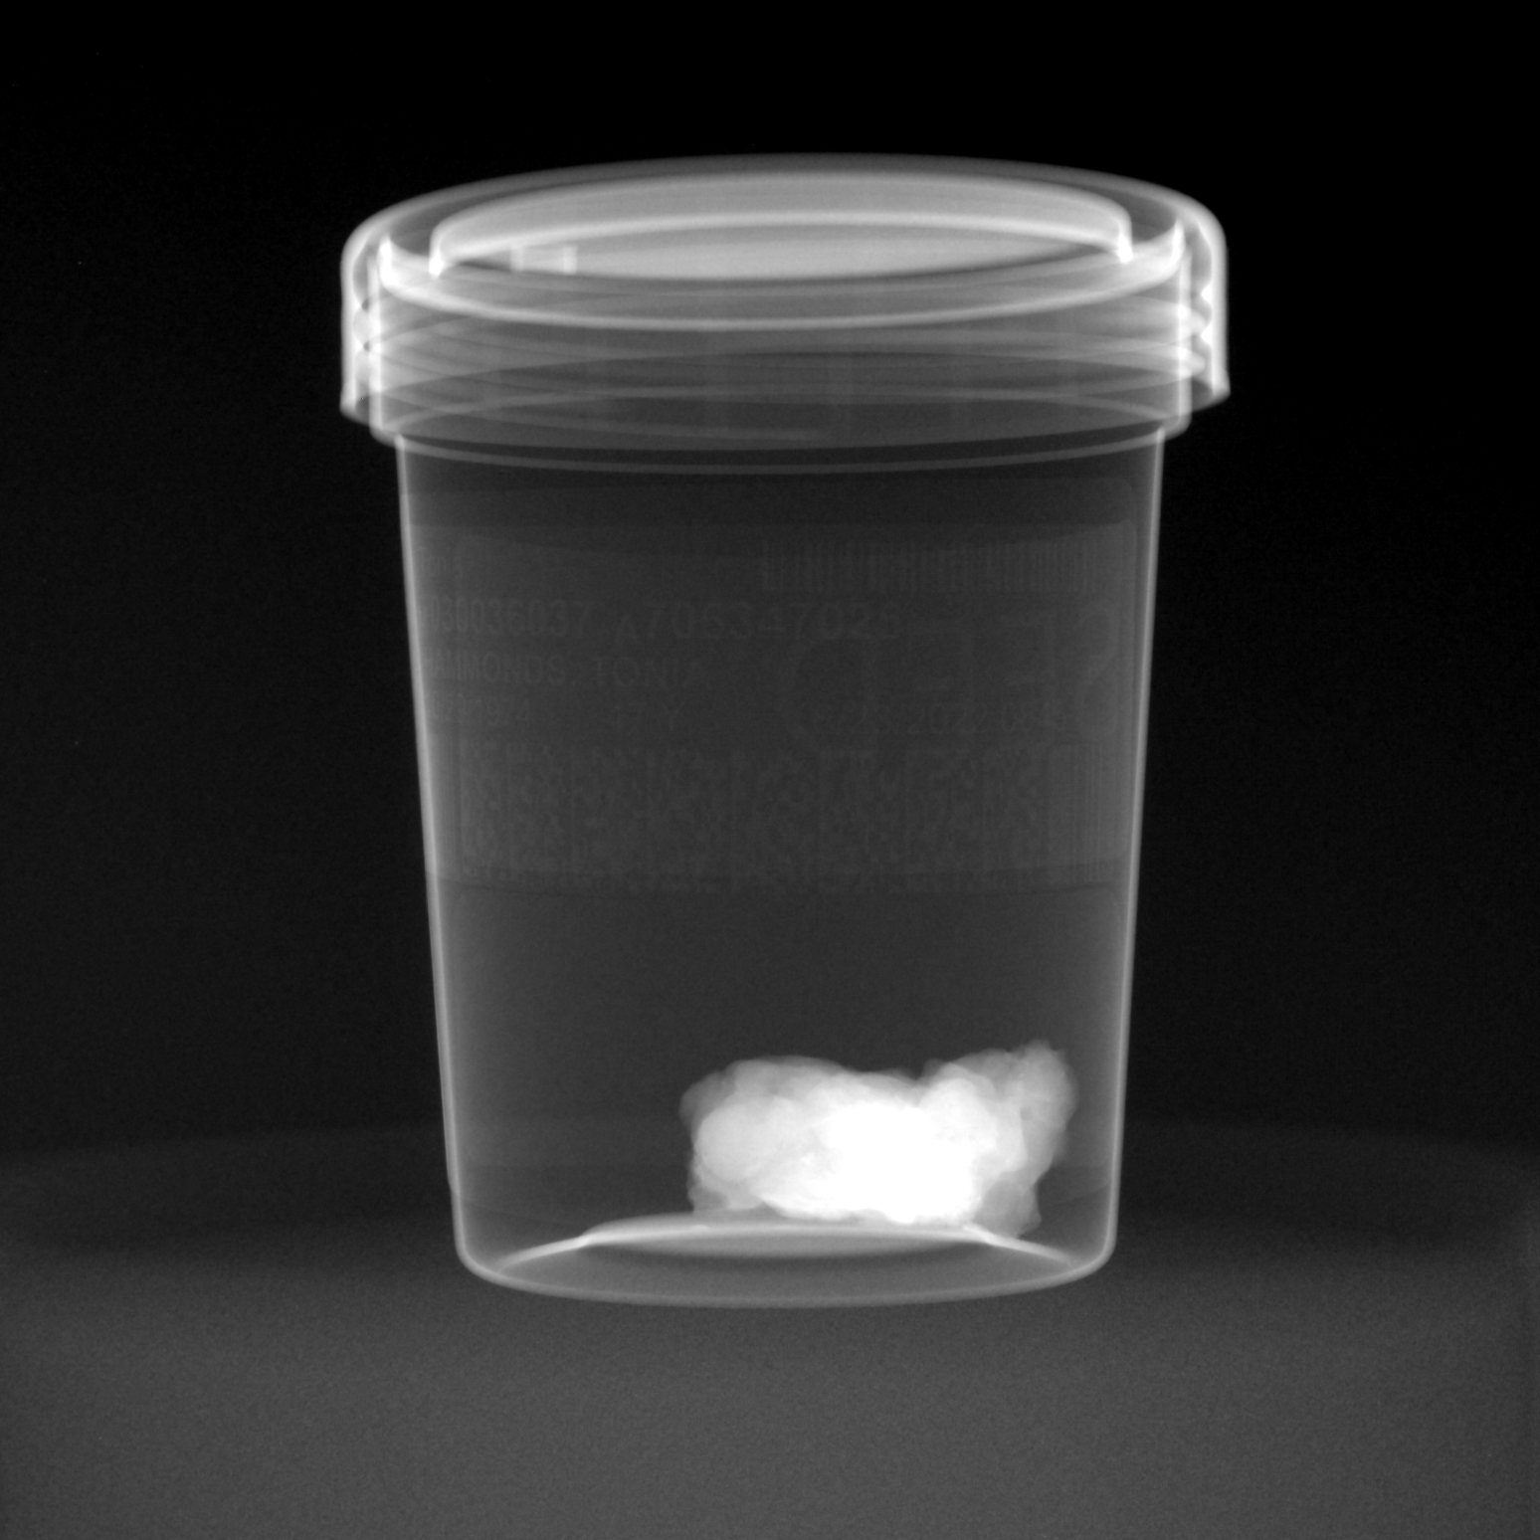
[im 2/2]
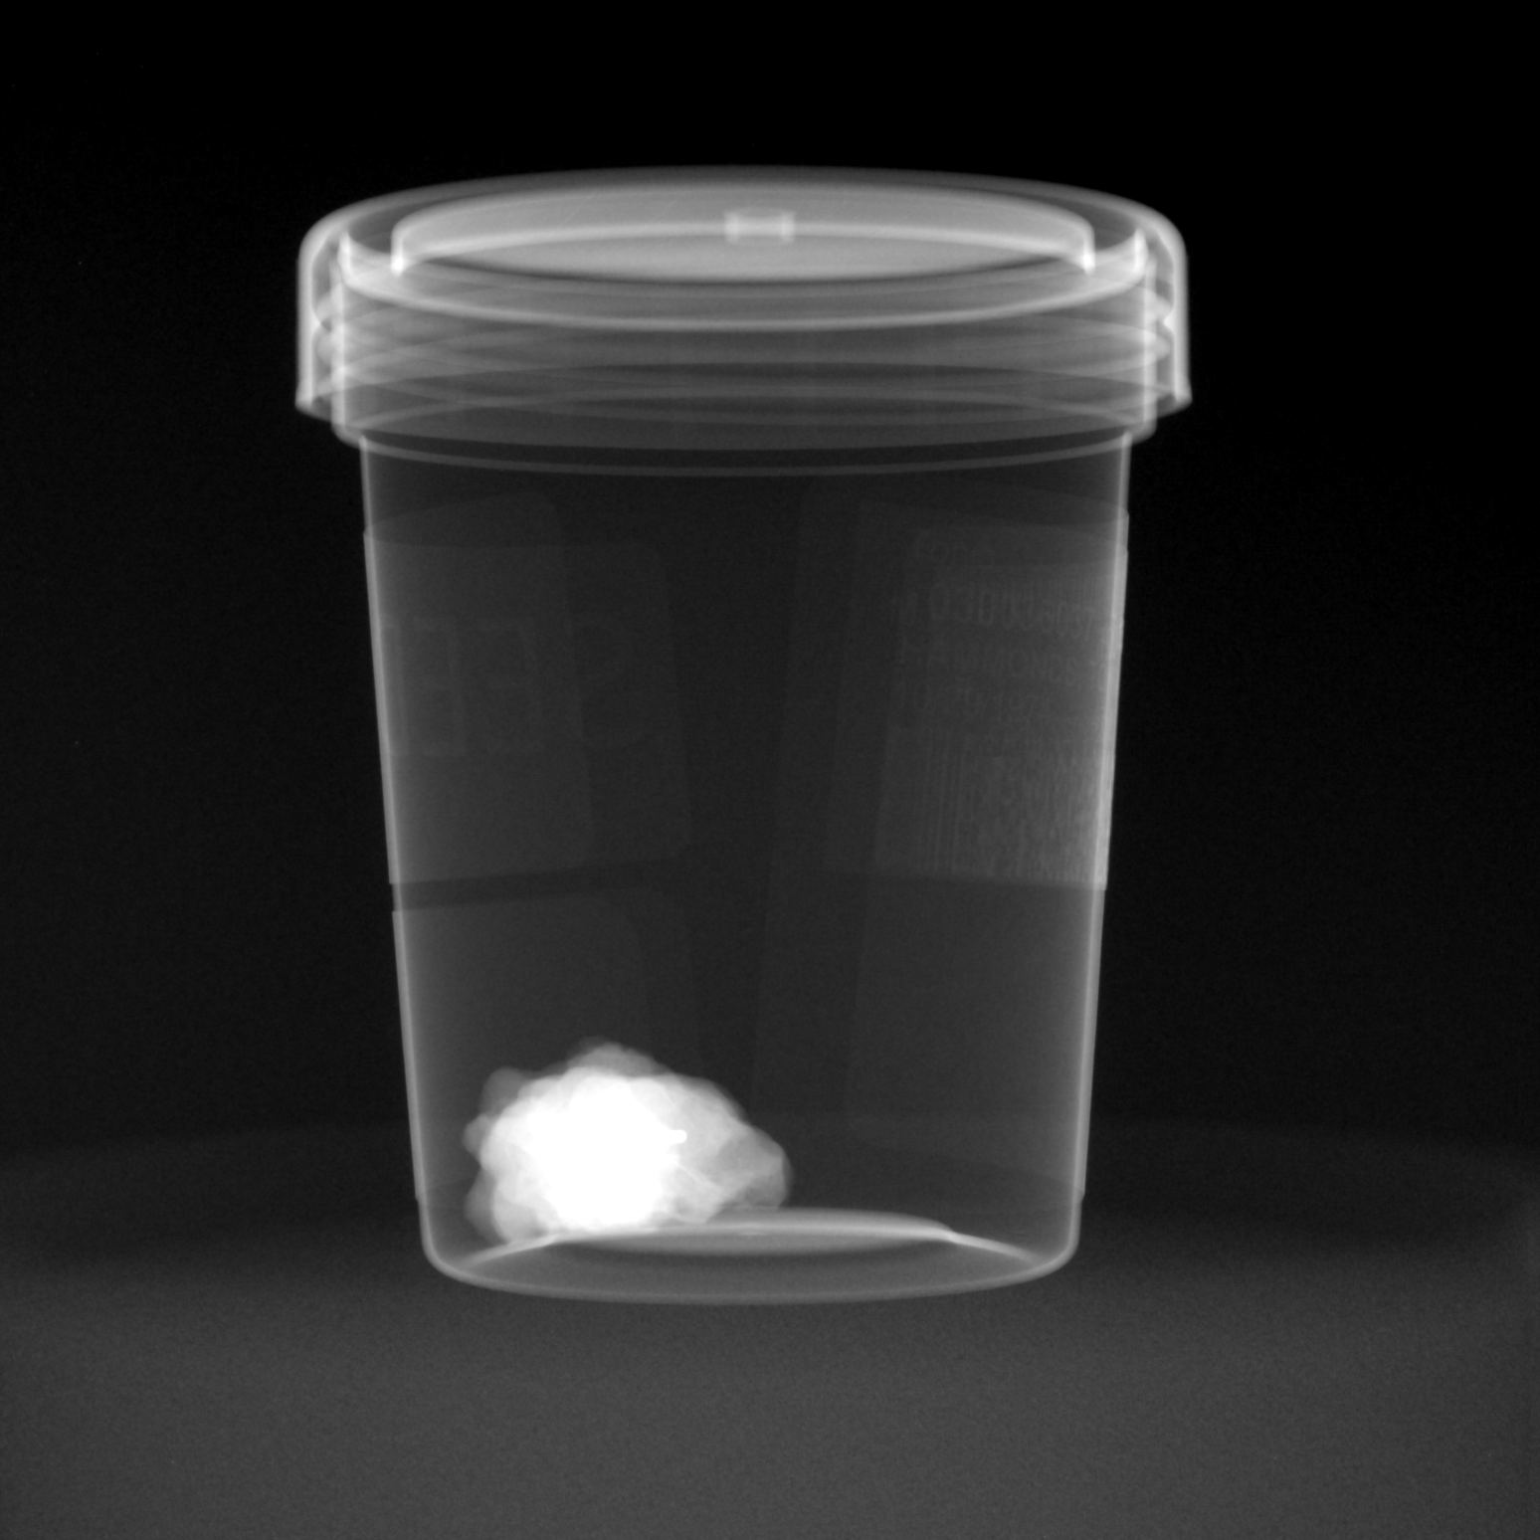

[2 of 2 positions shown; findings below may reference images not displayed]

FINDINGS: Status post excision of the left axilla. The radioactive seed is
present and completely intact. The biopsy marking clip is not
identified in the specimen.
IMPRESSION: Specimen radiograph of the left axilla.

## 2022-07-02 ENCOUNTER — Ambulatory Visit: Payer: Managed Care, Other (non HMO) | Admitting: Hematology and Oncology

## 2022-07-07 ENCOUNTER — Ambulatory Visit: Payer: Managed Care, Other (non HMO) | Attending: General Surgery

## 2022-07-07 VITALS — Wt 178.4 lb

## 2022-07-07 DIAGNOSIS — Z483 Aftercare following surgery for neoplasm: Secondary | ICD-10-CM | POA: Insufficient documentation

## 2022-07-07 NOTE — Therapy (Signed)
OUTPATIENT PHYSICAL THERAPY SOZO SCREENING NOTE   Patient Name: Maria Kennedy MRN: [id number] DOB:[birth year], 51 y.o., female Today's Date: 07/07/2022  PCP: London Pepper, MD REFERRING PROVIDER: Rolm Bookbinder, MD   PT End of Session - 07/07/22 1645     Visit Number 8   # unchanged due to screen only   PT Start Time 1660    PT Stop Time 1647    PT Time Calculation (min) 4 min    Activity Tolerance Patient tolerated treatment well    Behavior During Therapy WFL for tasks assessed/performed             Past Medical History:  Diagnosis Date   ADHD (attention deficit hyperactivity disorder)    Asthma    Cancer ([place])    breast ca   Depression    Family history of breast cancer    Family history of leukemia    Family history of lung cancer    Family history of pancreatic cancer    Personal history of chemotherapy    Personal history of radiation therapy    Past Surgical History:  Procedure Laterality Date   BREAST RECONSTRUCTION WITH PLACEMENT OF TISSUE EXPANDER AND ALLODERM Left 06/04/2021   Procedure: BREAST RECONSTRUCTION WITH PLACEMENT OF TISSUE EXPANDER AND ALLODERM;  Surgeon: Irene Limbo, MD;  Location: [place];  Service: Plastics;  Laterality: Left;   MASTECTOMY WITH RADIOACTIVE SEED GUIDED EXCISION AND AXILLARY SENTINEL LYMPH NODE BIOPSY Left 06/04/2021   Procedure: LEFT MASTECTOMY, LEFT AXILLARY SENTINEL LYMPH NODE BIOPSY, LEFT AXILLARY NODE SEED GUIDED EXCISION;  Surgeon: Rolm Bookbinder, MD;  Location: [place];  Service: General;  Laterality: Left;   PORTACATH PLACEMENT Right 01/07/2021   Procedure: INSERTION PORT-A-CATH;  Surgeon: Rolm Bookbinder, MD;  Location: [place];  Service: General;  Laterality: Right;   TUBAL LIGATION     Patient Active Problem List   Diagnosis Date Noted   S/P mastectomy, left 06/04/2021   Genetic testing 01/18/2021   Port-A-Cath in place 01/15/2021   Family  history of breast cancer    Family history of lung cancer    Family history of pancreatic cancer    Family history of leukemia    Malignant neoplasm of upper-outer quadrant of left breast in female, estrogen receptor negative ([place]) 12/24/2020   Benign paroxysmal positional vertigo of left ear 11/15/2018   ADD (attention deficit disorder) 08/11/2011    REFERRING DIAG: left breast cancer at risk for lymphedema  THERAPY DIAG: Aftercare following surgery for neoplasm  PERTINENT HISTORY: Pt had left mastectomy with immediate expander fon 06/04/2021. 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 She had neoadjuvant chemo followed by surgery and radiation which ended in November 2022  PRECAUTIONS: left UE Lymphedema risk, None  SUBJECTIVE: Pt returns for her 3 month L-Dex screen.   PAIN:  Are you having pain? No  SOZO SCREENING: Patient was assessed today using the SOZO machine to determine the lymphedema index score. This was compared to her baseline score. It was determined that she is within the recommended range when compared to her baseline and no further action is needed at this time. She will continue SOZO screenings. These are done every 3 months for 2 years post operatively followed by every 6 months for 2 years, and then annually.   L-DEX FLOWSHEETS - 07/07/22 1600       L-DEX LYMPHEDEMA SCREENING   Measurement  Type Unilateral    L-DEX MEASUREMENT EXTREMITY Upper Extremity    POSITION  Standing    DOMINANT SIDE Left    At Risk Side Left    BASELINE SCORE (UNILATERAL) -5.2    L-DEX SCORE (UNILATERAL) -1.7    VALUE CHANGE (UNILAT) 3.5              Otelia Limes, PTA 07/07/2022, 4:46 PM

## 2022-07-30 ENCOUNTER — Encounter: Payer: Self-pay | Admitting: Adult Health

## 2022-07-30 ENCOUNTER — Inpatient Hospital Stay: Payer: Managed Care, Other (non HMO) | Attending: Hematology and Oncology | Admitting: Adult Health

## 2022-07-30 ENCOUNTER — Other Ambulatory Visit: Payer: Self-pay

## 2022-07-30 VITALS — BP 126/90 | HR 83 | Temp 97.7°F | Resp 16 | Ht 63.0 in | Wt 180.7 lb

## 2022-07-30 DIAGNOSIS — C50412 Malignant neoplasm of upper-outer quadrant of left female breast: Secondary | ICD-10-CM | POA: Diagnosis not present

## 2022-07-30 DIAGNOSIS — Z171 Estrogen receptor negative status [ER-]: Secondary | ICD-10-CM

## 2022-07-30 DIAGNOSIS — Z1231 Encounter for screening mammogram for malignant neoplasm of breast: Secondary | ICD-10-CM | POA: Diagnosis not present

## 2022-07-30 DIAGNOSIS — Z9012 Acquired absence of left breast and nipple: Secondary | ICD-10-CM | POA: Diagnosis not present

## 2022-07-30 DIAGNOSIS — Z923 Personal history of irradiation: Secondary | ICD-10-CM | POA: Insufficient documentation

## 2022-07-30 DIAGNOSIS — Z853 Personal history of malignant neoplasm of breast: Secondary | ICD-10-CM | POA: Diagnosis present

## 2022-07-30 NOTE — Progress Notes (Signed)
SURVIVORSHIP VISIT:   BRIEF ONCOLOGIC HISTORY:  Oncology History  Malignant neoplasm of upper-outer quadrant of left breast in female, estrogen receptor negative ([place])  12/19/2020 Initial Diagnosis   Patient palpated a left breast mass and noted left axilla tenderness for one week. Diagnostic mammogram and US showed a conglomeration of masses in the left breast at the 2 o'clock position 3-4cm from the nipple spanning 3.9cm total, with a 0.7cm mass at the 2 o'clock position 6cm from the nipple, and two abnormal axillary lymph nodes. 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   12/26/2020 Cancer Staging   Staging form: Breast, AJCC 8th Edition - Clinical stage from 12/26/2020: Stage IIIA (cT3, cN1, cM0, G3, ER-, PR-, HER2+) - Signed by Nicholas Lose, MD on 12/26/2020 Stage prefix: Initial diagnosis Histologic grading system: 3 grade system   01/08/2021 - 05/14/2021 Chemotherapy   TCHP x6 cycles followed by Herceptin Perjeta maintenance       01/18/2021 Genetic Testing   Negative genetic testing:  No pathogenic variants detected on the [facility] CancerNext-Expanded + RNAinsight panel. The report date is 01/18/2021.   The CancerNext-Expanded + RNAinsight gene panel offered by [employer] and includes sequencing and rearrangement analysis for the following 77 genes: AIP, ALK, APC, ATM, AXIN2, BAP1, BARD1, BLM, BMPR1A, BRCA1, BRCA2, BRIP1, CDC73, CDH1, CDK4, CDKN1B, CDKN2A, CHEK2, CTNNA1, DICER1, FANCC, FH, FLCN, GALNT12, KIF1B, LZTR1, MAX, MEN1, MET, MLH1, MSH2, MSH3, MSH6, MUTYH, NBN, NF1, NF2, NTHL1, PALB2, PHOX2B, PMS2, POT1, PRKAR1A, PTCH1, PTEN, RAD51C, RAD51D, RB1, RECQL, RET, SDHA, SDHAF2, SDHB, SDHC, SDHD, SMAD4, SMARCA4, SMARCB1, SMARCE1, STK11, SUFU, TMEM127, TP53, TSC1, TSC2, VHL and XRCC2 (sequencing and deletion/duplication); EGFR, EGLN1, HOXB13, KIT, MITF, PDGFRA, POLD1 and POLE (sequencing only); EPCAM and GREM1  (deletion/duplication only). RNA data is routinely analyzed for use in variant interpretation for all genes.   06/22/2021 - 01/17/2022 Chemotherapy   Patient is on Treatment Plan : BREAST Trastuzumab + Pertuzumab q21d     07/16/2021 - 08/22/2021 Radiation Therapy   Site Technique Total Dose (Gy) Dose per Fx (Gy) Completed Fx Beam Energies  Chest Wall, Left: CW_Lt 3D 50.4/50.4 1.8 28/28 10X  Chest Wall, Left: CW_Lt_SCV_PAB 3D 50.4/50.4 1.8 28/28 6X, 10X       INTERVAL HISTORY:  Ms. Corralejo to review her survivorship care plan detailing her treatment course for breast cancer, as well as monitoring long-term side effects of that treatment, education regarding health maintenance, screening, and overall wellness and health promotion.     Overall, Ms. Kinkead reports feeling quite well.  She denies any new issues.  She is undergoing reconstructive surgery and is tolerating her procedures well.  She has one more surgery and she is looking forward to this surgery being her last.   REVIEW OF SYSTEMS:  Review of Systems  Constitutional:  Negative for appetite change, chills, fatigue, fever and unexpected weight change.  HENT:   Negative for hearing loss, lump/mass and trouble swallowing.   Eyes:  Negative for eye problems and icterus.  Respiratory:  Negative for chest tightness, cough and shortness of breath.   Cardiovascular:  Negative for chest pain, leg swelling and palpitations.  Gastrointestinal:  Negative for abdominal distention, abdominal pain, constipation, diarrhea, nausea and vomiting.  Endocrine: Negative for hot flashes.  Genitourinary:  Negative for difficulty urinating.   Musculoskeletal:  Negative for arthralgias.  Skin:  Negative for itching and rash.  Neurological:  Negative for dizziness, extremity weakness, headaches and numbness.  Hematological:  Negative for adenopathy. Does not bruise/bleed easily.  Psychiatric/Behavioral:  Negative for depression. The patient is not  nervous/anxious.   Breast: Denies any new nodularity, masses, tenderness, nipple changes, or nipple discharge.    ONCOLOGY TREATMENT TEAM:  1. Surgeon:  Dr. Donne Hazel at [facility] 2. Medical Oncologist: Dr. Lindi Adie  3. Radiation Oncologist: Dr. Isidore Moos    PAST MEDICAL/SURGICAL HISTORY:  Past Medical History:  Diagnosis Date   ADHD (attention deficit hyperactivity disorder)    Asthma    Cancer ([place])    breast ca   Depression    Family history of breast cancer    Family history of leukemia    Family history of lung cancer    Family history of pancreatic cancer    Personal history of chemotherapy    Personal history of radiation therapy    Past Surgical History:  Procedure Laterality Date   BREAST RECONSTRUCTION WITH PLACEMENT OF TISSUE EXPANDER AND ALLODERM Left 06/04/2021   Procedure: BREAST RECONSTRUCTION WITH PLACEMENT OF TISSUE EXPANDER AND ALLODERM;  Surgeon: Irene Limbo, MD;  Location: [place];  Service: Plastics;  Laterality: Left;   MASTECTOMY WITH RADIOACTIVE SEED GUIDED EXCISION AND AXILLARY SENTINEL LYMPH NODE BIOPSY Left 06/04/2021   Procedure: LEFT MASTECTOMY, LEFT AXILLARY SENTINEL LYMPH NODE BIOPSY, LEFT AXILLARY NODE SEED GUIDED EXCISION;  Surgeon: Rolm Bookbinder, MD;  Location: [place];  Service: General;  Laterality: Left;   PORTACATH PLACEMENT Right 01/07/2021   Procedure: INSERTION PORT-A-CATH;  Surgeon: Rolm Bookbinder, MD;  Location: [place];  Service: General;  Laterality: Right;   TUBAL LIGATION       ALLERGIES:  Allergies  Allergen Reactions   Latex Itching     CURRENT MEDICATIONS:  Outpatient Encounter Medications as of 07/30/2022  Medication Sig   acetaminophen (TYLENOL) 650 MG CR tablet Take 650 mg by mouth every 8 (eight) hours as needed for pain.   albuterol (VENTOLIN HFA) 108 (90 Base) MCG/ACT inhaler Inhale 2 puffs into the lungs every 6 (six) hours as needed for  wheezing or shortness of breath.   amphetamine-dextroamphetamine (ADDERALL XR) 20 MG 24 hr capsule Take 20 mg by mouth daily as needed.   ergocalciferol (VITAMIN D2) 1.25 MG (50000 UT) capsule Take 1 capsule (50,000 Units total) by mouth once a week.   [DISCONTINUED] lidocaine-prilocaine (EMLA) cream Apply 1 application topically as needed.   [DISCONTINUED] Menaquinone-7 (VITAMIN K2) 40 MCG TABS Take 2 tablets by mouth daily. 80 mcg daily total   [DISCONTINUED] ondansetron (ZOFRAN) 8 MG tablet Take 1 tablet (8 mg total) by mouth every 8 (eight) hours as needed for nausea or vomiting.   [DISCONTINUED] prochlorperazine (COMPAZINE) 10 MG tablet Take 1 tablet (10 mg total) by mouth every 6 (six) hours as needed (Nausea or vomiting).   [DISCONTINUED] VITAMIN D PO Take 5,000 Int'l Units by mouth every other day.   No facility-administered encounter medications on file as of 07/30/2022.     ONCOLOGIC FAMILY HISTORY:  Family History  Problem Relation Age of Onset   Hypertension Mother    Diabetes Mother    Pneumonia Mother    Lung cancer Father 43       smoker   Other Father        vertigo   Pancreatic cancer Paternal Grandmother        dx 66s   Hypertension Maternal Grandmother    Leukemia Maternal Grandfather 43   Breast cancer Maternal Aunt  dx >50   Prostate cancer Paternal Uncle    Aneurysm Half-Brother 50       DECEASED   Breast cancer Cousin        dx <50, maternal first cousin   Breast cancer Cousin        dx <50, maternal first cousin   Breast cancer Cousin 81       maternal first cousin once removed (cousin's daughter)   Prostate cancer Maternal Uncle        dx mid 37s   Prostate cancer Maternal Uncle        dx 55s   Colon cancer Maternal Uncle        dx 68s   Rectal cancer Maternal Uncle        dx 54s   Cancer Maternal Uncle        blood cancer, dx 8s    SOCIAL HISTORY:  Social History   Socioeconomic History   Marital status: Married    Spouse name:  Not on file   Number of children: 2   Years of education: Not on file   Highest education level: Not on file  Occupational History   Occupation: Human resources officer: [employer]  Tobacco Use   Smoking status: Never   Smokeless tobacco: Never  Vaping Use   Vaping Use: Never used  Substance and Sexual Activity   Alcohol use: Not Currently    Comment: 1 beer 2-3 times per month   Drug use: Never   Sexual activity: Not Currently    Partners: Male    Birth control/protection: Surgical  Other Topics Concern   Not on file  Social History Narrative   Lives at home with husband and child   Left handed   Caffeine: 1 cup of coffee in the mornings    Social Determinants of Health   Financial Resource Strain: Not on file  Food Insecurity: Not on file  Transportation Needs: Not on file  Physical Activity: Not on file  Stress: Not on file  Social Connections: Not on file  Intimate Partner Violence: Not on file     OBSERVATIONS/OBJECTIVE:  BP (!) 126/90 (BP Location: Right Arm, Patient Position: Sitting)   Pulse 83   Temp 97.7 F (36.5 C) (Temporal)   Resp 16   Ht 5' 3"  (1.6 m)   Wt 180 lb 11.2 oz (82 kg)   SpO2 100%   BMI 32.01 kg/m  GENERAL: Patient is a well appearing female in no acute distress HEENT:  Sclerae anicteric.  Oropharynx clear and moist. No ulcerations or evidence of oropharyngeal candidiasis. Neck is supple.  NODES:  No cervical, supraclavicular, or axillary lymphadenopathy palpated.  BREAST EXAM: left breast s/p mastectomy and reconstruction, no sign of local recurrence, right breast benign. LUNGS:  Clear to auscultation bilaterally.  No wheezes or rhonchi. HEART:  Regular rate and rhythm. No murmur appreciated. ABDOMEN:  Soft, nontender.  Positive, normoactive bowel sounds. No organomegaly palpated. MSK:  No focal spinal tenderness to palpation. Full range of motion bilaterally in the upper extremities. EXTREMITIES:  No peripheral edema.   SKIN:   Clear with no obvious rashes or skin changes. No nail dyscrasia. NEURO:  Nonfocal. Well oriented.  Appropriate affect.   LABORATORY DATA:  None for this visit.  DIAGNOSTIC IMAGING:  None for this visit.      ASSESSMENT AND PLAN:  Ms.. Schmuck is a pleasant 51 y.o. 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  She presents to the Survivorship Clinic for our initial meeting and routine follow-up post-completion of treatment for breast cancer.    1. Stage 3A left breast cancer:  Ms. Hucker is continuing to recover from definitive treatment for breast cancer. She will follow-up with her medical oncologist, Dr. Lindi Adie in 6 months with history and physical exam per surveillance protocol.  I recommended that she continue with annual right breast screening mammograms next due in April 2024.  Today, a comprehensive survivorship care plan and treatment summary was reviewed with the patient today detailing her breast cancer diagnosis, treatment course, potential late/long-term effects of treatment, appropriate follow-up care with recommendations for the future, and patient education resources.  A copy of this summary, along with a letter will be sent to the patient's primary care provider via mail/fax/In Basket message after today's visit.    2. Bone health: She was given education on specific activities to promote bone health.  3. Cancer screening:  Due to Ms. Luger's history and her age, she should receive screening for skin cancers, colon cancer, and gynecologic cancers.  The information and recommendations are listed on the patient's comprehensive care plan/treatment summary and were reviewed in detail with the patient.    4. Health maintenance and wellness promotion: Ms. Scobee was encouraged to consume 5-7 servings of fruits and vegetables per day.  We reviewed the "Nutrition Rainbow" handout.  She was also encouraged to engage in moderate to vigorous exercise for 30 minutes per day most days of the week. We discussed the [employer] fitness program, which is designed for cancer survivors to help them become more physically fit after cancer treatments.  She was instructed to limit her alcohol consumption and continue to abstain from tobacco use.     5. Support services/counseling: It is not uncommon for this period of the patient's cancer care trajectory to be one of many emotions and stressors. She was given information regarding our available services and encouraged to contact me with any questions or for help enrolling in any of our support group/programs.    Follow up instructions:    -Return to cancer center in 6 months for follow-up with Dr. Lindi Adie -Mammogram due in April 2024 -She is welcome to return back to the Survivorship Clinic at any time; no additional follow-up needed at this time.  -Consider referral back to survivorship as a long-term survivor for continued surveillance  The patient was provided an opportunity to ask questions and all were answered. The patient agreed with the plan and demonstrated an understanding of the instructions.   Total encounter time:40 minutes*in face-to-face visit time, chart review, lab review, care coordination, order entry, and documentation of the encounter time.    Wilber Bihari, NP 07/30/22 1:03 PM Medical Oncology and Hematology [facility] [place], [place] [address] Tel. [phone]    Fax. [phone]  *Total Encounter Time as defined by the Centers for Medicare and Medicaid Services includes, in addition to the face-to-face time of a patient visit (documented in the note above) non-face-to-face time: obtaining and reviewing outside history, ordering and reviewing medications, tests or procedures, care coordination (communications with other health  care professionals or caregivers) and documentation in the medical record.

## 2022-08-31 ENCOUNTER — Other Ambulatory Visit: Payer: Self-pay | Admitting: Hematology and Oncology

## 2022-09-29 ENCOUNTER — Ambulatory Visit: Payer: Managed Care, Other (non HMO)

## 2022-10-01 ENCOUNTER — Other Ambulatory Visit: Payer: Self-pay | Admitting: *Deleted

## 2022-10-01 DIAGNOSIS — R0609 Other forms of dyspnea: Secondary | ICD-10-CM

## 2022-10-02 ENCOUNTER — Ambulatory Visit: Payer: Managed Care, Other (non HMO) | Attending: General Surgery

## 2022-10-02 VITALS — Wt 179.5 lb

## 2022-10-02 DIAGNOSIS — Z483 Aftercare following surgery for neoplasm: Secondary | ICD-10-CM | POA: Insufficient documentation

## 2022-10-02 NOTE — Therapy (Signed)
OUTPATIENT PHYSICAL THERAPY SOZO SCREENING NOTE   Patient Name: Maria Kennedy MRN: [id number] DOB:01/07/1971, 51 y.o., female Today's Date: 10/02/2022  PCP: London Pepper, MD REFERRING PROVIDER: Rolm Bookbinder, MD   PT End of Session - 10/02/22 1102     Visit Number 8   # unchanged due to screen only   PT Start Time 1059    PT Stop Time 1105    PT Time Calculation (min) 6 min    Activity Tolerance Patient tolerated treatment well    Behavior During Therapy WFL for tasks assessed/performed             Past Medical History:  Diagnosis Date   ADHD (attention deficit hyperactivity disorder)    Asthma    Cancer ([place])    breast ca   Depression    Family history of breast cancer    Family history of leukemia    Family history of lung cancer    Family history of pancreatic cancer    Personal history of chemotherapy    Personal history of radiation therapy    Past Surgical History:  Procedure Laterality Date   BREAST RECONSTRUCTION WITH PLACEMENT OF TISSUE EXPANDER AND ALLODERM Left 06/04/2021   Procedure: BREAST RECONSTRUCTION WITH PLACEMENT OF TISSUE EXPANDER AND ALLODERM;  Surgeon: Irene Limbo, MD;  Location: [place];  Service: Plastics;  Laterality: Left;   MASTECTOMY WITH RADIOACTIVE SEED GUIDED EXCISION AND AXILLARY SENTINEL LYMPH NODE BIOPSY Left 06/04/2021   Procedure: LEFT MASTECTOMY, LEFT AXILLARY SENTINEL LYMPH NODE BIOPSY, LEFT AXILLARY NODE SEED GUIDED EXCISION;  Surgeon: Rolm Bookbinder, MD;  Location: [place];  Service: General;  Laterality: Left;   PORTACATH PLACEMENT Right 01/07/2021   Procedure: INSERTION PORT-A-CATH;  Surgeon: Rolm Bookbinder, MD;  Location: [place];  Service: General;  Laterality: Right;   TUBAL LIGATION     Patient Active Problem List   Diagnosis Date Noted   S/P mastectomy, left 06/04/2021   Genetic testing 01/18/2021   Port-A-Cath in place 01/15/2021   Family  history of breast cancer    Family history of lung cancer    Family history of pancreatic cancer    Family history of leukemia    Malignant neoplasm of upper-outer quadrant of left breast in female, estrogen receptor negative ([place]) 12/24/2020   Benign paroxysmal positional vertigo of left ear 11/15/2018   ADD (attention deficit disorder) 08/11/2011    REFERRING DIAG: left breast cancer at risk for lymphedema  THERAPY DIAG: Aftercare following surgery for neoplasm  PERTINENT HISTORY: Pt had left mastectomy with immediate expander fon 06/04/2021. 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 She had neoadjuvant chemo followed by surgery and radiation which ended in November 2022  PRECAUTIONS: left UE Lymphedema risk, None  SUBJECTIVE: Pt returns for her 3 month L-Dex screen.  "I had my final surgery for my DIEP flap surgery about 6 weeks ago and I feel like that went very well."  PAIN:  Are you having pain? No  SOZO SCREENING: Patient was assessed today using the SOZO machine to determine the lymphedema index score. This was compared to her baseline score. It was determined that she is within the recommended range when compared to her baseline and no further action is needed at this time. She will continue SOZO screenings. These are done every 3 months for 2 years post operatively followed by every 6 months for 2 years,  and then annually.   L-DEX FLOWSHEETS - 10/02/22 1100       L-DEX LYMPHEDEMA SCREENING   Measurement Type Unilateral    L-DEX MEASUREMENT EXTREMITY Upper Extremity    POSITION  Standing    DOMINANT SIDE Left    At Risk Side Left    BASELINE SCORE (UNILATERAL) -5.2    L-DEX SCORE (UNILATERAL) -3.2    VALUE CHANGE (UNILAT) 2              Otelia Limes, PTA 10/02/2022, 11:05 AM

## 2022-10-03 ENCOUNTER — Ambulatory Visit: Payer: Managed Care, Other (non HMO) | Admitting: Pulmonary Disease

## 2022-10-03 DIAGNOSIS — R0609 Other forms of dyspnea: Secondary | ICD-10-CM | POA: Diagnosis not present

## 2022-10-03 LAB — PULMONARY FUNCTION TEST
DL/VA % pred: 97 %
DL/VA: 4.23 ml/min/mmHg/L
DLCO cor % pred: 93 %
DLCO cor: 19.19 ml/min/mmHg
DLCO unc % pred: 89 %
DLCO unc: 18.38 ml/min/mmHg
FEF 25-75 Post: 3.98 L/sec
FEF 25-75 Pre: 3.5 L/sec
FEF2575-%Change-Post: 13 %
FEF2575-%Pred-Post: 145 %
FEF2575-%Pred-Pre: 127 %
FEV1-%Change-Post: 1 %
FEV1-%Pred-Post: 98 %
FEV1-%Pred-Pre: 96 %
FEV1-Post: 2.7 L
FEV1-Pre: 2.65 L
FEV1FVC-%Change-Post: 0 %
FEV1FVC-%Pred-Pre: 108 %
FEV6-%Change-Post: 1 %
FEV6-%Pred-Post: 92 %
FEV6-%Pred-Pre: 91 %
FEV6-Post: 3.09 L
FEV6-Pre: 3.05 L
FEV6FVC-%Pred-Post: 102 %
FEV6FVC-%Pred-Pre: 102 %
FVC-%Change-Post: 1 %
FVC-%Pred-Post: 89 %
FVC-%Pred-Pre: 88 %
FVC-Post: 3.09 L
FVC-Pre: 3.05 L
Post FEV1/FVC ratio: 87 %
Post FEV6/FVC ratio: 100 %
Pre FEV1/FVC ratio: 87 %
Pre FEV6/FVC Ratio: 100 %
RV % pred: 131 %
RV: 2.26 L
TLC % pred: 116 %
TLC: 5.71 L

## 2022-10-03 NOTE — Patient Instructions (Signed)
Full PFT performed today. °

## 2022-10-03 NOTE — Progress Notes (Signed)
Full PFT performed today. °

## 2023-01-19 ENCOUNTER — Ambulatory Visit: Payer: Managed Care, Other (non HMO)

## 2023-01-29 ENCOUNTER — Inpatient Hospital Stay: Payer: Managed Care, Other (non HMO) | Admitting: Hematology and Oncology

## 2023-01-29 ENCOUNTER — Telehealth: Payer: Self-pay | Admitting: Hematology and Oncology

## 2023-01-29 NOTE — Telephone Encounter (Signed)
Patient called about appointments tomorrow.patient had surgery and rescheduled for next week.

## 2023-01-29 NOTE — Assessment & Plan Note (Deleted)
12/19/2020: Palpable left breast mass and left axillary tenderness.  Mammogram revealed conglomeration of masses 3.9 cm and an adjacent 0.7 cm mass and 2 axillary lymph nodes that were abnormal. Biopsy revealed IDC with DCIS, grade 3, ER/PR negative, HER-2 +3+ by [facility]   12/24/2020: Breast MRI revealed mass or non-mass enhancement measuring 9.5 cm and 3 abnormal lymph nodes 01-02-21: Bone scan: No bone mets 01/01/2021: CT CAP: No distant metastatic disease   Treatment Plan: 1. Neoadjuvant chemotherapy with TCH Perjeta 6 cycles followed by Herceptin Perjeta maintenance completed 01/17/2022 2. left mastectomy 06/04/2021: Dr. Dwain Sarna: Complete pathologic response, no residual cancer, 0/1 lymph node negative 3. Followed by adjuvant radiation therapy completed 08/22/2021 --------------------------------------------------------------------------------------------------------- Breast cancer surveillance: Right breast mammogram scheduled for 02/17/2023 Breast exam: 01/29/2023: Benign (D IEP flap at [facility] left breast May 2023)  Return to clinic in 1 year for follow-up

## 2023-02-05 ENCOUNTER — Encounter: Payer: Self-pay | Admitting: Adult Health

## 2023-02-05 ENCOUNTER — Other Ambulatory Visit: Payer: Self-pay

## 2023-02-05 ENCOUNTER — Inpatient Hospital Stay: Payer: Managed Care, Other (non HMO) | Attending: Hematology and Oncology | Admitting: Adult Health

## 2023-02-05 VITALS — BP 128/91 | HR 96 | Temp 97.5°F | Resp 18 | Ht 63.0 in | Wt 176.8 lb

## 2023-02-05 DIAGNOSIS — Z9221 Personal history of antineoplastic chemotherapy: Secondary | ICD-10-CM | POA: Insufficient documentation

## 2023-02-05 DIAGNOSIS — Z800 Family history of malignant neoplasm of digestive organs: Secondary | ICD-10-CM | POA: Diagnosis not present

## 2023-02-05 DIAGNOSIS — Z9012 Acquired absence of left breast and nipple: Secondary | ICD-10-CM | POA: Diagnosis not present

## 2023-02-05 DIAGNOSIS — Z801 Family history of malignant neoplasm of trachea, bronchus and lung: Secondary | ICD-10-CM | POA: Diagnosis not present

## 2023-02-05 DIAGNOSIS — Z923 Personal history of irradiation: Secondary | ICD-10-CM | POA: Insufficient documentation

## 2023-02-05 DIAGNOSIS — Z171 Estrogen receptor negative status [ER-]: Secondary | ICD-10-CM

## 2023-02-05 DIAGNOSIS — E119 Type 2 diabetes mellitus without complications: Secondary | ICD-10-CM | POA: Diagnosis not present

## 2023-02-05 DIAGNOSIS — C50412 Malignant neoplasm of upper-outer quadrant of left female breast: Secondary | ICD-10-CM

## 2023-02-05 DIAGNOSIS — Z803 Family history of malignant neoplasm of breast: Secondary | ICD-10-CM | POA: Insufficient documentation

## 2023-02-05 DIAGNOSIS — Z853 Personal history of malignant neoplasm of breast: Secondary | ICD-10-CM | POA: Diagnosis present

## 2023-02-05 DIAGNOSIS — Z806 Family history of leukemia: Secondary | ICD-10-CM | POA: Diagnosis not present

## 2023-02-05 NOTE — Assessment & Plan Note (Signed)
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   Treatment Plan: 1. Neoadjuvant chemotherapy with TCH Perjeta 6 cycles followed by Herceptin Perjeta maintenance completed 01/17/2022 2. left mastectomy 06/04/2021: Dr. Dwain Sarna: Complete pathologic response, no residual cancer, 0/1 lymph node negative 3. 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  Her next right breast mammogram is due and scheduled for next month.  She will continue to undergo annual mammograms and we will see her every 6 months for follow-up. Health maintenance: I recommended continued healthy diet and exercise.  She is working on this especially since she has a new diagnosis of type 2 diabetes.  She will return to clinic in 6 months for follow-up.  She knows to call for any questions or concerns that may arise between now and her next appointment with [place].

## 2023-02-05 NOTE — Progress Notes (Signed)
[facility] Cancer Follow up:    Maria Has, MD [address] [place] [address]   DIAGNOSIS:  Cancer Staging  Malignant neoplasm of upper-outer quadrant of left breast in female, estrogen receptor negative Staging form: Breast, AJCC 8th Edition - Clinical stage from 12/26/2020: Stage IIIA (cT3, cN1, cM0, G3, ER-, PR-, HER2+) - Signed by Serena Croissant, MD on 12/26/2020 Stage prefix: Initial diagnosis Histologic grading system: 3 grade system   SUMMARY OF ONCOLOGIC HISTORY: Oncology History  Malignant neoplasm of upper-outer quadrant of left breast in female, estrogen receptor negative  12/19/2020 Initial Diagnosis   Patient palpated a left breast mass and noted left axilla tenderness for one week. Diagnostic mammogram and US showed a conglomeration of masses in the left breast at the 2 o'clock position 3-4cm from the nipple spanning 3.9cm total, with a 0.7cm mass at the 2 o'clock position 6cm from the nipple, and two abnormal axillary lymph nodes. 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   12/26/2020 Cancer Staging   Staging form: Breast, AJCC 8th Edition - Clinical stage from 12/26/2020: Stage IIIA (cT3, cN1, cM0, G3, ER-, PR-, HER2+) - Signed by Serena Croissant, MD on 12/26/2020 Stage prefix: Initial diagnosis Histologic grading system: 3 grade system   01/08/2021 - 05/14/2021 Chemotherapy   TCHP x6 cycles followed by Herceptin Perjeta maintenance       01/18/2021 Genetic Testing   Negative genetic testing:  No pathogenic variants detected on the [facility] CancerNext-Expanded + RNAinsight panel. The report date is 01/18/2021.   The CancerNext-Expanded + RNAinsight gene panel offered by [employer] and includes sequencing and rearrangement analysis for the following 77 genes: AIP, ALK, APC, ATM, AXIN2, BAP1, BARD1, BLM, BMPR1A, BRCA1, BRCA2, BRIP1, CDC73, CDH1, CDK4, CDKN1B, CDKN2A, CHEK2,  CTNNA1, DICER1, FANCC, FH, FLCN, GALNT12, KIF1B, LZTR1, MAX, MEN1, MET, MLH1, MSH2, MSH3, MSH6, MUTYH, NBN, NF1, NF2, NTHL1, PALB2, PHOX2B, PMS2, POT1, PRKAR1A, PTCH1, PTEN, RAD51C, RAD51D, RB1, RECQL, RET, SDHA, SDHAF2, SDHB, SDHC, SDHD, SMAD4, SMARCA4, SMARCB1, SMARCE1, STK11, SUFU, TMEM127, TP53, TSC1, TSC2, VHL and XRCC2 (sequencing and deletion/duplication); EGFR, EGLN1, HOXB13, KIT, MITF, PDGFRA, POLD1 and POLE (sequencing only); EPCAM and GREM1 (deletion/duplication only). RNA data is routinely analyzed for use in variant interpretation for all genes.   06/04/2021 Surgery   left mastectomy: Dr. Dwain Sarna: Complete pathologic response, no residual cancer, 0/1 lymph node negative    06/22/2021 - 01/17/2022 Chemotherapy   Patient is on Treatment Plan : BREAST Trastuzumab + Pertuzumab q21d     07/16/2021 - 08/22/2021 Radiation Therapy   Site Technique Total Dose (Gy) Dose per Fx (Gy) Completed Fx Beam Energies  Chest Wall, Left: CW_Lt 3D 50.4/50.4 1.8 28/28 10X  Chest Wall, Left: CW_Lt_SCV_PAB 3D 50.4/50.4 1.8 28/28 6X, 10X       CURRENT THERAPY: observation  INTERVAL HISTORY: Maria Kennedy 52 y.o. female returns for f/u of her history of breast cancer.  Her most recent mammogram occurred in 01/2022 and was negative for malignancy with breast density category C.   She [facility]  She tolerated this well.  Her next right breast mammogram is scheduled in May.  Patient Active Problem List   Diagnosis Date Noted   Type 2 diabetes mellitus 02/05/2023   S/P mastectomy, left 06/04/2021   Genetic testing 01/18/2021   Family history of breast cancer    Family history of lung cancer  Family history of pancreatic cancer    Family history of leukemia    Malignant neoplasm of upper-outer quadrant of left breast in female, estrogen receptor negative 12/24/2020   Benign paroxysmal positional vertigo of left ear 11/15/2018   ADD  (attention deficit disorder) 08/11/2011    is allergic to latex.  MEDICAL HISTORY: Past Medical History:  Diagnosis Date   ADHD (attention deficit hyperactivity disorder)    Asthma    Cancer    breast ca   Depression    Family history of breast cancer    Family history of leukemia    Family history of lung cancer    Family history of pancreatic cancer    Personal history of chemotherapy    Personal history of radiation therapy    Port-A-Cath in place 01/15/2021    SURGICAL HISTORY: Past Surgical History:  Procedure Laterality Date   BREAST RECONSTRUCTION WITH PLACEMENT OF TISSUE EXPANDER AND ALLODERM Left 06/04/2021   Procedure: BREAST RECONSTRUCTION WITH PLACEMENT OF TISSUE EXPANDER AND ALLODERM;  Surgeon: Glenna Fellows, MD;  Location: [facility];  Service: Plastics;  Laterality: Left;   MASTECTOMY WITH RADIOACTIVE SEED GUIDED EXCISION AND AXILLARY SENTINEL LYMPH NODE BIOPSY Left 06/04/2021   Procedure: LEFT MASTECTOMY, LEFT AXILLARY SENTINEL LYMPH NODE BIOPSY, LEFT AXILLARY NODE SEED GUIDED EXCISION;  Surgeon: Emelia Loron, MD;  Location: [facility];  Service: General;  Laterality: Left;   PORTACATH PLACEMENT Right 01/07/2021   Procedure: INSERTION PORT-A-CATH;  Surgeon: Emelia Loron, MD;  Location: [facility];  Service: General;  Laterality: Right;   TUBAL LIGATION      SOCIAL HISTORY: Social History   Socioeconomic History   Marital status: Married    Spouse name: Not on file   Number of children: 2   Years of education: Not on file   Highest education level: Not on file  Occupational History   Occupation: Designer, fashion/clothing: [employer]  Tobacco Use   Smoking status: Never   Smokeless tobacco: Never  Vaping Use   Vaping Use: Never used  Substance and Sexual Activity   Alcohol use: Not Currently    Comment: 1 beer 2-3 times per month   Drug use: Never   Sexual activity: Not Currently     Partners: Male    Birth control/protection: Surgical  Other Topics Concern   Not on file  Social History Narrative   Lives at home with husband and child   Left handed   Caffeine: 1 cup of coffee in the mornings    Social Determinants of Health   Financial Resource Strain: Not on file  Food Insecurity: Not on file  Transportation Needs: Not on file  Physical Activity: Not on file  Stress: Not on file  Social Connections: Not on file  Intimate Partner Violence: Not on file    FAMILY HISTORY: Family History  Problem Relation Age of Onset   Hypertension Mother    Diabetes Mother    Pneumonia Mother    Lung cancer Father 78       smoker   Other Father        vertigo   Pancreatic cancer Paternal Grandmother        dx 77s   Hypertension Maternal Grandmother    Leukemia Maternal Grandfather 51   Breast cancer Maternal Aunt        dx >50   Prostate cancer Paternal Uncle    Aneurysm Half-Brother 61  DECEASED   Breast cancer Cousin        dx <50, maternal first cousin   Breast cancer Cousin        dx <50, maternal first cousin   Breast cancer Cousin 30       maternal first cousin once removed (cousin's daughter)   Prostate cancer Maternal Uncle        dx mid 57s   Prostate cancer Maternal Uncle        dx 61s   Colon cancer Maternal Uncle        dx 93s   Rectal cancer Maternal Uncle        dx 63s   Cancer Maternal Uncle        blood cancer, dx 40s    Review of Systems  Constitutional:  Negative for appetite change, chills, fatigue, fever and unexpected weight change.  HENT:   Negative for hearing loss, lump/mass and trouble swallowing.   Eyes:  Negative for eye problems and icterus.  Respiratory:  Negative for chest tightness, cough and shortness of breath.   Cardiovascular:  Negative for chest pain, leg swelling and palpitations.  Gastrointestinal:  Negative for abdominal distention, abdominal pain, constipation, diarrhea, nausea and vomiting.  Endocrine:  Negative for hot flashes.  Genitourinary:  Negative for difficulty urinating.   Musculoskeletal:  Negative for arthralgias.  Skin:  Negative for itching and rash.  Neurological:  Negative for dizziness, extremity weakness, headaches and numbness.  Hematological:  Negative for adenopathy. Does not bruise/bleed easily.  Psychiatric/Behavioral:  Negative for depression. The patient is not nervous/anxious.       PHYSICAL EXAMINATION    Vitals:   02/05/23 1412  BP: (!) 128/91  Pulse: 96  Resp: 18  Temp: (!) 97.5 F (36.4 C)  SpO2: 100%    Physical Exam Constitutional:      General: She is not in acute distress.    Appearance: Normal appearance. She is not toxic-appearing.  HENT:     Head: Normocephalic and atraumatic.  Eyes:     General: No scleral icterus. Cardiovascular:     Rate and Rhythm: Normal rate and regular rhythm.     Pulses: Normal pulses.     Heart sounds: Normal heart sounds.  Pulmonary:     Effort: Pulmonary effort is normal.     Breath sounds: Normal breath sounds.  Chest:     Comments: Left breast s/p mastectomy and radiation with reconstruction no sign of local recurrence, right breast benign Abdominal:     General: Abdomen is flat. Bowel sounds are normal. There is no distension.     Palpations: Abdomen is soft.     Tenderness: There is no abdominal tenderness.  Musculoskeletal:        General: No swelling.     Cervical back: Neck supple.  Lymphadenopathy:     Cervical: No cervical adenopathy.  Skin:    General: Skin is warm and dry.     Findings: No rash.  Neurological:     General: No focal deficit present.     Mental Status: She is alert.  Psychiatric:        Mood and Affect: Mood normal.        Behavior: Behavior normal.     LABORATORY DATA: None today   ASSESSMENT and THERAPY PLAN:   Malignant neoplasm of upper-outer quadrant of left breast in female, estrogen receptor negative (HCC) Maria Kennedy is a 52 year old woman with history of  left breast triple negative breast cancer  stage Ib status post neoadjuvant chemotherapy followed by mastectomy followed by radiation followed by D IEP reconstruction.   Treatment Plan: 1. Neoadjuvant chemotherapy with TCH Perjeta 6 cycles followed by Herceptin Perjeta maintenance completed 01/17/2022 2. left mastectomy 06/04/2021: Dr. Dwain Sarna: Complete pathologic response, no residual cancer, 0/1 lymph node negative 3. 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  Her next right breast mammogram is due and scheduled for next month.  She will continue to undergo annual mammograms and we will see her every 6 months for follow-up. Health maintenance: I recommended continued healthy diet and exercise.  She is working on this especially since she Kennedy a new diagnosis of type 2 diabetes.  She will return to clinic in 6 months for follow-up.  She knows to call for any questions or concerns that may arise between now and her next appointment with [place].    All questions were answered. The patient knows to call the clinic with any problems, questions or concerns. We can certainly see the patient much sooner if necessary.  Total encounter time:30 minutes*in face-to-face visit time, chart review, lab review, care coordination, order entry, and documentation of the encounter time.    Lillard Anes, NP 02/05/23 2:30 PM Medical Oncology and Hematology [facility] [address] [place], [place] [address] Tel. [phone]    Fax. [phone]  *Total Encounter Time as defined by the Centers for Medicare and Medicaid Services includes, in addition to the face-to-face time of a patient visit (documented in the note  above) non-face-to-face time: obtaining and reviewing outside history, ordering and reviewing medications, tests or procedures, care coordination (communications with other health care professionals or caregivers) and documentation in the medical record.

## 2023-02-17 ENCOUNTER — Ambulatory Visit: Payer: Managed Care, Other (non HMO)

## 2023-03-02 ENCOUNTER — Ambulatory Visit: Payer: Managed Care, Other (non HMO)

## 2023-03-12 ENCOUNTER — Ambulatory Visit: Payer: Managed Care, Other (non HMO) | Admitting: Rehabilitation

## 2023-03-19 ENCOUNTER — Ambulatory Visit
Admission: RE | Admit: 2023-03-19 | Discharge: 2023-03-19 | Disposition: A | Discharge status: Home / Self Care | Payer: Managed Care, Other (non HMO) | Source: Ambulatory Visit | Attending: Adult Health | Admitting: Adult Health

## 2023-03-19 DIAGNOSIS — Z1231 Encounter for screening mammogram for malignant neoplasm of breast: Secondary | ICD-10-CM

## 2023-03-30 ENCOUNTER — Ambulatory Visit: Payer: Managed Care, Other (non HMO) | Attending: General Surgery

## 2023-03-30 VITALS — Wt 173.4 lb

## 2023-03-30 DIAGNOSIS — Z483 Aftercare following surgery for neoplasm: Secondary | ICD-10-CM | POA: Insufficient documentation

## 2023-03-30 NOTE — Therapy (Signed)
OUTPATIENT PHYSICAL THERAPY SOZO SCREENING NOTE   Patient Name: Maria Kennedy MRN: [id number] DOB:12-Dec-1970, 52 y.o., female Today's Date: 03/30/2023  PCP: Farris Has, MD REFERRING PROVIDER: Emelia Loron, MD   PT End of Session - 03/30/23 1624     Visit Number 8   # unchanged due to screen only   PT Start Time 1622    PT Stop Time 1630    PT Time Calculation (min) 8 min    Activity Tolerance Patient tolerated treatment well    Behavior During Therapy WFL for tasks assessed/performed             Past Medical History:  Diagnosis Date   ADHD (attention deficit hyperactivity disorder)    Asthma    Cancer (HCC)    breast ca   Depression    Family history of breast cancer    Family history of leukemia    Family history of lung cancer    Family history of pancreatic cancer    Personal history of chemotherapy    Personal history of radiation therapy    Port-A-Cath in place 01/15/2021   Past Surgical History:  Procedure Laterality Date   BREAST RECONSTRUCTION WITH PLACEMENT OF TISSUE EXPANDER AND ALLODERM Left 06/04/2021   Procedure: BREAST RECONSTRUCTION WITH PLACEMENT OF TISSUE EXPANDER AND ALLODERM;  Surgeon: Glenna Fellows, MD;  Location: [facility];  Service: Plastics;  Laterality: Left;   MASTECTOMY WITH RADIOACTIVE SEED GUIDED EXCISION AND AXILLARY SENTINEL LYMPH NODE BIOPSY Left 06/04/2021   Procedure: LEFT MASTECTOMY, LEFT AXILLARY SENTINEL LYMPH NODE BIOPSY, LEFT AXILLARY NODE SEED GUIDED EXCISION;  Surgeon: Emelia Loron, MD;  Location: [facility];  Service: General;  Laterality: Left;   PORTACATH PLACEMENT Right 01/07/2021   Procedure: INSERTION PORT-A-CATH;  Surgeon: Emelia Loron, MD;  Location: [facility];  Service: General;  Laterality: Right;   TUBAL LIGATION     Patient Active Problem List   Diagnosis Date Noted   Type 2 diabetes mellitus (HCC) 02/05/2023   S/P mastectomy, left 06/04/2021    Genetic testing 01/18/2021   Family history of breast cancer    Family history of lung cancer    Family history of pancreatic cancer    Family history of leukemia    Malignant neoplasm of upper-outer quadrant of left breast in female, estrogen receptor negative (HCC) 12/24/2020   Benign paroxysmal positional vertigo of left ear 11/15/2018   ADD (attention deficit disorder) 08/11/2011    REFERRING DIAG: left breast cancer at risk for lymphedema  THERAPY DIAG: Aftercare following surgery for neoplasm  PERTINENT HISTORY: Pt had left mastectomy with immediate expander fon 06/04/2021. 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 She had neoadjuvant chemo followed by surgery and radiation which ended in November 2022  PRECAUTIONS: left UE Lymphedema risk, None  SUBJECTIVE: Pt returns for her 3 month L-Dex screen.  "I've been doing pretty good."  PAIN:  Are you having pain? No  SOZO SCREENING: Patient was assessed today using the SOZO machine to determine the lymphedema index score. This was compared to her baseline score. It was determined that she is within the recommended range when compared to her baseline and no further action is needed at this time. She will continue SOZO screenings. These are done every 3 months for 2 years post operatively followed by every 6 months for 2 years, and then annually. Issued script for a compression sleeve  since pts change from baseline, though WNLs, was elevated from previous ones and she wanted to pursue getting a compression sleeve. She was given onfo to try calling [facility] to see if they take her insurance. If not was given lymphedemaproducts website and to try a [employer]. Pt verbalized understanding.    L-DEX FLOWSHEETS - 03/30/23 1600       L-DEX LYMPHEDEMA SCREENING   Measurement Type Unilateral    L-DEX MEASUREMENT EXTREMITY Upper Extremity    POSITION  Standing     DOMINANT SIDE Left    At Risk Side Left    BASELINE SCORE (UNILATERAL) -5.2    L-DEX SCORE (UNILATERAL) -0.2    VALUE CHANGE (UNILAT) 5            P: Cont 3 month screen 1 more time then begin 6 months if she doesn't have an elevated change from baseline.   Hermenia Bers, PTA 03/30/2023, 4:36 PM

## 2023-04-03 ENCOUNTER — Ambulatory Visit: Payer: Managed Care, Other (non HMO) | Admitting: Dietician

## 2023-06-10 ENCOUNTER — Encounter: Payer: Managed Care, Other (non HMO) | Attending: Family Medicine | Admitting: Skilled Nursing Facility1

## 2023-06-10 ENCOUNTER — Encounter: Payer: Self-pay | Admitting: Skilled Nursing Facility1

## 2023-06-10 VITALS — Ht 63.5 in | Wt 170.5 lb

## 2023-06-10 DIAGNOSIS — E119 Type 2 diabetes mellitus without complications: Secondary | ICD-10-CM | POA: Diagnosis present

## 2023-06-10 NOTE — Progress Notes (Signed)
Pt states the metformin makes her feel weak so she does not take it regularly.  Pt states her fasting's are about 130's.  Pt states she got her A1C back down to 6.5 stating she cut back on sweets stating she is bad about eating sweets at night and has watched her portion sizes. Pt states they do eat out a lot.  Pt states she is a snacker skipping breakfast and waking at 5am as a Merchandiser, retail over the customer representatives. Pt states she just does not havean urge to drink water.  Pt states they eat out most dinners.    Body Composition Scale 06/10/2023  Current Body Weight 170.5  Total Body Fat % 35.1  Visceral Fat 9  Fat-Free Mass % 64.8   Total Body Water % 46.8  Muscle-Mass lbs 46.9  BMI 30.2  Body Fat Displacement          Torso  lbs 36.9         Left Leg  lbs 7.3         Right Leg  lbs 7.3         Left Arm  lbs 3.6         Right Arm   lbs 3.6   Goals: Start the hello fresh again to reduce eating out Have lunch   Diabetes Self-Management Education  Visit Type: First/Initial  Appt. Start Time: 4:16  Appt. End Time: 5:30  06/10/2023  [name], identified by name and date of birth, is a 52 y.o. female with a diagnosis of Diabetes: Type 2.   ASSESSMENT  Height 5' 3.5" (1.613 m), weight 170 lb 8 oz (77.3 kg). Body mass index is 29.73 kg/m.   Diabetes Self-Management Education - 06/10/23 1633       Visit Information   Visit Type First/Initial      Initial Visit   Diabetes Type Type 2    Are you currently following a meal plan? No    Are you taking your medications as prescribed? No      Health Coping   How would you rate your overall health? Good      Psychosocial Assessment   Patient Belief/Attitude about Diabetes Motivated to manage diabetes    What is the hardest part about your diabetes right now, causing you the most concern, or is the most worrisome to you about your diabetes?   Making healty food and beverage choices    Self-care barriers None     Self-management support Family    Patient Concerns Nutrition/Meal planning    Special Needs None    Preferred Learning Style Visual    Learning Readiness Change in progress    How often do you need to have someone help you when you read instructions, pamphlets, or other written materials from your doctor or pharmacy? 1 - Never      Pre-Education Assessment   Patient understands the diabetes disease and treatment process. Needs Instruction    Patient understands incorporating nutritional management into lifestyle. Needs Instruction    Patient undertands incorporating physical activity into lifestyle. Needs Instruction    Patient understands using medications safely. Needs Instruction    Patient understands monitoring blood glucose, interpreting and using results Needs Instruction    Patient understands prevention, detection, and treatment of acute complications. Needs Instruction    Patient understands prevention, detection, and treatment of chronic complications. Needs Instruction    Patient understands how to develop strategies to address psychosocial issues. Needs Instruction  Patient understands how to develop strategies to promote health/change behavior. Needs Instruction      Complications   Last HgB A1C per patient/outside source 6.5 %    How often do you check your blood sugar? 1-2 times/day    Fasting Blood glucose range (mg/dL) 16-073    Number of hypoglycemic episodes per month 0    Number of hyperglycemic episodes ( >200mg /dL): Never    Can you tell when your blood sugar is high? Yes    What do you do if your blood sugar is high? nothing    Have you had a dilated eye exam in the past 12 months? No    Have you had a dental exam in the past 12 months? No    Are you checking your feet? Yes    How many days per week are you checking your feet? 5      Dietary Intake   Breakfast skipped    Lunch 2pm: chips    Snack (afternoon) salomi snacks or dumplings    Dinner 5-6pm:  eaten out    Beverage(s) sweet tea, a little water      Activity / Exercise   Activity / Exercise Type ADL's    How many days per week do you exercise? 0    How many minutes per day do you exercise? 0    Total minutes per week of exercise 0      Patient Education   Previous Diabetes Education No    Disease Pathophysiology Factors that contribute to the development of diabetes    Healthy Eating Meal options for control of blood glucose level and chronic complications.;Information on hints to eating out and maintain blood glucose control.;Role of diet in the treatment of diabetes and the relationship between the three main macronutrients and blood glucose level;Food label reading, portion sizes and measuring food.;Plate Method    Being Active Role of exercise on diabetes management, blood pressure control and cardiac health.    Medications Reviewed patients medication for diabetes, action, purpose, timing of dose and side effects.    Monitoring Taught/evaluated SMBG meter.;Daily foot exams;Yearly dilated eye exam    Acute complications Taught prevention, symptoms, and  treatment of hypoglycemia - the 15 rule.;Discussed and identified patients' prevention, symptoms, and treatment of hyperglycemia.    Chronic complications Dental care;Retinopathy and reason for yearly dilated eye exams;Nephropathy, what it is, prevention of, the use of ACE, ARB's and early detection of through urine microalbumia.    Diabetes Stress and Support Role of stress on diabetes      Individualized Goals (developed by patient)   Nutrition Follow meal plan discussed;General guidelines for healthy choices and portions discussed    Physical Activity 30 minutes per day;Exercise 5-7 days per week    Monitoring  Test my blood glucose as discussed;Test blood glucose pre and post meals as discussed    Problem Solving Eating Pattern      Post-Education Assessment   Patient understands the diabetes disease and treatment process.  Demonstrates understanding / competency    Patient understands incorporating nutritional management into lifestyle. Demonstrates understanding / competency    Patient undertands incorporating physical activity into lifestyle. Demonstrates understanding / competency    Patient understands using medications safely. Demonstrates understanding / competency    Patient understands monitoring blood glucose, interpreting and using results Demonstrates understanding / competency    Patient understands prevention, detection, and treatment of acute complications. Demonstrates understanding / competency    Patient understands prevention,  detection, and treatment of chronic complications. Demonstrates understanding / competency    Patient understands how to develop strategies to address psychosocial issues. Demonstrates understanding / competency    Patient understands how to develop strategies to promote health/change behavior. Demonstrates understanding / competency      Outcomes   Expected Outcomes Demonstrated interest in learning. Expect positive outcomes    Future DMSE PRN    Program Status Completed             Individualized Plan for Diabetes Self-Management Training:   Learning Objective:  Patient will have a greater understanding of diabetes self-management. Patient education plan is to attend individual and/or group sessions per assessed needs and concerns.   Expected Outcomes:  Demonstrated interest in learning. Expect positive outcomes  Education material provided: ADA - How to Thrive: A Guide for Your Journey with Diabetes, Food label handouts, Meal plan card, My Plate, and Snack sheet  If problems or questions, patient to contact team via:  Phone and Email  Future DSME appointment: PRN

## 2023-06-16 ENCOUNTER — Telehealth: Payer: Self-pay

## 2023-06-16 NOTE — Telephone Encounter (Signed)
Pt LVM regarding pain in her L hand, pain in her lower abdominal area, heart burn, and pain in her L thigh. Pt states she has concerns with these symptoms and informed this RN that she was also previously taking metformin but stopped it a few months ago.  This RN attempted to call pt back with no answer and no VM box was set up. This RN called the pt's spouse with no answer but was able to leave a VM to please call back with the call back number [phone].

## 2023-06-22 ENCOUNTER — Ambulatory Visit: Payer: Managed Care, Other (non HMO)

## 2023-07-27 ENCOUNTER — Ambulatory Visit: Payer: Managed Care, Other (non HMO) | Attending: General Surgery

## 2023-07-27 DIAGNOSIS — Z483 Aftercare following surgery for neoplasm: Secondary | ICD-10-CM | POA: Insufficient documentation

## 2023-07-29 ENCOUNTER — Other Ambulatory Visit: Payer: Self-pay | Admitting: Hematology and Oncology

## 2023-08-10 ENCOUNTER — Inpatient Hospital Stay: Payer: Managed Care, Other (non HMO) | Attending: Hematology and Oncology | Admitting: Hematology and Oncology

## 2023-08-10 ENCOUNTER — Encounter: Payer: Self-pay | Admitting: *Deleted

## 2023-08-10 VITALS — BP 134/94 | HR 84 | Temp 97.7°F | Resp 18 | Ht 63.5 in | Wt 173.3 lb

## 2023-08-10 DIAGNOSIS — Z9221 Personal history of antineoplastic chemotherapy: Secondary | ICD-10-CM | POA: Insufficient documentation

## 2023-08-10 DIAGNOSIS — Z853 Personal history of malignant neoplasm of breast: Secondary | ICD-10-CM | POA: Diagnosis present

## 2023-08-10 DIAGNOSIS — Z171 Estrogen receptor negative status [ER-]: Secondary | ICD-10-CM

## 2023-08-10 DIAGNOSIS — C50412 Malignant neoplasm of upper-outer quadrant of left female breast: Secondary | ICD-10-CM

## 2023-08-10 DIAGNOSIS — Z9012 Acquired absence of left breast and nipple: Secondary | ICD-10-CM | POA: Insufficient documentation

## 2023-08-10 NOTE — Progress Notes (Signed)
Patient Care Team: Farris Has, MD as PCP - General (Family Medicine) Emelia Loron, MD as Consulting Physician (General Surgery) Serena Croissant, MD as Consulting Physician (Hematology and Oncology) Lonie Peak, MD as Attending Physician (Radiation Oncology)  DIAGNOSIS:  Encounter Diagnosis  Name Primary?   Malignant neoplasm of upper-outer quadrant of left breast in female, estrogen receptor negative (HCC) Yes    SUMMARY OF ONCOLOGIC HISTORY: Oncology History  Malignant neoplasm of upper-outer quadrant of left breast in female, estrogen receptor negative (HCC)  12/19/2020 Initial Diagnosis   Patient palpated a left breast mass and noted left axilla tenderness for one week. Diagnostic mammogram and US showed a conglomeration of masses in the left breast at the 2 o'clock position 3-4cm from the nipple spanning 3.9cm total, with a 0.7cm mass at the 2 o'clock position 6cm from the nipple, and two abnormal axillary lymph nodes. 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   12/26/2020 Cancer Staging   Staging form: Breast, AJCC 8th Edition - Clinical stage from 12/26/2020: Stage IIIA (cT3, cN1, cM0, G3, ER-, PR-, HER2+) - Signed by Serena Croissant, MD on 12/26/2020 Stage prefix: Initial diagnosis Histologic grading system: 3 grade system   01/08/2021 - 05/14/2021 Chemotherapy   TCHP x6 cycles followed by Herceptin Perjeta maintenance       01/18/2021 Genetic Testing   Negative genetic testing:  No pathogenic variants detected on the [facility] CancerNext-Expanded + RNAinsight panel. The report date is 01/18/2021.   The CancerNext-Expanded + RNAinsight gene panel offered by [employer] and includes sequencing and rearrangement analysis for the following 77 genes: AIP, ALK, APC, ATM, AXIN2, BAP1, BARD1, BLM, BMPR1A, BRCA1, BRCA2, BRIP1, CDC73, CDH1, CDK4, CDKN1B, CDKN2A, CHEK2, CTNNA1, DICER1, FANCC, FH, FLCN, GALNT12, KIF1B,  LZTR1, MAX, MEN1, MET, MLH1, MSH2, MSH3, MSH6, MUTYH, NBN, NF1, NF2, NTHL1, PALB2, PHOX2B, PMS2, POT1, PRKAR1A, PTCH1, PTEN, RAD51C, RAD51D, RB1, RECQL, RET, SDHA, SDHAF2, SDHB, SDHC, SDHD, SMAD4, SMARCA4, SMARCB1, SMARCE1, STK11, SUFU, TMEM127, TP53, TSC1, TSC2, VHL and XRCC2 (sequencing and deletion/duplication); EGFR, EGLN1, HOXB13, KIT, MITF, PDGFRA, POLD1 and POLE (sequencing only); EPCAM and GREM1 (deletion/duplication only). RNA data is routinely analyzed for use in variant interpretation for all genes.   06/04/2021 Surgery   left mastectomy: Dr. Dwain Sarna: Complete pathologic response, no residual cancer, 0/1 lymph node negative    06/22/2021 - 01/17/2022 Chemotherapy   Patient is on Treatment Plan : BREAST Trastuzumab + Pertuzumab q21d     07/16/2021 - 08/22/2021 Radiation Therapy   Site Technique Total Dose (Gy) Dose per Fx (Gy) Completed Fx Beam Energies  Chest Wall, Left: CW_Lt 3D 50.4/50.4 1.8 28/28 10X  Chest Wall, Left: CW_Lt_SCV_PAB 3D 50.4/50.4 1.8 28/28 6X, 10X       CHIEF COMPLIANT: Surveillance of breast cancer  Discussed the use of AI scribe software for clinical note transcription with the patient, who gave verbal consent to proceed.  History of Present Illness   The patient, a breast cancer survivor, presents two and a half years post-diagnosis and two years post-surgery. She reports a year-long history of phlegm production, which has recently resolved following administration of a Myers cocktail and a glutathione shot at a med spa. She also reports a dry cough. The patient experienced a heavy menstrual period for seven days following the glutathione shot in September, but has not had a period since. She also reports dryness, for which she sought IV hydration. The patient underwent a DIEP flap procedure on the  left side as part of her breast cancer treatment. She also reports having a new puppy, which she finds therapeutic.         ALLERGIES:  is allergic to  latex.  MEDICATIONS:  Current Outpatient Medications  Medication Sig Dispense Refill   acetaminophen (TYLENOL) 650 MG CR tablet Take 650 mg by mouth every 8 (eight) hours as needed for pain.     albuterol (VENTOLIN HFA) 108 (90 Base) MCG/ACT inhaler Inhale 2 puffs into the lungs every 6 (six) hours as needed for wheezing or shortness of breath. 1 each 1   amphetamine-dextroamphetamine (ADDERALL XR) 20 MG 24 hr capsule Take 25 mg by mouth daily as needed.     budesonide-formoterol (SYMBICORT) 80-4.5 MCG/ACT inhaler Inhale 2 puffs into the lungs 2 (two) times daily.     metFORMIN (GLUCOPHAGE) 500 MG tablet Take 500 mg by mouth 2 (two) times daily with a meal.     rosuvastatin (CRESTOR) 5 MG tablet Take 5 mg by mouth daily.     Vitamin D, Ergocalciferol, (DRISDOL) 1.25 MG (50000 UNIT) CAPS capsule TAKE 1 CAPSULE BY MOUTH ONE TIME PER WEEK 12 capsule 3   No current facility-administered medications for this visit.    PHYSICAL EXAMINATION: ECOG PERFORMANCE STATUS: 1 - Symptomatic but completely ambulatory  Vitals:   08/10/23 0957 08/10/23 0958  BP: (!) 136/100 (!) 134/94  Pulse: 84   Resp: 18   Temp: 97.7 F (36.5 C)   SpO2: 99%    Filed Weights   08/10/23 0957  Weight: 173 lb 4.8 oz (78.6 kg)    Physical Exam          (exam performed in the presence of a chaperone)  LABORATORY DATA:  I have reviewed the data as listed    Latest Ref Rng & Units 12/27/2021    8:31 AM 12/06/2021    9:24 AM 11/15/2021    8:59 AM  CMP  Glucose 70 - 99 mg/dL 409  811  914   BUN 6 - 20 mg/dL 18  15  16    Creatinine 0.44 - 1.00 mg/dL 7.82  9.56  2.13   Sodium 135 - 145 mmol/L 139  141  140   Potassium 3.5 - 5.1 mmol/L 3.8  3.9  4.0   Chloride 98 - 111 mmol/L 106  107  107   CO2 22 - 32 mmol/L 26  27  27    Calcium 8.9 - 10.3 mg/dL 9.5  9.4  9.3   Total Protein 6.5 - 8.1 g/dL 7.3  7.2  7.0   Total Bilirubin 0.3 - 1.2 mg/dL 0.3  0.3  0.2   Alkaline Phos 38 - 126 U/L 74  59  59   AST 15 - 41 U/L  14  14  13    ALT 0 - 44 U/L 14  16  13      Lab Results  Component Value Date   WBC 5.2 12/27/2021   HGB 12.1 12/27/2021   HCT 37.9 12/27/2021   MCV 87.9 12/27/2021   PLT 319 12/27/2021   NEUTROABS 3.5 12/27/2021    ASSESSMENT & PLAN:  Malignant neoplasm of upper-outer quadrant of left breast in female, estrogen receptor negative (HCC) 12/19/2020: Palpable left breast mass and left axillary tenderness.  Mammogram revealed conglomeration of masses 3.9 cm and an adjacent 0.7 cm mass and 2 axillary lymph nodes that were abnormal. Biopsy revealed IDC with DCIS, grade 3, ER/PR negative, HER-2 +3+ by [facility]   12/24/2020: Breast  MRI revealed mass or non-mass enhancement measuring 9.5 cm and 3 abnormal lymph nodes 01-02-21: Bone scan: No bone mets 01/01/2021: CT CAP: No distant metastatic disease   Treatment Plan: 1. Neoadjuvant chemotherapy with TCH Perjeta 6 cycles followed by Herceptin Perjeta maintenance completed 01/17/2022 2. left mastectomy 06/04/2021: Dr. Dwain Sarna: Complete pathologic response, no residual cancer, 0/1 lymph node negative, D IEP flap left breast at [facility] 3. Followed by adjuvant radiation therapy completed 08/22/2021 -------------------------------------------------------------------------------------------------------------------------------- Breast cancer surveillance: Right breast mammogram 03/20/2023: Benign breast density category C Breast exam 08/02/2023: Benign  Recommend doing guardant for minimal residual disease Return to clinic in 1 year for follow-up with Mardella Layman ------------------------------------- Assessment and Plan    Breast Cancer Two and a half years post-diagnosis and two years post-surgery. Recent mammogram results were good. The cancer was estrogen negative, so no need for hormonal therapy. Patient underwent DIEP flap reconstruction on the left side. -Continue annual mammograms in June and physical exams towards the end of the year. -Set up Guardant  360 blood test for minimal residual disease detection every six months.  Phlegm/Dry Cough Chronic phlegm resolved after receiving Myers cocktail and glutathione shot at a med spa. A dry cough persists. -No specific plan discussed.  Menstrual Irregularities Heavy period for seven days in September following glutathione shot. No menstruation since then. -No specific plan discussed.          No orders of the defined types were placed in this encounter.  The patient has a good understanding of the overall plan. she agrees with it. she will call with any problems that may develop before the next visit here. Total time spent: 30 mins including face to face time and time spent for planning, charting and co-ordination of care   Tamsen Meek, MD 08/10/23

## 2023-08-10 NOTE — Progress Notes (Signed)
Per MD request RN successfully faxed Guardant reveal orders to [phone]

## 2023-08-10 NOTE — Assessment & Plan Note (Addendum)
12/19/2020: Palpable left breast mass and left axillary tenderness.  Mammogram revealed conglomeration of masses 3.9 cm and an adjacent 0.7 cm mass and 2 axillary lymph nodes that were abnormal. Biopsy revealed IDC with DCIS, grade 3, ER/PR negative, HER-2 +3+ by [facility]   12/24/2020: Breast MRI revealed mass or non-mass enhancement measuring 9.5 cm and 3 abnormal lymph nodes 01-02-21: Bone scan: No bone mets 01/01/2021: CT CAP: No distant metastatic disease   Treatment Plan: 1. Neoadjuvant chemotherapy with TCH Perjeta 6 cycles followed by Herceptin Perjeta maintenance completed 01/17/2022 2. left mastectomy 06/04/2021: Dr. Dwain Sarna: Complete pathologic response, no residual cancer, 0/1 lymph node negative, D IEP flap left breast at [facility] 3. Followed by adjuvant radiation therapy completed 08/22/2021 -------------------------------------------------------------------------------------------------------------------------------- Breast cancer surveillance: Right breast mammogram 03/20/2023: Benign breast density category C Breast exam 08/02/2023: Benign  Recommend doing guardant for minimal residual disease Return to clinic in 1 year for follow-up with Mardella Layman

## 2023-08-21 ENCOUNTER — Telehealth: Payer: Self-pay

## 2023-08-21 NOTE — Telephone Encounter (Signed)
Called pt per MD to advise Guardant Reveal testing was negative/not detected. Pt verbalized understanding of results and knows [facility] will be in touch to schedule 6 mo repeat lab.

## 2023-08-25 ENCOUNTER — Encounter: Payer: Self-pay | Admitting: Hematology and Oncology

## 2024-02-17 ENCOUNTER — Telehealth: Payer: Self-pay

## 2024-02-17 NOTE — Telephone Encounter (Signed)
 Attempted to call pt regarding guardant reveal results lvm that results were negative and for pt to return call back and if you have any questions or concerns.

## 2024-02-18 ENCOUNTER — Encounter: Payer: Self-pay | Admitting: Hematology and Oncology

## 2024-07-19 ENCOUNTER — Other Ambulatory Visit: Payer: Self-pay | Admitting: Family Medicine

## 2024-07-19 DIAGNOSIS — Z1231 Encounter for screening mammogram for malignant neoplasm of breast: Secondary | ICD-10-CM

## 2024-07-28 ENCOUNTER — Ambulatory Visit
Admission: RE | Admit: 2024-07-28 | Discharge: 2024-07-28 | Disposition: A | Discharge status: Home / Self Care | Source: Ambulatory Visit | Attending: Family Medicine | Admitting: Family Medicine

## 2024-07-28 DIAGNOSIS — Z1231 Encounter for screening mammogram for malignant neoplasm of breast: Secondary | ICD-10-CM

## 2024-08-11 ENCOUNTER — Inpatient Hospital Stay

## 2024-08-11 ENCOUNTER — Inpatient Hospital Stay: Payer: Managed Care, Other (non HMO) | Attending: Hematology and Oncology | Admitting: Hematology and Oncology

## 2024-08-11 VITALS — BP 132/100 | HR 107 | Temp 98.2°F | Resp 18 | Ht 63.5 in | Wt 164.9 lb

## 2024-08-11 DIAGNOSIS — Z9012 Acquired absence of left breast and nipple: Secondary | ICD-10-CM | POA: Insufficient documentation

## 2024-08-11 DIAGNOSIS — Z9221 Personal history of antineoplastic chemotherapy: Secondary | ICD-10-CM | POA: Diagnosis not present

## 2024-08-11 DIAGNOSIS — Z171 Estrogen receptor negative status [ER-]: Secondary | ICD-10-CM | POA: Diagnosis not present

## 2024-08-11 DIAGNOSIS — R5383 Other fatigue: Secondary | ICD-10-CM

## 2024-08-11 DIAGNOSIS — W1809XA Striking against other object with subsequent fall, initial encounter: Secondary | ICD-10-CM | POA: Diagnosis not present

## 2024-08-11 DIAGNOSIS — Z853 Personal history of malignant neoplasm of breast: Secondary | ICD-10-CM | POA: Insufficient documentation

## 2024-08-11 DIAGNOSIS — R519 Headache, unspecified: Secondary | ICD-10-CM | POA: Insufficient documentation

## 2024-08-11 DIAGNOSIS — C50412 Malignant neoplasm of upper-outer quadrant of left female breast: Secondary | ICD-10-CM | POA: Diagnosis not present

## 2024-08-11 DIAGNOSIS — M791 Myalgia, unspecified site: Secondary | ICD-10-CM | POA: Diagnosis present

## 2024-08-11 LAB — CBC WITH DIFFERENTIAL (CANCER CENTER ONLY)
Abs Immature Granulocytes: 0.01 K/uL (ref 0.00–0.07)
Basophils Absolute: 0 K/uL (ref 0.0–0.1)
Basophils Relative: 0 %
Eosinophils Absolute: 0 K/uL (ref 0.0–0.5)
Eosinophils Relative: 0 %
HCT: 38.5 % (ref 36.0–46.0)
Hemoglobin: 12.8 g/dL (ref 12.0–15.0)
Immature Granulocytes: 0 %
Lymphocytes Relative: 27 %
Lymphs Abs: 2.1 K/uL (ref 0.7–4.0)
MCH: 29.1 pg (ref 26.0–34.0)
MCHC: 33.2 g/dL (ref 30.0–36.0)
MCV: 87.5 fL (ref 80.0–100.0)
Monocytes Absolute: 0.4 K/uL (ref 0.1–1.0)
Monocytes Relative: 5 %
Neutro Abs: 5.1 K/uL (ref 1.7–7.7)
Neutrophils Relative %: 68 %
Platelet Count: 300 K/uL (ref 150–400)
RBC: 4.4 MIL/uL (ref 3.87–5.11)
RDW: 13 % (ref 11.5–15.5)
WBC Count: 7.6 K/uL (ref 4.0–10.5)
nRBC: 0 % (ref 0.0–0.2)

## 2024-08-11 LAB — IRON AND IRON BINDING CAPACITY (CC-WL,HP ONLY)
Iron: 53 ug/dL (ref 28–170)
Saturation Ratios: 14 % (ref 10.4–31.8)
TIBC: 385 ug/dL (ref 250–450)
UIBC: 332 ug/dL (ref 148–442)

## 2024-08-11 LAB — CMP (CANCER CENTER ONLY)
ALT: 16 U/L (ref 0–44)
AST: 16 U/L (ref 15–41)
Albumin: 4.6 g/dL (ref 3.5–5.0)
Alkaline Phosphatase: 62 U/L (ref 38–126)
Anion gap: 9 (ref 5–15)
BUN: 11 mg/dL (ref 6–20)
CO2: 23 mmol/L (ref 22–32)
Calcium: 9.4 mg/dL (ref 8.9–10.3)
Chloride: 108 mmol/L (ref 98–111)
Creatinine: 0.73 mg/dL (ref 0.44–1.00)
GFR, Estimated: >60 mL/min (ref >60)
Glucose, Bld: 99 mg/dL (ref 70–99)
Potassium: 3.9 mmol/L (ref 3.5–5.1)
Sodium: 140 mmol/L (ref 135–145)
Total Bilirubin: 0.4 mg/dL (ref 0.0–1.2)
Total Protein: 7.3 g/dL (ref 6.5–8.1)

## 2024-08-11 LAB — FERRITIN: Ferritin: 76 ng/mL (ref 11–307)

## 2024-08-11 NOTE — Progress Notes (Signed)
 Patient Care Team: Kip Righter, MD as PCP - General (Family Medicine) Ebbie Cough, MD as Consulting Physician (General Surgery) Odean Potts, MD as Consulting Physician (Hematology and Oncology) Izell Domino, MD as Attending Physician (Radiation Oncology)  DIAGNOSIS:  Encounter Diagnosis  Name Primary?   Malignant neoplasm of upper-outer quadrant of left breast in female, estrogen receptor negative (HCC) Yes    SUMMARY OF ONCOLOGIC HISTORY: Oncology History  Malignant neoplasm of upper-outer quadrant of left breast in female, estrogen receptor negative (HCC)  12/19/2020 Initial Diagnosis   Patient palpated a left breast mass and noted left axilla tenderness for one week. Diagnostic mammogram and US  showed a conglomeration of masses in the left breast at the 2 o'clock position 3-4cm from the nipple spanning 3.9cm total, with a 0.7cm mass at the 2 o'clock position 6cm from the nipple, and two abnormal axillary lymph nodes. 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   12/26/2020 Cancer Staging   Staging form: Breast, AJCC 8th Edition - Clinical stage from 12/26/2020: Stage IIIA (cT3, cN1, cM0, G3, ER-, PR-, HER2+) - Signed by Odean Potts, MD on 12/26/2020 Stage prefix: Initial diagnosis Histologic grading system: 3 grade system   01/08/2021 - 05/14/2021 Chemotherapy   TCHP x6 cycles followed by Herceptin  Perjeta  maintenance       01/18/2021 Genetic Testing   Negative genetic testing:  No pathogenic variants detected on the [employer] CancerNext-Expanded + RNAinsight panel. The report date is 01/18/2021.   The CancerNext-Expanded + RNAinsight gene panel offered by [employer] and includes sequencing and rearrangement analysis for the following 77 genes: AIP, ALK, APC, ATM, AXIN2, BAP1, BARD1, BLM, BMPR1A, BRCA1, BRCA2, BRIP1, CDC73, CDH1, CDK4, CDKN1B, CDKN2A, CHEK2, CTNNA1, DICER1, FANCC, FH, FLCN, GALNT12, KIF1B,  LZTR1, MAX, MEN1, MET, MLH1, MSH2, MSH3, MSH6, MUTYH, NBN, NF1, NF2, NTHL1, PALB2, PHOX2B, PMS2, POT1, PRKAR1A, PTCH1, PTEN, RAD51C, RAD51D, RB1, RECQL, RET, SDHA, SDHAF2, SDHB, SDHC, SDHD, SMAD4, SMARCA4, SMARCB1, SMARCE1, STK11, SUFU, TMEM127, TP53, TSC1, TSC2, VHL and XRCC2 (sequencing and deletion/duplication); EGFR, EGLN1, HOXB13, KIT, MITF, PDGFRA, POLD1 and POLE (sequencing only); EPCAM and GREM1 (deletion/duplication only). RNA data is routinely analyzed for use in variant interpretation for all genes.   06/04/2021 Surgery   left mastectomy: Dr. Ebbie: Complete pathologic response, no residual cancer, 0/1 lymph node negative    06/22/2021 - 01/17/2022 Chemotherapy   Patient is on Treatment Plan : BREAST Trastuzumab  + Pertuzumab  q21d     07/16/2021 - 08/22/2021 Radiation Therapy   Site Technique Total Dose (Gy) Dose per Fx (Gy) Completed Fx Beam Energies  Chest Wall, Left: CW_Lt 3D 50.4/50.4 1.8 28/28 10X  Chest Wall, Left: CW_Lt_SCV_PAB 3D 50.4/50.4 1.8 28/28 6X, 10X       CHIEF COMPLIANT: Surveillance of breast cancer  HISTORY OF PRESENT ILLNESS:   History of Present Illness Maria Kennedy is a 53 year old female with a history of breast cancer who presents with body aches following a fall.  She fell last Friday, tripping over a small metal fence indoors and landing on a carpeted wood floor. There was no loss of consciousness, but she felt drowsy immediately after. She experiences headaches, which she attributes to her blood pressure, and uses Tylenol  and Epsom salt baths for relief. The body aches have lessened but persist at night.  She underwent chemotherapy and surgery for breast cancer in 2022, followed by reconstruction surgery. She continues regular monitoring with mammograms and [facility] Reveal tests, which have  been negative. Her keloid skin affects her surgical scars.  She is concerned about low iron levels, as she has not felt well since the fall. There has been no recent  blood work to assess hemoglobin or iron levels. She takes vitamin D  supplements but is unsure of her current levels.     ALLERGIES:  is allergic to latex.  MEDICATIONS:  Current Outpatient Medications  Medication Sig Dispense Refill   acetaminophen  (TYLENOL ) 650 MG CR tablet Take 650 mg by mouth every 8 (eight) hours as needed for pain.     albuterol  (VENTOLIN  HFA) 108 (90 Base) MCG/ACT inhaler Inhale 2 puffs into the lungs every 6 (six) hours as needed for wheezing or shortness of breath. 1 each 1   amphetamine-dextroamphetamine (ADDERALL XR) 20 MG 24 hr capsule Take 25 mg by mouth daily as needed.     metFORMIN (GLUCOPHAGE) 500 MG tablet Take 500 mg by mouth 2 (two) times daily with a meal.     rosuvastatin (CRESTOR) 5 MG tablet Take 5 mg by mouth daily.     Vitamin D , Ergocalciferol , (DRISDOL ) 1.25 MG (50000 UNIT) CAPS capsule TAKE 1 CAPSULE BY MOUTH ONE TIME PER WEEK 12 capsule 3   No current facility-administered medications for this visit.    PHYSICAL EXAMINATION: ECOG PERFORMANCE STATUS: 1 - Symptomatic but completely ambulatory  Vitals:   08/11/24 1005  BP: (!) 132/100  Pulse: (!) 107  Resp: 18  Temp: 98.2 F (36.8 C)  SpO2: 100%   Filed Weights   08/11/24 1005  Weight: 164 lb 14.4 oz (74.8 kg)    Physical Exam BREAST: No palpable lumps or nodules of concern.  Left breast DIEP flap reconstruction looks excellent.  (exam performed in the presence of a chaperone)  LABORATORY DATA:  I have reviewed the data as listed    Latest Ref Rng & Units 12/27/2021    8:31 AM 12/06/2021    9:24 AM 11/15/2021    8:59 AM  CMP  Glucose 70 - 99 mg/dL 772  883  875   BUN 6 - 20 mg/dL 18  15  16    Creatinine 0.44 - 1.00 mg/dL 9.03  9.15  9.19   Sodium 135 - 145 mmol/L 139  141  140   Potassium 3.5 - 5.1 mmol/L 3.8  3.9  4.0   Chloride 98 - 111 mmol/L 106  107  107   CO2 22 - 32 mmol/L 26  27  27    Calcium 8.9 - 10.3 mg/dL 9.5  9.4  9.3   Total Protein 6.5 - 8.1 g/dL 7.3  7.2   7.0   Total Bilirubin 0.3 - 1.2 mg/dL 0.3  0.3  0.2   Alkaline Phos 38 - 126 U/L 74  59  59   AST 15 - 41 U/L 14  14  13    ALT 0 - 44 U/L 14  16  13      Lab Results  Component Value Date   WBC 5.2 12/27/2021   HGB 12.1 12/27/2021   HCT 37.9 12/27/2021   MCV 87.9 12/27/2021   PLT 319 12/27/2021   NEUTROABS 3.5 12/27/2021    ASSESSMENT & PLAN:  Malignant neoplasm of upper-outer quadrant of left breast in female, estrogen receptor negative (HCC) 12/19/2020: Palpable left breast mass and left axillary tenderness.  Mammogram revealed conglomeration of masses 3.9 cm and an adjacent 0.7 cm mass and 2 axillary lymph nodes that were abnormal. Biopsy revealed IDC with DCIS, grade 3, ER/PR  negative, HER-2 +3+ by IHC   12/24/2020: Breast MRI revealed mass or non-mass enhancement measuring 9.5 cm and 3 abnormal lymph nodes 01-02-21: Bone scan: No bone mets 01/01/2021: CT CAP: No distant metastatic disease   Treatment Plan: 1. Neoadjuvant chemotherapy with [facility] Perjeta  6 cycles followed by Herceptin  Perjeta  maintenance completed 01/17/2022 2. left mastectomy 06/04/2021: Dr. Ebbie: Complete pathologic response, no residual cancer, 0/1 lymph node negative, D IEP flap left breast at [facility] 3. Followed by adjuvant radiation therapy completed 08/22/2021 -------------------------------------------------------------------------------------------------------------------------------- Breast cancer surveillance: Right breast mammogram 03/20/2023: Benign breast density category C Breast exam 08/11/2024: Benign [facility]ant reveal MRD testing: Negative  Recent fall without any major injuries.  Since that time she has been achy all over severe fatigue: We will check CBC CMP and iron studies today.  We will call her with the results if there are any abnormalities. Return to clinic in 1 year for follow-up  No orders of the defined types were placed in this encounter.  The patient has a good understanding of the  overall plan. she agrees with it. she will call with any problems that may develop before the next visit here.  I personally spent a total of 30 minutes in the care of the patient today including preparing to see the patient, getting/reviewing separately obtained history, performing a medically appropriate exam/evaluation, counseling and educating, placing orders, referring and communicating with other health care professionals, documenting clinical information in the EHR, independently interpreting results, communicating results, and coordinating care.   Viinay K Antoine Vandermeulen, MD 08/11/24

## 2024-08-11 NOTE — Assessment & Plan Note (Signed)
 12/19/2020: Palpable left breast mass and left axillary tenderness.  Mammogram revealed conglomeration of masses 3.9 cm and an adjacent 0.7 cm mass and 2 axillary lymph nodes that were abnormal. Biopsy revealed IDC with DCIS, grade 3, ER/PR negative, HER-2 +3+ by IHC   12/24/2020: Breast MRI revealed mass or non-mass enhancement measuring 9.5 cm and 3 abnormal lymph nodes 01-02-21: Bone scan: No bone mets 01/01/2021: CT CAP: No distant metastatic disease   Treatment Plan: 1. Neoadjuvant chemotherapy with [facility] Perjeta  6 cycles followed by Herceptin  Perjeta  maintenance completed 01/17/2022 2. left mastectomy 06/04/2021: Dr. Ebbie: Complete pathologic response, no residual cancer, 0/1 lymph node negative, D IEP flap left breast at [facility] 3. Followed by adjuvant radiation therapy completed 08/22/2021 -------------------------------------------------------------------------------------------------------------------------------- Breast cancer surveillance: Right breast mammogram 03/20/2023: Benign breast density category C Breast exam 08/11/2024: Benign Guardant reveal MRD testing: Negative  Return to clinic in 1 year for follow-up

## 2024-08-17 ENCOUNTER — Telehealth: Payer: Self-pay

## 2024-08-17 NOTE — Telephone Encounter (Signed)
 Called pt per MD to advise Guardant testing was negative/not detected. Pt verbalized understanding of results and knows [facility] will be in touch to schedule 6 mo repeat lab.

## 2024-08-19 ENCOUNTER — Encounter: Payer: Self-pay | Admitting: Hematology and Oncology

## 2024-08-27 ENCOUNTER — Other Ambulatory Visit: Payer: Self-pay | Admitting: Hematology and Oncology

## 2025-08-14 ENCOUNTER — Inpatient Hospital Stay: Admitting: Hematology and Oncology
